# Patient Record
Sex: Female | Born: 1945 | Hispanic: No | Marital: Married | Smoking: Never smoker
Health system: Southern US, Community
[De-identification: ages and names within clinical notes are randomized; demographics above are authoritative.]

## PROBLEM LIST (undated history)

## (undated) DIAGNOSIS — I10 Essential (primary) hypertension: Secondary | ICD-10-CM

## (undated) DIAGNOSIS — Z973 Presence of spectacles and contact lenses: Secondary | ICD-10-CM

## (undated) DIAGNOSIS — Z9889 Other specified postprocedural states: Secondary | ICD-10-CM

## (undated) DIAGNOSIS — M1712 Unilateral primary osteoarthritis, left knee: Secondary | ICD-10-CM

## (undated) DIAGNOSIS — T148XXA Other injury of unspecified body region, initial encounter: Secondary | ICD-10-CM

## (undated) DIAGNOSIS — E1159 Type 2 diabetes mellitus with other circulatory complications: Secondary | ICD-10-CM

## (undated) DIAGNOSIS — Z923 Personal history of irradiation: Secondary | ICD-10-CM

## (undated) DIAGNOSIS — N1831 Chronic kidney disease, stage 3a: Secondary | ICD-10-CM

## (undated) DIAGNOSIS — R112 Nausea with vomiting, unspecified: Secondary | ICD-10-CM

## (undated) DIAGNOSIS — R600 Localized edema: Secondary | ICD-10-CM

## (undated) DIAGNOSIS — F411 Generalized anxiety disorder: Secondary | ICD-10-CM

## (undated) DIAGNOSIS — C50919 Malignant neoplasm of unspecified site of unspecified female breast: Secondary | ICD-10-CM

## (undated) DIAGNOSIS — I2699 Other pulmonary embolism without acute cor pulmonale: Secondary | ICD-10-CM

## (undated) DIAGNOSIS — Z86711 Personal history of pulmonary embolism: Secondary | ICD-10-CM

## (undated) DIAGNOSIS — J189 Pneumonia, unspecified organism: Secondary | ICD-10-CM

## (undated) DIAGNOSIS — E7800 Pure hypercholesterolemia, unspecified: Secondary | ICD-10-CM

## (undated) DIAGNOSIS — G4730 Sleep apnea, unspecified: Secondary | ICD-10-CM

## (undated) DIAGNOSIS — M1711 Unilateral primary osteoarthritis, right knee: Secondary | ICD-10-CM

## (undated) DIAGNOSIS — G510 Bell's palsy: Secondary | ICD-10-CM

## (undated) DIAGNOSIS — E119 Type 2 diabetes mellitus without complications: Secondary | ICD-10-CM

## (undated) DIAGNOSIS — E079 Disorder of thyroid, unspecified: Secondary | ICD-10-CM

## (undated) DIAGNOSIS — Z972 Presence of dental prosthetic device (complete) (partial): Secondary | ICD-10-CM

## (undated) HISTORY — DX: Personal history of irradiation: Z92.3

## (undated) HISTORY — DX: Sleep apnea, unspecified: G47.30

## (undated) HISTORY — DX: Other pulmonary embolism without acute cor pulmonale: I26.99

## (undated) HISTORY — DX: Pure hypercholesterolemia, unspecified: E78.00

## (undated) HISTORY — DX: Disorder of thyroid, unspecified: E07.9

## (undated) HISTORY — DX: Bell's palsy: G51.0

## (undated) HISTORY — DX: Personal history of pulmonary embolism: Z86.711

## (undated) HISTORY — DX: Unilateral primary osteoarthritis, right knee: M17.11

## (undated) HISTORY — DX: Malignant neoplasm of unspecified site of unspecified female breast: C50.919

## (undated) HISTORY — DX: Essential (primary) hypertension: I10

## (undated) HISTORY — DX: Type 2 diabetes mellitus without complications: E11.9

## (undated) HISTORY — DX: Pneumonia, unspecified organism: J18.9

---

## 1979-04-06 HISTORY — PX: ABDOMINAL HYSTERECTOMY: SHX81

## 1999-11-20 ENCOUNTER — Ambulatory Visit (HOSPITAL_COMMUNITY): Admission: RE | Admit: 1999-11-20 | Discharge: 1999-11-20 | Payer: Self-pay | Admitting: Internal Medicine

## 1999-11-20 ENCOUNTER — Encounter: Payer: Self-pay | Admitting: Internal Medicine

## 1999-12-02 ENCOUNTER — Encounter: Payer: Self-pay | Admitting: *Deleted

## 1999-12-02 ENCOUNTER — Inpatient Hospital Stay (HOSPITAL_COMMUNITY): Admission: EM | Admit: 1999-12-02 | Discharge: 1999-12-03 | Payer: Self-pay | Admitting: *Deleted

## 1999-12-03 ENCOUNTER — Encounter: Payer: Self-pay | Admitting: *Deleted

## 2000-11-17 ENCOUNTER — Inpatient Hospital Stay (HOSPITAL_COMMUNITY): Admission: AD | Admit: 2000-11-17 | Discharge: 2000-11-23 | Payer: Self-pay | Admitting: Internal Medicine

## 2000-11-17 ENCOUNTER — Encounter: Payer: Self-pay | Admitting: Internal Medicine

## 2000-11-17 ENCOUNTER — Encounter: Admission: RE | Admit: 2000-11-17 | Discharge: 2000-11-17 | Payer: Self-pay | Admitting: Internal Medicine

## 2003-04-09 ENCOUNTER — Encounter: Admission: RE | Admit: 2003-04-09 | Discharge: 2003-07-08 | Payer: Self-pay | Admitting: Internal Medicine

## 2003-11-29 ENCOUNTER — Ambulatory Visit (HOSPITAL_COMMUNITY): Admission: RE | Admit: 2003-11-29 | Discharge: 2003-11-29 | Payer: Self-pay | Admitting: Internal Medicine

## 2004-06-10 ENCOUNTER — Ambulatory Visit: Payer: Self-pay | Admitting: Physical Medicine & Rehabilitation

## 2004-06-10 ENCOUNTER — Inpatient Hospital Stay (HOSPITAL_COMMUNITY): Admission: RE | Admit: 2004-06-10 | Discharge: 2004-06-16 | Payer: Self-pay | Admitting: Orthopedic Surgery

## 2004-06-16 ENCOUNTER — Inpatient Hospital Stay (HOSPITAL_COMMUNITY)
Admission: RE | Admit: 2004-06-16 | Discharge: 2004-06-26 | Payer: Self-pay | Admitting: Physical Medicine & Rehabilitation

## 2004-07-06 ENCOUNTER — Inpatient Hospital Stay (HOSPITAL_COMMUNITY): Admission: EM | Admit: 2004-07-06 | Discharge: 2004-07-11 | Payer: Self-pay | Admitting: Emergency Medicine

## 2004-07-07 ENCOUNTER — Encounter (INDEPENDENT_AMBULATORY_CARE_PROVIDER_SITE_OTHER): Payer: Self-pay | Admitting: Cardiology

## 2005-01-21 ENCOUNTER — Ambulatory Visit (HOSPITAL_COMMUNITY): Admission: RE | Admit: 2005-01-21 | Discharge: 2005-01-21 | Payer: Self-pay | Admitting: Family Medicine

## 2005-04-05 DIAGNOSIS — Z86711 Personal history of pulmonary embolism: Secondary | ICD-10-CM

## 2005-04-05 HISTORY — DX: Personal history of pulmonary embolism: Z86.711

## 2005-04-05 HISTORY — PX: KNEE ARTHROPLASTY: SHX992

## 2005-04-26 ENCOUNTER — Ambulatory Visit (HOSPITAL_COMMUNITY): Admission: AD | Admit: 2005-04-26 | Discharge: 2005-04-28 | Payer: Self-pay | Admitting: Orthopedic Surgery

## 2005-05-10 ENCOUNTER — Encounter: Admission: RE | Admit: 2005-05-10 | Discharge: 2005-08-08 | Payer: Self-pay | Admitting: Orthopedic Surgery

## 2006-01-28 ENCOUNTER — Ambulatory Visit (HOSPITAL_COMMUNITY): Admission: RE | Admit: 2006-01-28 | Discharge: 2006-01-28 | Payer: Self-pay | Admitting: Family Medicine

## 2007-01-31 ENCOUNTER — Ambulatory Visit (HOSPITAL_COMMUNITY): Admission: RE | Admit: 2007-01-31 | Discharge: 2007-01-31 | Payer: Self-pay | Admitting: Family Medicine

## 2008-04-05 HISTORY — PX: THYROIDECTOMY: SHX17

## 2008-12-24 ENCOUNTER — Encounter (HOSPITAL_COMMUNITY): Admission: RE | Admit: 2008-12-24 | Discharge: 2009-03-07 | Payer: Self-pay | Admitting: Family Medicine

## 2008-12-31 ENCOUNTER — Encounter: Admission: RE | Admit: 2008-12-31 | Discharge: 2008-12-31 | Payer: Self-pay | Admitting: Family Medicine

## 2009-01-17 ENCOUNTER — Encounter: Admission: RE | Admit: 2009-01-17 | Discharge: 2009-01-17 | Payer: Self-pay | Admitting: Family Medicine

## 2009-01-22 ENCOUNTER — Encounter (INDEPENDENT_AMBULATORY_CARE_PROVIDER_SITE_OTHER): Payer: Self-pay | Admitting: Interventional Radiology

## 2009-01-22 ENCOUNTER — Other Ambulatory Visit: Admission: RE | Admit: 2009-01-22 | Discharge: 2009-01-22 | Payer: Self-pay | Admitting: Interventional Radiology

## 2009-01-22 ENCOUNTER — Encounter: Admission: RE | Admit: 2009-01-22 | Discharge: 2009-01-22 | Payer: Self-pay | Admitting: Endocrinology

## 2009-02-19 ENCOUNTER — Ambulatory Visit (HOSPITAL_COMMUNITY): Admission: RE | Admit: 2009-02-19 | Discharge: 2009-02-19 | Payer: Self-pay | Admitting: Family Medicine

## 2009-05-13 ENCOUNTER — Encounter (INDEPENDENT_AMBULATORY_CARE_PROVIDER_SITE_OTHER): Payer: Self-pay | Admitting: Surgery

## 2009-05-13 ENCOUNTER — Ambulatory Visit (HOSPITAL_COMMUNITY): Admission: RE | Admit: 2009-05-13 | Discharge: 2009-05-14 | Payer: Self-pay | Admitting: Surgery

## 2010-01-08 ENCOUNTER — Encounter: Admission: RE | Admit: 2010-01-08 | Discharge: 2010-01-08 | Payer: Self-pay | Admitting: Orthopedic Surgery

## 2010-06-24 LAB — GLUCOSE, CAPILLARY
Glucose-Capillary: 126 mg/dL — ABNORMAL HIGH (ref 70–99)
Glucose-Capillary: 139 mg/dL — ABNORMAL HIGH (ref 70–99)

## 2010-06-24 LAB — DIFFERENTIAL
Eosinophils Absolute: 0.1 10*3/uL (ref 0.0–0.7)
Lymphs Abs: 2 10*3/uL (ref 0.7–4.0)
Monocytes Relative: 9 % (ref 3–12)
Neutro Abs: 3.3 10*3/uL (ref 1.7–7.7)
Neutrophils Relative %: 55 % (ref 43–77)

## 2010-06-24 LAB — URINALYSIS, ROUTINE W REFLEX MICROSCOPIC
Ketones, ur: 15 mg/dL — AB
Nitrite: NEGATIVE
Specific Gravity, Urine: 1.031 — ABNORMAL HIGH (ref 1.005–1.030)
Urobilinogen, UA: 1 mg/dL (ref 0.0–1.0)
pH: 5.5 (ref 5.0–8.0)

## 2010-06-24 LAB — PROTIME-INR
INR: 1.11 (ref 0.00–1.49)
Prothrombin Time: 14.2 seconds (ref 11.6–15.2)

## 2010-06-24 LAB — BASIC METABOLIC PANEL
BUN: 19 mg/dL (ref 6–23)
Calcium: 9.7 mg/dL (ref 8.4–10.5)
Creatinine, Ser: 0.68 mg/dL (ref 0.4–1.2)
GFR calc Af Amer: >60 mL/min (ref >60)
GFR calc non Af Amer: >60 mL/min (ref >60)

## 2010-06-24 LAB — CBC
Platelets: 189 10*3/uL (ref 150–400)
RBC: 4.84 MIL/uL (ref 3.87–5.11)
WBC: 5.9 10*3/uL (ref 4.0–10.5)

## 2010-06-24 LAB — CALCIUM: Calcium: 8.4 mg/dL (ref 8.4–10.5)

## 2010-08-21 NOTE — Consult Note (Signed)
[name], [name]                ACCOUNT NO.:  [id number]   MEDICAL RECORD NO.:  [id number]          PATIENT TYPE:  INP   LOCATION:  5009                         FACILITY:  [facility]   PHYSICIAN:  Sherin Quarry, MD      DATE OF BIRTH:  [birth year]   DATE OF CONSULTATION:  DATE OF DISCHARGE:                                   CONSULTATION   DATE OF CONSULTATION:  June 13, 2004.   HISTORY OF PRESENT ILLNESS:  [name] is a morbidly obese, 65 year old  lady, who underwent left total knee replacement on March 8th by Dr. Thurston Hole.  Postoperatively, she has been maintained on the standard protocol, which  includes Coumadin prophylaxis.  Dr. Thurston Hole had also placed her on Cipro 500  mg b.i.d. for a three day period.  Her postoperative course has been  noteworthy for fever, which began on March 9th and reached 102 degrees.  She  had another spike on March 10th at noon of 102 degrees.  Subsequently, her  temperature has been down and several determinations have been less than  100.  In addition, she appears to be mildly hypoxic with O2 saturations in  the range of 90 to 92%.  Her main complaints are that she feels somewhat  weak.  She feels as though her heart is beating faster than usual.  She  also, of course, is experiencing pain in her left knee.  Concerns are that  we are requested to evaluate her blood sugar and also her hyponatremia.  Her  most recent serum sodium level was 126.   PAST MEDICAL HISTORY:   MEDICATIONS:  According to her husband, her usual medications are:  1.  Actos 30 mg daily.  2.  Zocor 40 mg daily.  3.  Glucophage 500 mg probably once daily.  4.  Avalide 150/12.5 one daily.   ALLERGIES:  There are no known drug allergies.   MEDICAL ILLNESSES:  1.  Hypertension:  The patient apparently has had hypertension over the last      three years.  2.  History of chest pain:  The patient was hospitalized in 2001 with      complaints of chest pain.  A stress test was done  at that time, which      was negative.  3.  Diabetes:  The patient has a one year history of diabetes, which is said      to be well regulated.  4.  Probable sleep apnea:  The patient's husband describes a history of loud      snoring with occasional breath-holding spells.  She has had sleep      studies done in the past, but has never been prescribed any treatment.  5.  History of pneumonia in August of 2002:  The patient was hospitalized      and treated for a right middle lobe pneumonia.   OPERATIONS:  She has had a previous hysterectomy.   FAMILY HISTORY:  Her mother died in a motor vehicle accident at age 88 and  had a history of MI.  Her father  died at age 55 of an MI.   SOCIAL HISTORY:  The patient does not smoke.  She denies the use of alcohol.  She does not use any drugs.  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   REVIEW OF SYSTEMS:  HEAD:  She denies headache or dizziness.  EYES:  She  denies visual blurring or diplopia.  EAR, NOSE, THROAT:  Denies earache,  sinus pain, or sore throat.  CHEST:  She denies coughing or wheezing.  She  is somewhat dyspneic with exertion.  CARDIOVASCULAR:  She denies chest pain.  She notices that her heart seems to be beating somewhat more rapidly than  usual.  There has been no orthopnea or PND.  ABDOMEN:  She denies  indigestion, heartburn, nausea, or vomiting.  GU:  She denies dysuria or  urinary frequency.  NEURO:  There is no history of seizure or stroke.  ENDO:  See above.   PHYSICAL EXAMINATION:  GENERAL:  She is a morbidly obese lady, who is  cooperative, alert, and in no acute distress.  VITAL SIGNS:  Her blood pressure is 119/71, O2 saturation is 92%, heart rate  is 105.  HEENT:  Within normal limits.  CHEST:  Clear.  Breath sounds are somewhat diminished at the bases.  CARDIOVASCULAR:  Normal S1 and S2 without rubs, murmurs, or gallops.  ABDOMEN:  Benign with normal bowel sounds.  There are no masses  or  tenderness.  No guarding or rebound.  NEUROLOGIC:  Testing is within normal limits.  EXTREMITIES:  A hematoma at the operative site.   LABORATORY DATA:  Her hemoglobin is 10.1.  Sodium is 126, potassium 3.5,  chloride 90, CO2 31.   IMPRESSION:  1.  Morbid obesity.  2.  Hypertension.  3.  History of chest pain with negative stress test.  4.  Diabetes.  5.  Status post total knee replacement, June 10, 2004, with postoperative      fever probably secondary to atelectasis.  6.  Mild hypoxia, which could possibly be chronic.  7.  History of sleep apnea.  8.  History of right middle lobe pneumonia.  9.  Status post total abdominal hysterectomy, bilateral salpingo-      oophorectomy.   PLAN:  Probably the patient's fever is secondary to postoperative  atelectasis.  I think that her hyponatremia is almost certainly secondary to  thiazide diuretic.  Will hold her hydrochlorothiazide and obtain appropriate  studies, which will include a portable chest x-ray, a catheterized urine  specimen, and electrocardiogram.  If the patient has  persistent hypoxia, consideration should be given to performing a chest CT  to rule out pulmonary embolus.  Based on her symptoms at this point, I think  this is somewhat unlikely.  She is currently receiving appropriate treatment  for her diabetes and we will continue this treatment.      SY/MEDQ  D:  06/13/2004  T:  06/14/2004  Job:  [id number]   cc:   Pam Drown, M.D.  [address]  [place]  [place] [address]  Fax: [phone]   Elana Alm. Thurston Hole, M.D.  [address]  [place] [address]  Fax: [phone]

## 2010-08-21 NOTE — H&P (Signed)
NAMECARESS, REFFITT                ACCOUNT NO.:  [id number]   MEDICAL RECORD NO.:  [id number]          PATIENT TYPE:  EMS   LOCATION:  ED                           FACILITY:  [facility]   PHYSICIAN:  Sherin Quarry, MD      DATE OF BIRTH:  June 30, 1945   DATE OF ADMISSION:  07/06/2004  DATE OF DISCHARGE:                                HISTORY & PHYSICAL   HISTORY OF PRESENT ILLNESS:  [name] is a morbidly obese 65 year old  lady who underwent total knee replacement on March 8 by Dr. Thurston Hole.  She was  maintained on the usual Coumadin prophylaxis and her postoperative course  was marked by persistent fever, hypoxia, and tachycardia.  Subsequently a CT  scan of the chest was obtained which diagnosed an extensive pulmonary  embolus.  Because of this the patient was placed on heparin and Coumadin  therapy was begun.  The patient did well and was discharged from the  hospital on March 24.  Since that time it has been somewhat difficult to  regulate her prothrombin time.  There has been a tendency for INRs to be  subtherapeutic and significant adjustments in the Coumadin have been  required.  The patient states that she has been making good progress with  physical therapy; however, for the last 24 hours she states that she has  been extremely short of breath.  She says that she is short of breath with  very minimal walking and has been having episodes of shortness of breath  just sitting or lying in bed.  She presented to Dr. Corliss Blacker with these  complaints and was noted to have a blood pressure of 90/60.  She was  therefore advised to come to the [facility] Emergency Room.  Since arriving  in the emergency room she has been sent for a CT scan of the chest to make  sure that she has not had another pulmonary embolus.  This study shows that  the previous pulmonary embolus has cleared substantially and there is no  evidence of a new pulmonary embolus.  In addition to the above complaints  the  patient says she has been having intermittent chills and diaphoresis.  Her temperature has been carefully monitored and has been consistently in  the range of 97-98.  Her knee seems to feel better each day and there has  been no drainage from the knee.  She does not complain of cough or chest  pain.  She says that her appetite is decreased and she has felt nauseated.  Because of concern about persistent dyspnea the patient is admitted to the  hospital at this time.   PAST MEDICAL HISTORY:   MEDICATIONS:  1.  Actos 30 mg daily.  2.  Zocor 40 mg daily.  3.  Glucophage 500 mg a.c. breakfast and supper.  4.  Avalide 150/12.5 one daily.  5.  __________one daily.   ALLERGIES:  She states that she has no known drug allergies.   MEDICAL ILLNESSES:  1.  Hypertension.  This has been probably over the last three years.  2.  History of chest pain.  The patient was hospitalized in 2001 with      complaints of chest pain.  A stress test was done at that time and was      negative.  3.  Diabetes.  The patient has a one-year history of diabetes which is said      to be well-regulated.  4.  Probable sleep apnea.  The patient's husband reports that she has a      history of snoring and breath holding spells.  She has had sleep studies      done in the past but is not sure about the results of these tests.  5.  History of pneumonia.  She has a history of pneumonia in August of 2002.   OPERATIONS:  She had a previous hysterectomy.   FAMILY HISTORY:  Her mother died in a motor vehicle accident and had a  history of an MI.  Her father died at age 6 of an MI.   SOCIAL HISTORY:  She does not smoke.  She denies use of alcohol and she does  not use any drugs.  She works for [employer] [place].   REVIEW OF SYSTEMS:  HEAD:  She denies headache or dizziness.  EYES:  She  denies visual blurring or diplopia.  EARS, NOSE, THROAT:  She denies  earaches, sinus pain, or sore throat.  CHEST:  See above.   CARDIOVASCULAR:  See above.  GASTROINTESTINAL:  She denies abdominal pain.  She has not been  vomiting.  GENITOURINARY:  She denies dysuria or urinary frequency.  NEUROLOGIC:  There is no history of seizure or stroke.  ENDOCRINE:  See  above.   PHYSICAL EXAMINATION:  VITAL SIGNS:  Temperature 96.1, blood pressure  112/57.  This is after having received a total of 3 L of normal saline.  When she first arrived in the emergency room her blood pressure was  apparently 90/60.  HEENT:  Exam is within normal limits.  CHEST:  Clear.  There are no rubs, no rhonchi, no wheezes.  CARDIOVASCULAR:  Reveals normal S1 and S2 without rubs, murmurs, or gallops.  ABDOMEN:  Benign.  There are normal bowel sounds without masses, tenderness,  or organomegaly.  NEUROLOGIC:  Testing is within normal limits.  EXTREMITIES:  To my eye the left knee looks good.  There is some ecchymosis.  There is no drainage.  There is no redness of the incision.  The degree of  swelling appears normal for this postoperative knee.   IMPRESSION:  1.  Acute complaint of dyspnea over the last 24 hours, apparently severe.      This does not seem to be secondary to a recurrent pulmonary embolus.      Need to consider whether this could be a reflection of some other      pulmonary process or a cardiac event.  We should rule out myocardial      infarction, obtain BNP, and also 2-D echo.  2.  Transient hypotension, probably secondary to blood pressure medications.  3.  Recent total knee replacement.  4.  Diabetes.  5.  Massive obesity.  6.  Hypertension.  7.  History of pulmonary embolus, March 6, status post total knee      replacement, Coumadin therapy with suboptimal INR.  8.  Sleep apnea.  9.  History of pneumonia.   PLAN:  The plan will be to admit the patient to observe her status.  Will  place  her on telemetry.  As mentioned above we will obtain serial cardiac enzymes as well as BNP.  A 2-D echo will be obtained.  The  patient will be  placed transiently on Lovenox because her INR is subtherapeutic.  Venous  Doppler studies will be obtained of the lower extremities.  I will ask  physical therapy to reevaluate her.  Dr. Thurston Hole will be informed of the  patient's admission.      SY/MEDQ  D:  07/06/2004  T:  07/06/2004  Job:  [id number]   cc:   Pam Drown, M.D.  [address]  [place]  [place] [address]  Fax: [phone]   Elana Alm. Thurston Hole, M.D.  [address]  [place] [address]  Fax: [phone]

## 2010-08-21 NOTE — Discharge Summary (Signed)
[place]  Patient:    [name], [name] Visit Number: [id number] MRN: [id number]          Service Type: MED Location: [phone] Attending Physician:  Julieanne Manson Adm. Date:  11/17/2000 Disc. Date: 11/23/00                             Discharge Summary  DATE OF BIRTH:  06-08-1945  ADMISSION DIAGNOSES: 1. Right middle lobe and lower lobe pneumonia. 2. Hypertension. 3. Glucose intolerance. 4. Obesity.  DISCHARGE DIAGNOSES: 1. Right middle lobe and lower lobe pneumonia. 2. Hypertension. 3. Glucose intolerance. 4. Obesity.  HISTORY OF PRESENT ILLNESS:  This is a 65 year old female with a history of hypertension, obesity, peripheral ______ who presented to the office two days prior to admission with a one week history of cough productive of thick yellow-green sputum, fevers to 103, chills, and shortness of breath.  Patient was initiated on Ceftin and on followup two days later was minimally improved with saturations of 89% on room air.  White blood count was 9.9 with 78% neutrophils.  Electrolytes were essentially normal save for a glucose of 213. Chest x-ray showed a right middle lobe and right lower lobe infiltrate. Because of her oxygen requirement and poor respiratory status, the patient was admitted.  On examination she was found to have crackles in the right anterior chest as well as right upper lung field posteriorly that was different from her examination two days earlier.  Patient was placed on IV fluids, IV Tequin, albuterol nebulizations p.r.n., and oxygen.  With regards to her pneumonia, she progressed relatively slowly.  Sputum returned positive for Gram positive cocci in pairs, chains, and also Gram positive cocci in clusters.  There was no growth.  The sputum, however, was obtained after being on Ceftin for two days.  After rehydration patient eventually developed wheezing and she was initiated on corticosteroids  with significant improvement in her status. Today at time of discharge she is saturating 97% at rest, 94% when up and moving around.  Her cough is much improved and is only productive now of clear to white sputum.  She feels much less short of breath.  With regards to her hyperglycemia, some of this is felt secondary to stress, though suspect that she does have a propensity to develop diabetes mellitus. She did receive nutritional counseling while in the hospital and does feel she is now more knowledgeable on diet changes and when she is breathing better will get back to exercise.  She did receive insulin Regular sliding scale during the hospitalization.  At discharge she will be placed on Glucotrol XL 5 mg and will follow-up in a couple of days to see how her sugars are doing. Third concern was that of hypertension and that has been stable throughout her hospitalization.  She continues on Dyazide.  Also with regards to cardiovascular status, patient was initiated on ______ for DVT prophylaxis throughout the hospitalization as well.  She has been ambulating.  DISCHARGE MEDICATIONS: 1. Dyazide one p.o. q.d. 2. Tequin 400 mg q.d. for eight more days for a total of 15 days of    antibiotics. 3. Humibid LA one p.o. b.i.d. 4. Glucotrol XL 5 mg q.d. 5. Albuterol MDI two puffs q.4h. p.r.n. wheezing. 6. Prednisone 10 mg tablets.  She is to take two tablets tonight to complete    40 mg today and then decrease by  one-half tablet or 5 mg q.d. until off    which will last approximately eight days.  DIET:  1600 ADA low fat diet.  DISCHARGE INSTRUCTIONS:  Call Dr. Annye Asa office for followup in two days ([phone]).  Let us know if she has any problems before that time.  We did discuss symptoms of significant hyperglycemia.  Patient has been running in the high 100s-300s with CBGs since being initiated on steroids despite her insulin sliding scale. Attending Physician:  Julieanne Manson DD:   11/23/00 TD:  11/23/00 Job: [id number]

## 2010-08-21 NOTE — Discharge Summary (Signed)
[name], [name]                ACCOUNT NO.:  [id number]   MEDICAL RECORD NO.:  [id number]          PATIENT TYPE:  INP   LOCATION:                               FACILITY:  [facility]   PHYSICIAN:  Robert A. Thurston Hole, M.D. DATE OF BIRTH:  June 25, 1945   DATE OF ADMISSION:  06/10/2004  DATE OF DISCHARGE:  06/16/2004                                 DISCHARGE SUMMARY   ADMISSION DIAGNOSES:  1.  End-stage degenerative joint disease, left knee.  2.  Hypertension.  3.  Diabetes.   DISCHARGE DIAGNOSES:  1.  End-stage degenerative joint disease, left knee.  2.  Hypertension.  3.  Diabetes.  4.  Pulmonary embolism.  5.  Hypoxemia.  6.  Morbid obesity.   HISTORY OF PRESENT ILLNESS:  The patient is a 65 year old white female with  a history of end-stage DJD of her left knee. She has tried conservative care  including cortisone injections and Synvisc without success. She understands  the risks, benefits, and possible complications of a left total knee  replacement and is without question.   PROCEDURES IN-HOUSE:  On June 10, 2004, the patient underwent a left total  knee replacement as well as a femoral nerve block. She tolerated the  procedure well. She was admitted for pain control, DVT prophylaxis, and  physical therapy. On postoperative day one the patient was doing okay. Blood  pressure was 129/84 with pulse of 107, hemoglobin 12.9. Urinalysis showed  greater than 100,000 bacteria, a mixture of pathogens. She was started on  Cipro 500 mg p.o. b.i.d. for this. She was given an IV fluid bolus due to  some decreased renal function and a rehab consult was made for SACU  placement versus skilled nursing facility placement. She has a disabled  husband and was unable to go home because nobody was there to take care of  her. On postoperative day two the patient was doing well, although pain  control was an issue. Her PCA was restarted with T-max of 102.7, pulse 129,  blood pressure 103/59 with  hemoglobin of 11.4. Renal function was improved  with a BUN of 16 and a creatinine of 0.9. Recheck on the vitals showed a  blood pressure of 131/76, pulse 119, O2 saturation 94% on two liters,  temperature 99. Left knee wound was well approximated. She had a small  amount of bloody drainage. She was started on OxyContin 20 mg p.o. b.i.d.  Her PCA was once again discontinued and her Foley was discontinued. Her IV  was  hep locked. On postoperative day three, the patient was hyponatremic  with a sodium of 126, and her CBGs were high with a blood glucose of 169.  Surgical wound was well approximated. [facility] hospitalist was consulted for  her low sodium and high CBGs. Her hydrochlorothiazide was held due to her  hyponatremia. Her O2 sats were followed. Repeat chest x-ray was done as well  as an UA and an EKG. Her Coumadin was continued as well as her sliding  scale. Chest x-ray showed substantial atelectasis. EKG showed a normal sinus  rhythm with  O2 saturation of 90% on three liters. Spiral CT was done that  did show pulmonary embolus. She was on heparin. [facility] hospitalist managed  it. PT was held until she was properly anticoagulated. She will continue on  her CPM. On postoperative day five, the patient was better, she had no  shortness of breath. Blood pressure was 152/70, temperature 96, pulse 82,  hemoglobin 9.6, INR 2.6. She was slightly hypokalemic with a potassium of  3.2.  She had a small amount of serous drainage from her left knee. CT was  resumed. CPM was continued.  Doppler was ordered to find out if she had a  DVT, if the source of her pulmonary embolus. Medicine continued to follow  and replenished her potassium. On postoperative day six, her INR was 3.2,  she had a small amount of serous drainage, and she was afebrile. Her  dressing was changed. A bed was available in rehab, approved by [facility]. She was transferred to rehab in stable condition, weight  bearing as  tolerated, on a regular diet, on Coumadin, OxyContin 20 mg  b.i.d., Percocet one to two q.4h. p.r.n. breakthrough pain, Actos 30 mg p.o.  daily, Glucophage 500 mg p.o., Voltaren 75 mg p.o. b.i.d., Avalide 150  mg/12.5 p.o. daily, Zocor 40 mg p.o. daily. We will continue to follow her  on rehab. She is on weight bearing as tolerated, on a regular diabetic diet.      Kirstin Shepperson, P.A.      Robert A. Thurston Hole, M.D.  Electronically Signed    KS/MEDQ  D:  12/03/2004  T:  12/03/2004  Job:  [id number]

## 2010-08-21 NOTE — Discharge Summary (Signed)
[name], [name]                ACCOUNT NO.:  [id number]   MEDICAL RECORD NO.:  [id number]          PATIENT TYPE:  INP   LOCATION:  0366                         FACILITY:  [facility]   PHYSICIAN:  Melissa L. Ladona Ridgel, MD  DATE OF BIRTH:  [birth year]   DATE OF ADMISSION:  07/06/2004  DATE OF DISCHARGE:  07/11/2004                                 DISCHARGE SUMMARY   ADDENDUM:  The patient was given her Toprol XL at approximately noon.  She  tolerated the medication well.  Heart rate came down into the 90s.  Blood  pressure initially was documented, this being in the high 90s over 60s.  Repeat value prior to discharge showed a blood pressure of 143 systolically.  She therefore was cleared for discharge to home.  She has a blood pressure  cuff at home and will monitor her blood pressure.  I have requested that she  hold her Avalide which was a change from the previous discharge summary  until she sees Dr. Corliss Blacker.      MLT/MEDQ  D:  07/11/2004  T:  07/11/2004  Job:  [id number]   cc:   Pam Drown, M.D.  [address]  [place]  [place] [address]  Fax: [phone]

## 2010-08-21 NOTE — H&P (Signed)
[place]. [facility]  Patient:    YANAI, HOBSON                      MRN: [id number] Adm. Date:  12/02/99 Attending:  Meade Maw, M.D. Dictator:   Anselm Lis, N.P. CC:         Hal T. Stoneking, M.D.   History and Physical  DATE OF BIRTH:  05-Feb-1946  HISTORY OF PRESENT ILLNESS:  Ms. Liao is a very pleasant 65 year old obese female with a positive family history for coronary artery disease.  She awoke at 3:30 a.m. this morning with an ache in her left neck, which over the course of an hour traveled to her left shoulder.  She took 400 mg of Advil without improvement in the discomfort, but had subsequent nausea.  She noted that when neck and arm pain were exacerbated, so was the feeling of nausea.  She did vomit.  She took a shower and had an episode of severe nausea and subsequent very severe arm and shoulder pain, as well as feeling diaphoretic.  She took an additional Advil about 7:30 a.m. this morning, with some improvement in the discomfort, with residual shoulder and left arm pain, as well as nausea and diaphoresis.  She went on to work, where she continued with lightheadedness and nausea, and subsequently presented to the primary care Jaquane Boughner who administered an single sublingual nitrate, with marked improvement of all of her symptoms within three minutes.  She was also given four baby aspirin.  An electrocardiogram done with the discomfort was normal.  She was transferred to the [facility] Emergency Room where she arrived pain-free. She may have had some transient left arm achiness, but otherwise was okay. Her first set of enzymes are negative.  Her electrocardiogram remains normal, without ischemic changes.  CARDIAC RISK FACTORS:  Obesity, age, positive family history for coronary artery disease.  PAST MEDICAL HISTORY: 1. Hysterectomy in 1981. 2. Tonsillectomy as a child. 3. Obesity.  ALLERGIES:  No known drug  allergies.  CURRENT MEDICATIONS:  None.  SOCIAL HISTORY/HABITS:  Tobacco:  Remote light use.  ETOH:  Negative significant.  Caffeine:  Negative.  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  She is married for 35 years.  She has four sons who are alive and well.  FAMILY HISTORY:  Mother died in a motor vehicle accident at age 58.  Her mom had a heart attack at age 44.  Father died at age 53 of a myocardial infarction.  He had his first heart attack in the 50s-60s.  REVIEW OF SYSTEMS:  As in the HPI and past medical history, otherwise denies a problem with lightheadedness, dizziness, syncope, or near syncopal episodes. Negative problems with hearing or vision.  Negative dysphagia to food or fluid.  Denies history of GERD.  No problems with constipation or diarrhea. No recent flu-like symptoms.  Denies dysuria or hematuria.  She does have some dependent-type pedal edema.  She is on a diet program involving drinking two quarts of water a day.  She has had no history of anginal complaints.  Denies shortness of breath, DOE, orthopnea, or PND.  PHYSICAL EXAMINATION:  VITAL SIGNS:  Blood pressure 168/86, heart rate 30 and regular, respirations 18.  She is afebrile.  Her room air O2 saturation 99%.  GENERAL:  She is an obese pleasantly-conversant female, in no acute distress. Her family are in attendance.  NECK:  Brisk bilateral carotid upstroke without bruits.  No significant jugular venous distention or thyromegaly.  CHEST:  Lung sounds clear with equal bilateral excursion.  Negative CTA tenderness.  CARDIAC:  A regular rate and rhythm, without appreciable murmur or rub or gallop.  Normal S1, S2.  ABDOMEN:  Soft, obese, normoactive bowel sounds.  Negative abdominal aortic, renal, or femoral bruits appreciated.  No masses or organomegaly appreciated, though a difficult examination secondary to obesity.  EXTREMITIES:  With +2/4 bilateral radial,  dorsalis pedis, and posterior tibial pulses.  I was unable to really readily identify the femoral pulses, but there was no bruit in that area.  She does have some lower extremity swelling, nonpitting.  NEUROLOGIC:  Cranial nerves II-XII grossly intact.  Alert and oriented x 3.  GENITOURINARY/RECTAL:  Examinations deferred.  LABORATORY DATA:  CPK 92, MB fraction 0.8, troponin I less than 0.03.  ______ within normal limits.   Sodium 140, K of 4.2, chloride 103, CO2 of 26, BUN 14, creatinine 0.7, glucose 118.  Hemoglobin 14.3, hematocrit 43.5, WBC 8.3, platelets 239.  Pro time 14.4, INR 1.2, PTT 30.  Electrocardiogram revealed normal sinus rhythm without ischemic changes. Nonspecific T-wave abnormality in V2 and V3.  IMPRESSION: 1. Typical anginal presentation in this obese 65 year old female who does have    a marked positive family history for coronary artery disease.  Of note, all    her symptoms of arm, neck, nausea, and clamminess were markedly improved    with sublingual nitrate.  Her electrocardiogram with and without discomfort    are essentially unchanged, and are without ischemic changes.  Her first set    of cardiac enzymes are negative.  She is currently pain-free. 2. Obesity.  PLAN: 1. Admit to telemetry.  Rule out myocardial infarction protocol with    serial cardiac enzymes and daily electrocardiogram.  Enteric-coated    aspirin once q.d. as well as proton pump inhibitor. 2. Other plans pending Dr. Elyse Hsu review. DD:  12/02/99 TD:  12/02/99 Job: [id number]

## 2010-08-21 NOTE — Op Note (Signed)
[name], [name]                ACCOUNT NO.:  [id number]   MEDICAL RECORD NO.:  [id number]          PATIENT TYPE:  OIB   LOCATION:  5004                         FACILITY:  [facility]   PHYSICIAN:  Robert A. Thurston Hole, M.D. DATE OF BIRTH:  1946-03-27   DATE OF PROCEDURE:  04/26/2005  DATE OF DISCHARGE:                                 OPERATIVE REPORT   PREOPERATIVE DIAGNOSIS:  Left total knee arthrofibrosis.   POSTOPERATIVE DIAGNOSIS:  Left total knee arthrofibrosis.   OPERATION PERFORMED:  1.  Left total knee examination under anesthesia followed by manipulation.  2.  Left total knee arthroscopic lysis of adhesions with arthroscopic      debridement and lateral retinacular release.   SURGEON:  Elana Alm. Thurston Hole, M.D.   ASSISTANT:  Julien Girt, P.A.   ANESTHESIA:  Local and MAC.   OPERATIVE TIME:  40 minutes.   COMPLICATIONS:  None.   INDICATIONS FOR PROCEDURE:  [name] is a 65 year old woman who has had  significant left total knee arthrofibrosis and stiffness.  She is 10 months  status post left total knee replacement.  She has failed conservative care  and is now to undergo manipulation and arthroscopic lysis of adhesions and  debridement.  She developed a post total knee replacement pulmonary embolus  and will be treated closely with Coumadin and Lovenox monitoring and  postoperative monitoring as well.   DESCRIPTION OF PROCEDURE:  [name] was brought to the operating room on  April 26, 2005 after a knee block was placed in the holding room by  anesthesia.  She was placed on the operating table in supine position.  She  received Ancef 2 g IV preoperatively for prophylaxis.  Under MAC anesthesia,  initial range of motion of the knee was tested.  She had range of motion  from -15 to 85 degrees.  A gentle manipulation and flexion was carried out  improving flexion to 105 degrees.  Extension remained unaffected by the  manipulation.  Her left leg was then prepped  using sterile DuraPrep and  draped using a sterile technique.  Originally through an anterolateral  portal, the arthroscope with a pump attached was placed and through an  anteromedial portal, an arthroscopic probe was placed.  On initial  inspection, the total knee components were found to be well seated and in  excellent position.  She was found to have significant adhesions and scar  tissue and synovitis anteriorly, anteromedially and anterolaterally and this  was thoroughly debrided arthroscopically.  A lateral retinacular release was  also carried out with an ArthroCare wand.  No excessive bleeding was  encountered.  After this thorough debridement in the medial lateral gutters  and the anterior compartment and suprapatellar pouch, no other pathology was  noted.  Extension improved to approximately -5 to 7 degrees which was a 50%  improvement.  Flexion had also improved to 105 degrees with normal patellar  tracking.  At this point the arthroscopic instruments were removed.  The  portals were closed with 3-0 nylon suture.  Sterile dressings were applied  and the patient awakened  and taken to recovery room in stable condition.  Needle and sponge counts correct x2 at the end of the case.   FOLLOW-UP CARE:  [name] will be followed for observation for the next 24  to 48 hours for close postoperative monitoring due to her previous pulmonary  embolus.  She will be restarted on Lovenox.  She will be seen back in the  office in a week after discharge with early intensive physical therapy.      Robert A. Thurston Hole, M.D.  Electronically Signed     RAW/MEDQ  D:  04/26/2005  T:  04/27/2005  Job:  [id number]

## 2010-08-21 NOTE — H&P (Signed)
[place]. [facility]  Patient:    [name], [name]                       MRN: [id number] Adm. Date:  [id number] Attending:  Julieanne Manson                         History and Physical  DATE OF BIRTH:  2045-11-04  CHIEF COMPLAINT:  Productive cough, fever, and chills.  HISTORY OF PRESENT ILLNESS:  This is a 65 year old white female with a history of hypertension, obesity, and peripheral edema, who presents with approximately a one-week history of a cough productive of a thick, yellow-green sputum, fevers to 103 degrees, chills, and shortness of breath. The patient was seen for the same in the office on November 15, 2000.  The lungs at that time and she was diagnosed with an acute bronchitis/pneumonia.  The patient was not interested in having a chest x-ray done at that point as she did not want to drive elsewhere to have it done.  She was initiated on Ceftin 250 mg p.o. b.i.d. and Phenergan with codeine.  The patient returned for scheduled follow-up today with little improvement and was noted to have diffuse crackles in the right upper lung field posteriorly and the entire anterior right lung field.  Sputum today showed gram-positive cocci in pairs and chains and also gram-positive cocci in clusters with abundant PMNs.  The chest x-ray showed right middle lobe and right upper lobe infiltrates.  The white count was 9.9 with 78% neutrophils.  The hemoglobin was hemoconcentrated at 16.8 and platelets 240.  The sodium was mildly low at 133, potassium 4.6, glucose 213, BUN 13, and creatinine 1.0.  The patient states that she has been drinking fluids well.  One episode of vomiting yesterday with cough, otherwise no GI symptoms.  CURRENT MEDICATIONS: 1. Dyazide 1 mg p.o. q.d. 2. Ceftin 250 mg p.o. b.i.d. 3. Phenergan with codeine.  The patient did not tolerate the Phenergan.  It    "wired" her.  ALLERGIES:  No known drug allergies.  HABITS:  Tobacco:  The  patient smoked over several years, one to two cigarettes about a week.  She quit five to six years ago.  Alcohol:  Minimal.  PAST MEDICAL HISTORY: 1. Hospitalized for chest pain in August of 2001 that was felt to be    noncardiac.  She did undergo a Cardiolite stress test that was negative.    She was ruled out for an MI. 2. Hypertension. 3. Obesity. 4. Peripheral edema.  PAST SURGICAL HISTORY: 1. T&A as a child. 2. In 1981, TAH and BSO.  FAMILY HISTORY:  Father died at age 21 of renal cell carcinoma.  He also had diabetes mellitus, hypertension, and obesity.  Mother died at age 73 in a moving vehicle accident.  She also had hypertension, MI x 2, and obesity.  A brother, age 22, with hypertension.  A sister, age 54, with MS.  Four children.  A 43 year old son with sarcoidosis.  He is a patient of Pearla Dubonnet, M.D.  The other three children are healthy.  SOCIAL HISTORY:  Married for 35 years.  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  REVIEW OF SYSTEMS:  Noncontributory.  The patient denies any melena or hematochezia.  PHYSICAL EXAMINATION:  The temperature in the office today was 99.8 degrees, however, she had taken Tylenol earlier.  Pulse 108 and blood  pressure 142/90. Her weight was down 7 pounds from two days earlier.  SAO2 was 89% on room air.  The respiratory rate was 25.  HEENT:  Pupils equal, round, and reactive to light.  Extraocular movements intact.  Tympanic membranes are clear.  Throat without injection.  Nontender over her sinuses.  NECK:  Supple without adenopathy.  No thyromegaly.  CHEST:  Crackles in the right upper lung fields postoperatively and right anterior chest diffusely.  The left side was clear.  CARDIOVASCULAR:  Regular rate and rhythm with normal S1 and S2.  No S3, S4, or murmur appreciated.  No carotid bruits.  Carotid, radial, femoral, and DPP pulses normal, dynamic, and equal.  ABDOMEN:  Obese.  Bowel sounds present throughout.   Nontender.  No organomegaly or masses appreciated.  MUSCULOSKELETAL:  The patient was tender in her paraspinous musculature of the lumbosacral spine.  She states that this had become worse with her cough.  EXTREMITIES:  Lower extremities with a scant amount of edema.  ASSESSMENT AND PLAN: 1. Right upper lobe and right middle lobe pneumonia.  Will change the patient    to Tequin IV q.24h.  Oxygen p.r.n.  This has certainly made the patient    feel significantly more comfortable.  Will treat with albuterol nebulizers    p.r.n. if she has wheezing or difficulty producing her sputum.  Tylenol    with codeine p.r.n. for cough and also for pain.  No Phenergan for now.  IV    fluids for rehydration and thinning of mucus.  Will repeat BMET and CBC in    the morning. 2. Elevated glucose.  CBGs q.a.c. and h.s.  I suspect that this is just stress    related. 3. Hypertension.  This is currently stable.  Holding her Dyazide as we    rehydrate her. DD:  11/17/00 TD:  11/17/00 Job: [id number]

## 2010-08-21 NOTE — Discharge Summary (Signed)
[name], [name]                ACCOUNT NO.:  [id number]   MEDICAL RECORD NO.:  [id number]          PATIENT TYPE:  INP   LOCATION:  0366                         FACILITY:  [facility]   PHYSICIAN:  Melissa L. Ladona Ridgel, MD  DATE OF BIRTH:  1945/12/16   DATE OF ADMISSION:  07/06/2004  DATE OF DISCHARGE:  07/10/2004                                 DISCHARGE SUMMARY   DISCHARGING DIAGNOSES:  1.  Acute shortness of breath in the face of recent pulmonary embolus.  The      patient was reevaluated for recurrent pulmonary emboli and showed no      evidence; however, her Coumadin levels were suboptimal.  She was      admitted to the hospital to evaluate her low blood pressure as well as      acute onset of shortness of breath.  Her shortness of breath resolved      with minimal intervention.  Her blood pressure was treated with a fluid      bolus, and we evaluated her for alternative reasons for shortness of      breath, namely a myocardial infarction which all of her cardiac enzymes      were negative.  Her BNP was within normal limits, and her 2 D      echocardiogram showed no functional deficits.  Her ejection fraction was      55-65% with mildly increased left ventricular wall thickness.  She also      had an increased atrial contraction contribution to the left ventricular      filling, and there was some evidence for possible abnormal left      ventricular relaxation consistent with diastolic dysfunction.  2.  Hypotension.  The patient was volume resuscitated.  Her hypertensive      medications were held, and she returned to normotensive state without      further intervention.  Her chest x-ray showed no obvious infiltrate and      no obvious source of her infection that could be causing this, so it was      therefore felt to be secondary to taking her medications and being      dehydrated.  3.  Supraventricular tachycardia.  Please note during the hospital stay that      every period of  supraventricular tachycardia was noted on her EKG      monitor.  She was evaluated by cardiology, who recommended starting      Toprol low dose for a period of several weeks which could then be      reexamined at a later time.  It was felt that perhaps it is possible      that she had an episode of supraventricular tachycardia which caused her      shortness of breath outside the hospital.  4.  Hyperlipidemia.  She will continue with her Zocor.  5.  Diabetes.  She will continue with her oral agents, Actos and Glucophage.  6.  Recent pulmonary embolus.  The patient's INR at discharge is 2.4.  This  is after a series of Coumadin boluses of 7.5, 10, and 7.5.  We will      discharge her to home with overlap therapy for her Lovenox consisting of      3 injections to cover her until she has 2 consecutive days of      appropriate INR.  I will discharge her to home on 5 mg of Coumadin and,      as stated, 3 more injections of her Lovenox.  She should follow up next      week with Dr. Corliss Blacker for her PT/INR as well as blood pressure and pulse      check.  7.  Left knee replacement.  The patient is finding that her knee is stiff,      but she appears to be doing well.  We appreciated Dr. Sherene Sires service      stopping by to see her.  She will restart home therapy with [facility] on      Monday and see Dr. Thurston Hole on Tuesday.  8.  Obesity and possible sleep apnea.  The patient will follow up as an      outpatient with Dr. Corliss Blacker and consider a sleep study as an outpatient.  9.  History of hypertension on Avalide.  As stated above, this was held      because of starting her on Lopressor.  I would like to start with a      lower dose of her Avalide according to our pharmacy that that can be cut      in half.  I will therefore recommend half a tablet until we see what the      combined effects of the Avalide and Lopressor are.   DISCHARGE MEDICATIONS:  1.  Coumadin 5 mg q.h.s.  2.  Metformin 500 mg  b.i.d.  3.  Actos 30 mg daily.  4.  Zocor 40 mg q.h.s.  5.  Lovenox 145 mg q.12h. x 3 more doses.  6.  Miralax 17 g in 8 ounces of water p.o. daily.  7.  Darvocet-N 100/650 mg p.o. q.4-6h. p.r.n. for pain.  8.  Robaxin 500 mg p.r.n. for spasm.  9.  She can resume her Ultram 50 mg 1 tab q.i.d. p.r.n.  10. Protonix 40 mg daily.   HISTORY OF PRESENT ILLNESS:  The patient is a 65 year old white female, who  underwent a total knee replacement on March 8.  Her course was complicated  by persistent fever, hypoxia, and tachycardia.  CT of the chest was  undertaken and showed that she has extensive pulmonary emboli.  The patient  was subsequently fully anticoagulated and discharged from the hospital on  March 24 to rehab.  Since discharge, it has been difficult to regulate her  pro time.  She found that her INRs tended to be subtherapeutic and required  significant adjustments in her Coumadin.  The patient was progressing in her  physical therapy but in 24 hours prior to admission found that she was more  short of breath than she had been.  On the day of admission, she had  increasing shortness of breath with minimal walking and therefore presented  to Dr. Darrell Jewel office for further evaluation.  In the office, she was  found to have a blood pressure of 90/60.  She was therefore advised to come  to the emergency room.  A repeat CT of the chest found no recurrence of the  emboli.  In fact, there was significant  clearing of the previous emboli.  The patient had complained of chills and diaphoresis, however, has never had  a temperature during the hospital course, nor did she register one at home.  The patient was therefore admitted to the telemetry floor to rule out  myocardial infarction and to evaluate other reasons for her shortness of  breath.  Her blood pressure medications were held, and her volume status was resuscitated with IV fluids.  The patient tolerated resuscitative efforts  well,  and her blood pressures came up within normal limits.  She has been  off of her Avalide since admission and at present does have slightly  elevated blood pressure in the 150s.  Of note, she has remained tachycardic  throughout this hospital stay as well as the previous hospital stay and in  reviewing her telemetry monitoring, it looks as if she has had intermittent  runs of SVT.  She was evaluated by cardiology who recommended starting  Toprol daily and reevaluating in several weeks.  They have also recommended  that she have a sleep study because they feel that sleep apnea may be  playing a role as well as deconditioning in some of shortness of breath.  On  the day of discharge, the patient will be given her first dose of Toprol and  monitored for several hours.  As long as she is stable, we will discharge  her to home, as she is able to monitor her own blood pressure and pulse  there and does have home health that can come in as well.   PHYSICAL EXAMINATION:  VITAL SIGNS ON THE DAY OF DISCHARGE:  Temperature is  97.9 with a blood pressure of 151/95.  Heart rate has ranged from 101-106.  Her respiratory rate is 20.  Saturations are 93% on room air.  GENERAL:  Well-developed, well-nourished white female in no acute distress.  HEENT:  Pupils are equal, round, and reactive to light.  Extraocular muscles  are intact.  Mucous membranes are moist.  NECK:  Supple.  There is no JVD, no lymph nodes, and no carotid bruits.  CHEST:  Clear to auscultation with decreased breath sounds at the bases.  CARDIOVASCULAR:  Tachycardic.  Positive S1, S2.  No S3, S4.  No murmurs,  rubs, or gallops are noted.  ABDOMEN:  Obese, nontender, nondistended with positive bowel sounds.  EXTREMITIES:  2+ pulses at her left knee.  Clean, dry, and intact.  There is  no erythema and minimal edema.  NEUROLOGIC:  She is nonfocal.   PERTINENT LABORATORY VALUES DURING COURSE OF HOSPITAL STAY:  Her blood  glucoses have  ranged from 104-120.  Her discharging hemoglobin is 10.1 with  a hematocrit of 30.3.  White count is 4.1 with a platelet count of 210.  Her  discharging BUN is 8 with a creatinine of 0.7.  Potassium is 3.7.  LFTs are  within normal limits.  She has a mildly decreased albumin at 3.2.  Cardiac  enzymes were negative during the course of the hospital stay.  Blood  cultures were negative.  Urine culture had contamination.  Her brain  natriuretic peptide was less than 30.  Her TSH was 2.28.  Her D-dimer was  elevated at 1.84.   At this time, after receiving Toprol, as long as the patient tolerates this  from a blood pressure and heart rate standpoint, she will be discharged to  home to follow up with her home health team, resume her home physical  therapy,  and follow up with her primary care physician early next week to  check her PT/INR and to see her orthopedist regarding recovery of her knee.  Addemdum: Please note the Patient was started on Toprol XL 25 mg which she  tolerated prior to discharged.  We did ask her to hold her Avalide until  following up with Dr. Uvaldo Rising      MLT/MEDQ  D:  07/11/2004  T:  07/11/2004  Job:  [id number]   cc:   Molly Maduro A. Thurston Hole, M.D.  [address]  [place] [address]  Fax: [phone]   Pam Drown, M.D.  [address]  [place]  [place] [address]  Fax: [phone]

## 2010-08-21 NOTE — Discharge Summary (Signed)
[name], [name]                ACCOUNT NO.:  [id number]   MEDICAL RECORD NO.:  [id number]          PATIENT TYPE:  INP   LOCATION:  5004                         FACILITY:  [facility]   PHYSICIAN:  Robert A. Thurston Hole, M.D. DATE OF BIRTH:  29-Aug-1945   DATE OF ADMISSION:  04/26/2005  DATE OF DISCHARGE:  04/28/2005                                 DISCHARGE SUMMARY   ADMISSION DIAGNOSES:  1.  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  2.  Diabetes.  3.  History of a pulmonary embolus.  4.  High cholesterol.  5.  Coronary artery disease.   DISCHARGE DIAGNOSES:  1.  Arthrofibrosis following a left total knee replacement.  2.  Diabetes.  3.  History of a pulmonary embolus.  4.  High cholesterol.  5.  Coronary artery disease.  6.  Sleep apnea.   HISTORY OF PRESENT ILLNESS:  Patient is a 65 year old female who underwent a  left total knee replacement six months ago.  Her postoperative course was  complicated by an acute PE.  Rehab was slowed due to her blood clot,  therefore she was unable to regain full range of motion.  She now has  significant arthrofibrosis and is being admitted for debriding arthroscopy  and manipulation of her total knee replacement.  She is being admitted due  to her complicated medical history that includes heart disease, diabetes and  a history of a PE on postoperative day #1, from her total knee replacement.   PROCEDURES IN-HOUSE:  On April 26, 2005, patient underwent a left knee  arthroscopy and manipulation under anesthesia by Dr. Thurston Hole.  She was  admitted postoperatively for pain control, DVT prophylaxis and physical  therapy as well as medical monitoring.   Postoperative day #1, patient had difficulty with pain control and nausea.  Her hemoglobin was 12.1.  Her INR was 1.1.  Her range of motion was -20 to  90 degrees.  She had a moderate amount of pain.  She was placed in her CPM 0  to 90 degrees eight hours.  Home health was  consulted for PT, nursing and  CPM.   Postoperative day #2, patient improved with pain control.  She was T-max of  100.2.  She was metabolically stable.  Her hemoglobin was 12.1, INR 1.3.  She was discharged to home with Milk of Magnesia and a suppository to use  once she got home.   DISCHARGE MEDICATIONS:  1.  Norco 10 one to two q.4-6h. p.r.n. pain.  2.  Flexeril 10 mg one nightly p.r.n. muscle spasm.  3.  Vytorin 10/40 daily.  4.  Toprol XL 25 mg daily.  5.  Ambien 10 mg p.r.n. sleep.  6.  Actos 30 mg daily.  7.  Metformin 500 mg two times a day.  8.  Coumadin 7 mg daily.  9.  Lovenox 100 mg twice a day until her INR is 2 or greater.   She is instructed to use her CPM, to sleep in her knee immobilizer and to  work on her extension an hour twice a day.  She will follow up with Dr. Thurston Hole on May 03, 2005.      Kirstin Shepperson, P.A.      Robert A. Thurston Hole, M.D.  Electronically Signed    KS/MEDQ  D:  06/12/2005  T:  06/14/2005  Job:  [id number]

## 2010-08-21 NOTE — Discharge Summary (Signed)
NAMESAMMANTHA, MEHLHAFF                ACCOUNT NO.:  [id number]   MEDICAL RECORD NO.:  [id number]          PATIENT TYPE:  IPS   LOCATION:  4030                         FACILITY:  [facility]   PHYSICIAN:  Ranelle Oyster, M.D.DATE OF BIRTH:  01/13/1946   DATE OF ADMISSION:  06/16/2004  DATE OF DISCHARGE:  06/26/2004                                 DISCHARGE SUMMARY   DISCHARGE DIAGNOSES:  1.  Left total knee replacement.  2.  Postoperative pulmonary embolism.  3.  Tachycardia, improved.  4.  Postoperative anemia.  5.  Diabetes mellitus, type 2.  6.  Hypoxia.   HISTORY OF PRESENT ILLNESS:  Ms. Manner is a 65 year old female with a  history of morbid obesity and end-stage DJD of left knee, who elected to  undergo left total knee replacement on June 10, 2004, by Dr. Thurston Hole.  Postoperative is weightbearing as tolerated on Coumadin for DVT prophylaxis.  Post surgery, the patient was noted to have problems with fever and hypoxia.  Urine was also noted to be positive for bacteria and the patient was started  on antibiotics.  As she continued with tachycardia and hypoxia, a spiral CT  of the chest was done on June 13, 2004, showing acute right distal  pulmonary artery to upper lobe pulmonary branches embolism and probable mild  pulmonary edema.  Bilateral lower extremity Dopplers were difficult  secondary to body habitus, which revealed no DVT.  The patient was started  on IV heparin and continued on this until Coumadin therapeutic.  Currently  the patient continues to be hypoxic, requiring four units of O2 for  supplementation.  She is also noted to have some orthostasis and BP  medications were adjusted today.  Therapies were initiated and the patient  was noted to be at moderate assistance for bed mobility, moderate assistance  for transfers, minimal assistance for ambulating 12 feet with a rolling  walker.   PAST MEDICAL HISTORY:  Significant for:  1.  DM, type 2.  2.  Obesity.  3.   Hypertension.  4.  Hysterectomy.  5.  Insomnia.  6.  Stress-induced angina.   ALLERGIES:  No known drug allergies.   FAMILY HISTORY:  Positive for MI, cancer and DM.   SOCIAL HISTORY:  The patient is married.  She was working in a bank prior to  admission.  She was independent with a cane.  She does not use any tobacco  or alcohol.   HOSPITAL COURSE:  Ms. Genessis Flanary was admitted to rehabilitation on  June 16, 2004, for inpatient therapies to consist of PT and OT daily.  Post  admission, she was maintained on Coumadin throughout her stay.  Initially  the patient continued to require O2.  The patient's diabetes was monitored  on an a.c. and h.s. basis.  Blood sugars were initially noted to be  elevated.  The patient's Glucophage was resumed on June 22, 2004, with  blood sugars improving to the 120s-150s range overall.  Blood pressures have  been stable.  The patient's left knee incision has been healing along and is  clean  and dry without any signs or symptoms of infection.  Initially the  patient had problems with pain control issues.  She was started on Ultram  with improvement in pain symptomatology, especially at h.s.  During her stay  in rehabilitation, the patient progressed to being at modified independent  level for transfer, modified independent level for ambulating 100 feet with  a rolling walker and modified independent for ADL's  with use of assistive  equipment for low body care.  She is at modified independent level for  toileting.  Further followup therapies to include home health PT and OT with  [place] with R.N. arranged to follow pro time.   DISPOSITION:  On June 26, 2004, the patient was discharged to home.   DISCHARGE MEDICATIONS:  1.  Coumadin 2 mg p.o. per day at least for six months.  2.  Zocor 40 mg q.h.s.  3.  Avapro 150 mg a day.  4.  Glucophage 500 mg a day.  5.  Protonix 40 mg a day.  6.  MiraLax 17 g a day x fluid.  7.  Robaxin 500 mg q.i.d.  p.r.n.  8.  Actos 30 mg a day.  9.  Darvocet-N 100 one to two q.i.d. p.r.n. pain.  10. Ultram 50 mg p.o. q.i.d. p.r.n.   DIET:  Diabetic diet.   WOUND CARE:  Keep area clean and dry.  May wash with soap and water.   FOLLOWUP:  Next pro time to be checked on June 29, 2004.  The patient is to  follow up with Dr. Thurston Hole for postoperative check.  Follow up with Pam Drown, M.D., for routine check and for monitoring of Coumadin.      PP/MEDQ  D:  07/08/2004  T:  07/08/2004  Job:  [id number]   cc:   Molly Maduro A. Thurston Hole, M.D.  [address]  [place] [address]  Fax: [phone]   Sherin Quarry, MD   Pam Drown, M.D.  [address]  [place]  [place] [address]  Fax: [phone]

## 2010-08-21 NOTE — Op Note (Signed)
[name], [name]                ACCOUNT NO.:  [id number]   MEDICAL RECORD NO.:  [id number]          PATIENT TYPE:  INP   LOCATION:  5009                         FACILITY:  [facility]   PHYSICIAN:  Robert A. Thurston Hole, M.D. DATE OF BIRTH:  [birth year]   DATE OF PROCEDURE:  06/10/2004  DATE OF DISCHARGE:                                 OPERATIVE REPORT   PREOPERATIVE DIAGNOSIS:  Left knee degenerative joint disease.   POSTOPERATIVE DIAGNOSIS:  Left knee degenerative joint disease.   PROCEDURE:  Left total knee replacement using De[employer] Sigma total knee system  with #4 cemented femur, #4 cemented tibia, with 10-mm polyethylene RP tibial  spacer and 35-mm polyethylene cemented resurfacing patella.   SURGEON:  Salvatore Marvel, M.D.   ASSISTANT:  Julien Girt, P.A.   ANESTHESIA:  General.   OPERATIVE TIME:  One hour and 40 minutes.   COMPLICATIONS:  None.   DESCRIPTION OF PROCEDURE:  [name] was brought to operating room on June 10, 2004 after a femoral nerve block had been placed in the holding room by  anesthesia.  She was placed on operative table in supine position.  After  being placed under general anesthesia, her left knee was examined.  Range of  motion from -5 to 120 degrees with mild varus deformity,  knee stable to  ligamentous exam with normal patellar tracking.  She had a Foley catheter  placed under sterile conditions and  received Ancef 1 g IV preoperatively  for prophylaxis.  Her left leg was then prepped using sterile DuraPrep and  draped using sterile technique.  Originally, the leg was exsanguinated and a  thigh tourniquet elevated 375 mm.  Initially, through a 15-cm longitudinal  incision placed over the patella, initial exposure was made.  The underlying  subcutaneous tissues were incised along with the skin incision.  A median  arthrotomy was performed revealing an excessive amount of normal-appearing  joint fluid.  The articular surfaces were inspected.  She  had grade 4  changes medially, grade 3 and 4 changes laterally and grade 4 changes in the  patellofemoral joint.  The medial and lateral meniscal remnants were removed  as well as the anterior cruciate ligament.  Osteophytes were removed off the  femoral condyles and tibial plateau.  An intramedullary drill was then  drilled up the femoral canal for placement of the distal femoral cutting  jig, which was placed in the appropriate amount of rotation and a distal 10-  mm cut was made.  The distal femur was then incised.  A #4 was found to be  the appropriate size, a #4 cutting jig was placed and then these cuts were  made.  After this was done, the proximal tibia was exposed.  The tibial  spines were removed with an oscillating saw.  The intramedullary drill was  drilled down the tibial canal for placement of proximal tibial cutting jig,  which was placed in the appropriate manner rotation, and a proximal 6-mm cut  was made.  After this was done, then the PCL box cutter was placed back on  the distal femur and then these cuts were made.  At this point, the #4  femoral trial was placed.  The #4 tibial baseplate trial was placed and then  the keel cut was made.  After the #4 trial keel cut was made, then the 10-mm  polyethylene RP tibial spacer was placed.  The knee was taken through a  range of motion, 0-120 degrees,  with excellent stability, excellent  correction of her varus deformity.  The patella was then sized.  A 35-mm was  found to be the appropriate size after a 10-mm resurfacing cut was made with  the cutting jig and then 3 locking holes were placed up.  After this was  done, it was felt that all the trial components were of excellent size, fit  and stability.  They were then removed and the knee was then jet-lavage-  irrigated with 3 L of saline solution.  The proximal tibia was then exposed  and the #4 tibial prosthesis with cement backing was hammered into position  with an  excellent fit, with excess cement being removed from around the  edges.  The #4 femoral component with cement backing was hammered into  position, also with an excellent fit, with excess cement being removed from  around the edges.  The 10-mm RP tibial spacer was then placed on the tibial  baseplate and the knee taken through range of motion, 0-120 degrees, with  excellent stability.  The 35-mm polyethylene cement-backed patella was then  placed and locked into position as well, with excess cement being removed  from around the edges.  After the cement hardened, patellofemoral tracking  was evaluated.  There was slight lateral tracking noted and a lateral  release was carried out, improving tracking to normal.  At this point, it  was felt that all th components were of excellent size, fit and stability.  The knee was further irrigated with saline and then the arthrotomy was  closed with #1 Ethilon suture over 2 medium Hemovac drains.  Tourniquet was  released, small venous bleeders were cauterized and o excessive bleeding was  noted.  Subcutaneous tissues were then closed with 0 and 2-0 Vicryl and  subcuticular layer closed with 3-0 Monocryl. Steri-Strips were applied,  sterile dressings and a long leg splint applied, the Hemovac injected with  0.25% Marcaine with epinephrine and clamped, patient then awakened,  extubated and taken to recovery room in stable condition.  Needle and sponge  count was correct x2 at the end of the case.      RAW/MEDQ  D:  06/10/2004  T:  06/11/2004  Job:  [id number]

## 2010-08-21 NOTE — H&P (Signed)
NAMEWILLO, YOON                ACCOUNT NO.:  [id number]   MEDICAL RECORD NO.:  [id number]          PATIENT TYPE:  IPS   LOCATION:  4030                         FACILITY:  [facility]   PHYSICIAN:  Erick Colace, M.D.DATE OF BIRTH:  November 27, 1945   DATE OF ADMISSION:  06/16/2004  DATE OF DISCHARGE:                                HISTORY & PHYSICAL   ATTENDING PHYSICIAN:  Ranelle Oyster, M.D.   CHIEF COMPLAINT:  Decreased ability to walk.   HISTORY:  Ms. Domingos is a 65 year old female, morbidly obese, with a history  of end-stage degenerative joint disease, left knee.  She elected to undergo  left total knee replacement on June 10, 2004.  Postoperatively was  weightbearing as tolerated and was on Coumadin for DVT prophylaxis.  She had  postoperative fever and hypoxia.  Urine demonstrated bacteruria, and she was  started on antibiotics.  She developed some tachycardia, and a spiral CT of  the chest on June 13, 2004, demonstrated acute right distal pulmonary  artery, upper lobar pulmonary bronchus embolism, and probably mild pulmonary  edema.  She had bilateral lower extremity Dopplers read as negative, but  technically difficult secondary to her morbid obesity.  She was started on  IV heparin until Coumadin was therapeutic, and overlap time x 1 day, and her  INR was 3.2 today.  She also had continued hypoxia with activity and  decreased activity tolerance as well as orthostasis.  Her blood pressure  medications were resumed today.   REVIEW OF SYSTEMS:  Positive for shortness of breath.  No current chest  pain.  She has swelling in the legs, knee pain, weakness overall.   PAST MEDICAL HISTORY:  1.  Type 2 diabetes.  2.  Obesity.  3.  Hypertension.  4.  Hysterectomy.  5.  Insomnia.  6.  Stress-induced angina but normal Cardiolite.   FAMILY HISTORY:  Positive MI, positive cancer, positive DM.   SOCIAL HISTORY:  Married, working in a bank prior to admission.  Denies  alcohol  and tobacco.   FUNCTIONAL HISTORY:  Independent with a cane prior to admission.  Current  function status:  Moderate assistance bed mobility and transfers minimum  assistance, ambulation 12 feet with rolling walker.   MEDICATIONS:  1.  Avalide 150/12.5 p.o. daily.  2.  Zocor 40 mg p.o. daily.  3.  Actos 30 mg p.o. daily.  4.  Glucophage 500 mg p.o. daily.   ALLERGIES:  None known.   LABORATORY DATA AND OTHER STUDIES:  Hemoglobin 9.6, platelets 198, white  count 6.1 on March 13.  Sodium 131, potassium 3.2, BUN 21, creatinine 1.2,  and glucose 134 on March 13.   Chest x-ray March 10 showed some worsening of right perihilar and left  atelectasis.   PHYSICAL EXAMINATION:  VITAL SIGNS:  Blood pressure 114/68, pulse 98,  respirations 20, temperature 97.2.  GENERAL:  Morbidly obese female in no acute distress.  She is on 3 liters O2  per nasal cannula.  EYES:  Eyes not injected.  Pupil position and reactivity good.  ENT:  Exam was normal.  NECK:  Supple without adenopathy.  CHEST:  Respiratory effort normal with decreased breath sounds on the left  side.  HEART:  Tachycardic but regular rhythm.  She has good pedal pulses, and she  has mild edema, mainly on the left side.  NEUROLOGIC:  Her judgment, orientation, memory, mood, and affect are all  normal.  Sensation is normal.  Cranial nerves II-XII intact.  Motor exam  shows 5/5 bilateral upper extremity strength, 5/5 right hip flexor, 4- left  hip flexor, 3- left quadriceps, 4+ TA and gastrocnemius, right side 5/5.   IMPRESSION:  1.  Left total knee replacement secondary to end-stage degenerative joint      disease.  Postoperative day #6, weightbearing as tolerated.  2.  Postoperative pulmonary embolism, now on heparin as well as Coumadin.  3.  Hypoxia secondary to above on oxygen, to be adjusted as per O2      saturations.  4.  Diabetes, type 2, on Glucophage b.i.d.  Monitor creatinine as well as      blood sugars.  5.   Constipation, on Senokot.  Will try some Dulcolax tablets.  6.  Hypokalemia.  Add potassium supplement.  This also may help with      constipation.  7.  Postoperative anemia.  Add iron supplement.  8.  Pain.  OxyContin 20 q.12h.  9.  Continuous CPM.   Estimated length of stay is five to seven days.  The patient is a good rehab  candidate.  Prognosis for functional improvement is good, although it is  uncertain at this point whether she will be going home on oxygen or not.  She will follow up with Dr. Thurston Hole from orthopedics following discharge from  rehab.      AEK/MEDQ  D:  06/16/2004  T:  06/16/2004  Job:  [id number]   cc:   Molly Maduro A. Thurston Hole, M.D.  [address]  [place] [address]  Fax: [phone]   Sherin Quarry, MD   Ranelle Oyster, M.D.  [address] [place]  [place] [address]  Fax: [phone]

## 2010-12-24 ENCOUNTER — Other Ambulatory Visit: Payer: Self-pay | Admitting: Gastroenterology

## 2011-06-24 ENCOUNTER — Other Ambulatory Visit (HOSPITAL_COMMUNITY): Payer: Self-pay | Admitting: Family Medicine

## 2011-06-24 DIAGNOSIS — Z1231 Encounter for screening mammogram for malignant neoplasm of breast: Secondary | ICD-10-CM

## 2012-01-18 ENCOUNTER — Other Ambulatory Visit (HOSPITAL_COMMUNITY): Payer: Self-pay | Admitting: Orthopedic Surgery

## 2012-01-18 DIAGNOSIS — Z86711 Personal history of pulmonary embolism: Secondary | ICD-10-CM

## 2012-01-20 ENCOUNTER — Other Ambulatory Visit: Payer: Self-pay

## 2012-01-20 DIAGNOSIS — I2699 Other pulmonary embolism without acute cor pulmonale: Secondary | ICD-10-CM

## 2012-01-24 ENCOUNTER — Other Ambulatory Visit (HOSPITAL_COMMUNITY): Payer: Medicare Other

## 2012-01-24 ENCOUNTER — Encounter (HOSPITAL_COMMUNITY): Payer: Medicare Other

## 2012-01-24 ENCOUNTER — Other Ambulatory Visit (HOSPITAL_COMMUNITY): Payer: Self-pay

## 2012-01-25 ENCOUNTER — Encounter: Payer: Self-pay | Admitting: Critical Care Medicine

## 2012-01-25 ENCOUNTER — Telehealth: Payer: Self-pay | Admitting: *Deleted

## 2012-01-25 NOTE — Telephone Encounter (Signed)
ATC pt's home number - line busy x 5.  Called # listed as cell - received msg that number has been disconnected or is no longer in service.  WCB

## 2012-01-25 NOTE — Telephone Encounter (Signed)
Per pt's chart, she already has a pending appt scheduled with Dr. Delford Field on Jan 26, 2012 -- need to make sure pt is aware of this appt.

## 2012-01-25 NOTE — Telephone Encounter (Signed)
Called pt's home # - [email or web address] 

## 2012-01-25 NOTE — Telephone Encounter (Signed)
Message copied by Valentino Hue on Tue Jan 25, 2012  8:46 AM ------      Message from: Shan Levans E      Created: Fri Jan 21, 2012  1:02 PM       This pt needs a consult OV with me next week            preop pulmonary clearance for knee replacement      Referral is [facility].            pw

## 2012-01-25 NOTE — Telephone Encounter (Signed)
lmomtcb  

## 2012-01-25 NOTE — Telephone Encounter (Signed)
Spoke with pt.  Informed her of pending OV with Dr. Delford Field on tomorrow, Oct 23 at 9:30 am with PW.  She verbalized understanding.

## 2012-01-26 ENCOUNTER — Institutional Professional Consult (permissible substitution): Payer: Medicare Other | Admitting: Critical Care Medicine

## 2012-02-07 ENCOUNTER — Telehealth: Payer: Self-pay | Admitting: Critical Care Medicine

## 2012-02-07 NOTE — Telephone Encounter (Signed)
lmomtcb x1  don't see where anyone tried calling pt. Crystal did you try calling pt.

## 2012-02-07 NOTE — Telephone Encounter (Signed)
I spoke with pt and made her aware of this. She voiced her understanding and needed nothing further

## 2012-02-07 NOTE — Telephone Encounter (Signed)
I haven't tried to call pt since speaking with her in Oct.

## 2012-02-09 ENCOUNTER — Encounter: Payer: Self-pay | Admitting: Vascular Surgery

## 2012-02-10 ENCOUNTER — Other Ambulatory Visit: Payer: Self-pay | Admitting: Vascular Surgery

## 2012-02-10 ENCOUNTER — Encounter: Payer: Self-pay | Admitting: Vascular Surgery

## 2012-02-10 ENCOUNTER — Ambulatory Visit (INDEPENDENT_AMBULATORY_CARE_PROVIDER_SITE_OTHER): Payer: Medicare Other | Admitting: Vascular Surgery

## 2012-02-10 VITALS — BP 147/78 | HR 81 | Ht 67.0 in | Wt 285.0 lb

## 2012-02-10 DIAGNOSIS — I2699 Other pulmonary embolism without acute cor pulmonale: Secondary | ICD-10-CM | POA: Insufficient documentation

## 2012-02-10 NOTE — Progress Notes (Signed)
[facility] [place] HISTORY AND PHYSICAL   History of Present Illness:  Patient is a 66 y.o. year old female who presents for evaluation of venous thromboembolic risk perioperatively for a pending right total knee replacement. The patient had a left total knee replacement by Dr. Wyline Mood several years ago. This was complicated postoperatively by a pulmonary embolus. Subsequent to this she was placed on anticoagulation and developed a hemarthrosis. She eventually recovered from all of these events. She was on Coumadin for 18 months. This has now been discontinued. She denies family history of hypercoagulable state. She denies any prior other episodes of DVT or pulmonary embolus. She denies significant dyspnea at rest or with exertion. She states that she has had pneumonia a few times since her pulmonary embolus.  Other medical problems include degenerative arthritis, diabetes, hypertension, elevated cholesterol. These are currently stable.  Past Medical History  Diagnosis Date  . Right knee DJD   . Diabetes   . History of pulmonary embolism   . Hypertension   . High cholesterol   . Pulmonary embolism     Past Surgical History  Procedure Date  . Knee arthroplasty 2007    left  . Abdominal hysterectomy 1981  . Thyroidectomy 2011     Social History History  Substance Use Topics  . Smoking status: Never Smoker   . Smokeless tobacco: Never Used  . Alcohol Use: No    Family History Family History  Problem Relation Age of Onset  . Hypertension Mother   . Heart attack Mother   . Heart disease Mother     before age 57  . Other Mother     varicose veins  . Cancer Father   . Diabetes Father   . Heart disease Father   . Hyperlipidemia Father   . Hypertension Father   . Diabetes Son   . Hyperlipidemia Son   . Hypertension Son   . Heart attack Son   . Other Son     varicose veins    Allergies  No Known Allergies   Current Outpatient Prescriptions    Medication Sig Dispense Refill  . Celecoxib (CELEBREX PO) Take by mouth daily.      Marland Kitchen levothyroxine (SYNTHROID, LEVOTHROID) 200 MCG tablet Take 200 mcg by mouth daily.      Marland Kitchen lisinopril (PRINIVIL,ZESTRIL) 10 MG tablet Take 10 mg by mouth daily.      . metFORMIN (GLUCOPHAGE) 1000 MG tablet Take 1,000 mg by mouth daily.      . simvastatin (ZOCOR) 40 MG tablet Take 40 mg by mouth daily.      . Zolpidem Tartrate (AMBIEN PO) Take by mouth daily.        ROS:   General:  No weight loss, Fever, chills  HEENT: No recent headaches, no nasal bleeding, no visual changes, no sore throat  Neurologic: No dizziness, blackouts, seizures. No recent symptoms of stroke or mini- stroke. No recent episodes of slurred speech, or temporary blindness.  Cardiac: No recent episodes of chest pain/pressure, no shortness of breath at rest.  No shortness of breath with exertion.  Denies history of atrial fibrillation or irregular heartbeat  Vascular: No history of rest pain in feet.  No history of claudication.  No history of non-healing ulcer, No history of DVT   Pulmonary: No home oxygen, no productive cough, no hemoptysis,  No asthma or wheezing  Musculoskeletal:  [x ] Arthritis, [ ]  Low back pain,  [x ] Joint pain  Hematologic:No history of hypercoagulable state.  No history of easy bleeding.  No history of anemia  Gastrointestinal: No hematochezia or melena,  No gastroesophageal reflux, no trouble swallowing  Urinary: [ ]  chronic Kidney disease, [ ]  on HD - [ ]  MWF or [ ]  TTHS, [ ]  Burning with urination, [ ]  Frequent urination, [ ]  Difficulty urinating;   Skin: No rashes  Psychological: No history of anxiety,  No history of depression   Physical Examination  Filed Vitals:   02/10/12 1046  BP: 147/78  Pulse: 81  Height: 5\' 7"  (1.702 m)  Weight: 285 lb (129.275 kg)  SpO2: 100%    Body mass index is 44.64 kg/(m^2).  General:  Alert and oriented, no acute distress HEENT: Normal Neck: No bruit  or JVD Pulmonary: Clear to auscultation bilaterally Cardiac: Regular Rate and Rhythm without murmur Abdomen: Soft, non-tender, non-distended, no mass, obese Skin: No rash Extremity Pulses:  2+ radial, brachial, femoral, dorsalis pedis, posterior tibial pulses bilaterally Musculoskeletal: No deformity or edema  Neurologic: Upper and lower extremity motor 5/5 and symmetric  DATA: Recent venous duplex [facility] heart vascular was reviewed. This showed no evidence of DVT.   ASSESSMENT: This is a complicated patient in light of the fact that she previously had difficulty with anticoagulation with hemarthrosis as well as pulmonary embolus. She does not have family history of hypercoagulable state. She states that she has not had previous testing for hypercoagulable state. In theory her risk of DVT and pulmonary embolus should be similar to prior to her previous knee replacement. However, she certainly has shown that she is able to develop a pulmonary embolus after previous orthopedic procedures.  I had a lengthy discussion with the patient today about risks and benefits of periprocedural anticoagulation as well as the possibility of placing an inferior vena cava filter. Obviously there is risk and benefit to either of these.   PLAN:  I have sent her today for a hypercoagulable blood testing profile. Additionally she has followup scheduled with pulmonary, Dr. Delford Field on November 20. After reviewing the hypercoagulable profile as well as having a discussion with Dr. Delford Field we will decide whether or not a filter or perioperative anticoagulation would be the best option for her.  Fabienne Bruns, MD [facility] [place] Office: [phone] Pager: [phone]

## 2012-02-14 LAB — HYPERCOAGULABLE PANEL, COMPREHENSIVE RET.
AntiThromb III Func: 124 % (ref 76–126)
Beta-2 Glyco I IgG: 6 G Units (ref <20)
Beta-2-Glycoprotein I IgM: 113 M Units — ABNORMAL HIGH (ref <20)
Homocysteine: 19.4 umol/L — ABNORMAL HIGH (ref 4.0–15.4)
Protein C Activity: 200 % — ABNORMAL HIGH (ref 75–133)
Protein C, Total: 117 % (ref 72–160)
Protein S Activity: 88 % (ref 69–129)

## 2012-02-23 ENCOUNTER — Ambulatory Visit (INDEPENDENT_AMBULATORY_CARE_PROVIDER_SITE_OTHER): Payer: Medicare Other | Admitting: Critical Care Medicine

## 2012-02-23 ENCOUNTER — Encounter: Payer: Self-pay | Admitting: Critical Care Medicine

## 2012-02-23 VITALS — BP 130/82 | HR 85 | Temp 98.4°F | Ht 62.0 in | Wt 289.6 lb

## 2012-02-23 DIAGNOSIS — I8290 Acute embolism and thrombosis of unspecified vein: Secondary | ICD-10-CM

## 2012-02-23 DIAGNOSIS — I749 Embolism and thrombosis of unspecified artery: Secondary | ICD-10-CM

## 2012-02-23 DIAGNOSIS — I2699 Other pulmonary embolism without acute cor pulmonale: Secondary | ICD-10-CM

## 2012-02-23 NOTE — Patient Instructions (Signed)
No change in medications. I will call you after I confer with all of your other physicians

## 2012-02-23 NOTE — Progress Notes (Signed)
Subjective:    Patient ID: [name], female    DOB: 1945-09-21, 66 y.o.   MRN: [id number]  HPI   This is a 66 year old white female who had a previous history of pulmonary embolism and deep venous thrombosis in 2006. This occurred shortly after left total knee replacement surgery. The patient received anticoagulation for 18 months. She had a complicated course with the pulmonary embolism with right mainstem pulmonary artery occlusion. Subsequent CT scans of the chest revealed resolution of clot. She is now scheduled for yet another total knee replacement this time on the right knee. This is scheduled for January 2014. She is not on current anticoagulation. A recent hypercoagulable assay was normal except for mild elevation of homocystine. Deep venous Doppler lower extremities was recently obtained and is negative.  The patient has no respiratory complaints. There is no shortness of breath cough or dyspnea. There is no chest pain. There is no swelling in lower extremities. The patient is here for an opinion as to what should be done from an anticoagulation prospective pre-and postop with the upcoming knee replacement.   Past Medical History  Diagnosis Date  . Right knee DJD   . Diabetes   . History of pulmonary embolism   . Hypertension   . High cholesterol   . Pulmonary embolism   . Pneumonia   . Bell's palsy   . Sleep apnea      Family History  Problem Relation Age of Onset  . Hypertension Mother   . Heart attack Mother   . Heart disease Mother     before age 10  . Other Mother     varicose veins  . Cancer Father     kidney  . Diabetes Father   . Heart disease Father   . Hyperlipidemia Father   . Hypertension Father   . Diabetes Son   . Hyperlipidemia Son   . Hypertension Son   . Other Son     varicose veins     History   Social History  . Marital Status: Married    Spouse Name: N/A    Number of Children: 4  . Years of Education: N/A   Occupational History    . Retired     Psychologist, occupational  . [employer]    Social History Main Topics  . Smoking status: Never Smoker   . Smokeless tobacco: Never Used  . Alcohol Use: No  . Drug Use: No  . Sexually Active: Not on file   Other Topics Concern  . Not on file   Social History Narrative  . No narrative on file     No Known Allergies   Outpatient Prescriptions Prior to Visit  Medication Sig Dispense Refill  . Celecoxib (CELEBREX PO) Take 200 mg by mouth daily as needed.       Marland Kitchen levothyroxine (SYNTHROID, LEVOTHROID) 200 MCG tablet Take 200 mcg by mouth daily.      Marland Kitchen lisinopril (PRINIVIL,ZESTRIL) 10 MG tablet Take 10 mg by mouth daily.      . metFORMIN (GLUCOPHAGE) 1000 MG tablet Take by mouth. 500mg  in the morning and 1000mg  at night      . simvastatin (ZOCOR) 40 MG tablet Take 40 mg by mouth daily.      . Zolpidem Tartrate (AMBIEN PO) Take 10 mg by mouth at bedtime as needed.        Last reviewed on 02/23/2012  3:59 PM by Storm Frisk, MD     Review  of Systems  Constitutional: Positive for fatigue. Negative for fever, chills, diaphoresis, activity change, appetite change and unexpected weight change.  HENT: Positive for neck pain. Negative for hearing loss, ear pain, nosebleeds, congestion, sore throat, facial swelling, rhinorrhea, sneezing, mouth sores, trouble swallowing, neck stiffness, dental problem, voice change, postnasal drip, sinus pressure, tinnitus and ear discharge.   Eyes: Positive for itching. Negative for photophobia, discharge and visual disturbance.  Respiratory: Negative for apnea, cough, choking, chest tightness, shortness of breath, wheezing and stridor.   Cardiovascular: Positive for leg swelling. Negative for chest pain and palpitations.  Gastrointestinal: Positive for constipation. Negative for nausea, vomiting, abdominal pain, blood in stool and abdominal distention.  Genitourinary: Negative for dysuria, urgency, frequency, hematuria, flank pain, decreased urine volume and  difficulty urinating.  Musculoskeletal: Positive for joint swelling. Negative for myalgias, back pain, arthralgias and gait problem.  Skin: Negative for color change, pallor and rash.  Neurological: Positive for light-headedness and headaches. Negative for dizziness, tremors, seizures, syncope, speech difficulty, weakness and numbness.  Hematological: Negative for adenopathy. Bruises/bleeds easily.  Psychiatric/Behavioral: Negative for confusion, sleep disturbance and agitation. The patient is not nervous/anxious.        Objective:   Physical Exam Filed Vitals:   02/23/12 1545  BP: 130/82  Pulse: 85  Temp: 98.4 F (36.9 C)  TempSrc: Oral  Height: 5\' 2"  (1.575 m)  Weight: 289 lb 9.6 oz (131.362 kg)  SpO2: 96%    Gen: Pleasant, obese , in no distress,  normal affect  ENT: No lesions,  mouth clear,  oropharynx clear, no postnasal drip  Neck: No JVD, no TMG, no carotid bruits  Lungs: No use of accessory muscles, no dullness to percussion, clear without rales or rhonchi  Cardiovascular: RRR, heart sounds normal, no murmur or gallops, no peripheral edema  Abdomen: soft and NT, no HSM,  BS normal  Musculoskeletal: No deformities, no cyanosis or clubbing  Neuro: alert, non focal  Skin: Warm, no lesions or rashes  No results found. 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  No evidence now for active DVT or PE and needs another TKR. No evidence of hypercoaguability. I would suggest starting a drug like Xarelto 2 weeks prior to planned surgery at the 20mg  per day prevention dose.  Then stop 2-3 days prior to surgery and resume as soon as possible postop or use lovenox prophylactic dose postop and check venous doppler U/S soon after surgery on the LE  No indication for IVC Filter at this time.  Case discussed with Dr Kreg Shropshire who agrees with this  plan.   Updated Medication List Outpatient Encounter Prescriptions as of 02/23/2012  Medication Sig Dispense Refill  . Celecoxib (CELEBREX PO) Take 200 mg by mouth daily as needed.       Marland Kitchen levothyroxine (SYNTHROID, LEVOTHROID) 200 MCG tablet Take 200 mcg by mouth daily.      Marland Kitchen lisinopril (PRINIVIL,ZESTRIL) 10 MG tablet Take 10 mg by mouth daily.      . metFORMIN (GLUCOPHAGE) 1000 MG tablet Take by mouth. 500mg  in the morning and 1000mg  at night      . Misc. Devices (PRESCRIPTION BOTTLE MAGNIFIER) MISC by Does not apply route. Allergy injection once weekly at [facility] in [place]      . simvastatin (ZOCOR) 40 MG tablet Take 40 mg by mouth daily.      Marland Kitchen  Zolpidem Tartrate (AMBIEN PO) Take 10 mg by mouth at bedtime as needed.

## 2012-02-24 NOTE — Assessment & Plan Note (Signed)
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  No evidence now for active DVT or PE and needs another TKR. No evidence of hypercoaguability. I would suggest starting a drug like Xarelto 2 weeks prior to planned surgery at the 20mg  per day prevention dose.  Then stop 2-3 days prior to surgery and resume as soon as possible postop or use lovenox prophylactic dose postop and check venous doppler U/S soon after surgery on the LE  No indication for IVC Filter at this time.  Case discussed with Dr Kreg Shropshire who agrees with this plan.

## 2012-03-27 ENCOUNTER — Encounter (HOSPITAL_COMMUNITY): Payer: Self-pay | Admitting: Respiratory Therapy

## 2012-03-30 ENCOUNTER — Other Ambulatory Visit: Payer: Self-pay | Admitting: Physician Assistant

## 2012-03-30 DIAGNOSIS — G4730 Sleep apnea, unspecified: Secondary | ICD-10-CM | POA: Insufficient documentation

## 2012-03-30 DIAGNOSIS — I10 Essential (primary) hypertension: Secondary | ICD-10-CM | POA: Insufficient documentation

## 2012-03-30 DIAGNOSIS — J189 Pneumonia, unspecified organism: Secondary | ICD-10-CM

## 2012-03-30 DIAGNOSIS — E119 Type 2 diabetes mellitus without complications: Secondary | ICD-10-CM | POA: Insufficient documentation

## 2012-03-30 DIAGNOSIS — E7800 Pure hypercholesterolemia, unspecified: Secondary | ICD-10-CM | POA: Insufficient documentation

## 2012-03-30 DIAGNOSIS — M1711 Unilateral primary osteoarthritis, right knee: Secondary | ICD-10-CM

## 2012-03-30 DIAGNOSIS — G510 Bell's palsy: Secondary | ICD-10-CM | POA: Insufficient documentation

## 2012-03-30 DIAGNOSIS — I2699 Other pulmonary embolism without acute cor pulmonale: Secondary | ICD-10-CM

## 2012-03-30 DIAGNOSIS — Z86711 Personal history of pulmonary embolism: Secondary | ICD-10-CM

## 2012-03-30 NOTE — H&P (Signed)
TOTAL KNEE ADMISSION H&P  Patient is being admitted for right total knee arthroplasty.  Subjective:  Chief Complaint:right knee pain.  HPI: [name], 66 y.o. female, has a history of pain and functional disability in the right knee due to arthritis and has failed non-surgical conservative treatments for greater than 12 weeks to includeNSAID's and/or analgesics, corticosteriod injections, viscosupplementation injections, flexibility and strengthening excercises, supervised PT with diminished ADL's post treatment, use of assistive devices, weight reduction as appropriate and activity modification.  Onset of symptoms was gradual, starting 8 years ago with gradually worsening course since that time. The patient noted no past surgery on the right knee(s).  Patient currently rates pain in the right knee(s) at 8 out of 10 with activity. Patient has night pain, worsening of pain with activity and weight bearing, pain that interferes with activities of daily living, crepitus and joint swelling.  Patient has evidence of subchondral sclerosis, periarticular osteophytes and joint space narrowing by imaging studies. There is no active infection.  Patient Active Problem List   Diagnosis Date Noted  . Right knee DJD   . Diabetes   . History of pulmonary embolism   . Hypertension   . High cholesterol   . Pulmonary embolism   . Pneumonia   . Bell's palsy   . Sleep apnea   . Pulmonary embolus 02/10/2012   Past Medical History  Diagnosis Date  . Right knee DJD   . Diabetes   . History of pulmonary embolism   . Hypertension   . High cholesterol   . Pulmonary embolism   . Pneumonia   . Bell's palsy   . Sleep apnea     Past Surgical History  Procedure Date  . Knee arthroplasty 2007    left  . Abdominal hysterectomy 1981  . Thyroidectomy 2010     (Not in a hospital admission) No Known Allergies   Current Outpatient Prescriptions on File Prior to Visit  Medication Sig Dispense Refill  .  aspirin EC 81 MG tablet Take 81 mg by mouth daily.      . celecoxib (CELEBREX) 200 MG capsule Take 200 mg by mouth 2 (two) times daily as needed. For pain      . levothyroxine (SYNTHROID, LEVOTHROID) 200 MCG tablet Take 200 mcg by mouth daily.      Marland Kitchen lisinopril (PRINIVIL,ZESTRIL) 10 MG tablet Take 10 mg by mouth daily.      . metFORMIN (GLUCOPHAGE) 1000 MG tablet Take 500-1,000 mg by mouth 2 (two) times daily with a meal. 500mg  in the morning and 1000mg  at night      . simvastatin (ZOCOR) 40 MG tablet Take 40 mg by mouth daily.      Marland Kitchen zolpidem (AMBIEN) 10 MG tablet Take 10 mg by mouth at bedtime as needed. For sleep        History  Substance Use Topics  . Smoking status: Never Smoker   . Smokeless tobacco: Never Used  . Alcohol Use: No    Family History  Problem Relation Age of Onset  . Hypertension Mother   . Heart attack Mother   . Heart disease Mother     before age 65  . Other Mother     varicose veins  . Cancer Father     kidney  . Diabetes Father   . Heart disease Father   . Hyperlipidemia Father   . Hypertension Father   . Diabetes Son   . Hyperlipidemia Son   . Hypertension  Son   . Other Son     varicose veins     Review of Systems  Constitutional: Negative.   HENT: Negative.   Eyes: Negative.   Respiratory: Negative.   Cardiovascular: Negative.   Gastrointestinal: Negative.   Genitourinary: Negative.   Musculoskeletal: Positive for joint pain.       Right knee  Skin: Negative.   Neurological: Negative.   Endo/Heme/Allergies: Negative.   Psychiatric/Behavioral: Negative.     Objective:  Physical Exam  Constitutional: She is oriented to person, place, and time. She appears well-developed and well-nourished.  HENT:  Head: Normocephalic and atraumatic.  Eyes: Conjunctivae normal and EOM are normal. Pupils are equal, round, and reactive to light.  Neck: Normal range of motion. Neck supple.  Cardiovascular: Normal rate and regular rhythm.   Respiratory:  Effort normal.  GI: Soft.  Genitourinary:       Not pertinent to current symptomatology therefore not examined.  Musculoskeletal:       She is independently ambulatory with a moderately antalgic gait.  Right knee has active range of motion 0-90 degrees with valgus deformity.  3+ crepitus.  1+ joint effusion.  2+ synovitis.  2+ dorsalis pedis pulses.  Left knee has well healed total knee incision.  Active range of motion 0-110 degrees.  5/5 strength.  Normal patella tracking.  No deformity.  She has 2+ dorsalis pedis pulses.  Bilateral equal motor and sensory exam.  Negative Homans bilaterally.    Neurological: She is alert and oriented to person, place, and time.  Skin: Skin is dry.  Psychiatric: She has a normal mood and affect. Her behavior is normal. Judgment and thought content normal.    Vital signs in last 24 hours: Last recorded: 12/26 1500   BP: 137/78 Pulse: 83  Temp: 98.7 F (37.1 C)    Height: 5\' 6"  (1.676 m) SpO2: 98  Weight: 128.822 kg (284 lb)     Labs:   Estimated Body mass index is 45.84 kg/(m^2) as calculated from the following:   Height as of this encounter: 5\' 6" (1.676 m).   Weight as of this encounter: 284 lb(128.822 kg).   Imaging Review Plain radiographs demonstrate severe degenerative joint disease of the right knee(s). The overall alignment issignificant valgus. The bone quality appears to be good for age and reported activity level.  Assessment/Plan:  End stage arthritis, right knee   The patient history, physical examination, clinical judgment of the provider and imaging studies are consistent with end stage degenerative joint disease of the right knee(s) and total knee arthroplasty is deemed medically necessary. The treatment options including medical management, injection therapy arthroscopy and arthroplasty were discussed at length. The risks and benefits of total knee arthroplasty were presented and reviewed. The risks due to aseptic loosening,  infection, stiffness, patella tracking problems, thromboembolic complications and other imponderables were discussed. The patient acknowledged the explanation, agreed to proceed with the plan and consent was signed. Patient is being admitted for inpatient treatment for surgery, pain control, PT, OT, prophylactic antibiotics, VTE prophylaxis, progressive ambulation and ADL's and discharge planning. The patient is planning to be discharged to skilled nursing facility

## 2012-03-31 ENCOUNTER — Ambulatory Visit (HOSPITAL_COMMUNITY)
Admission: RE | Admit: 2012-03-31 | Discharge: 2012-03-31 | Disposition: A | Discharge status: Home / Self Care | Payer: Medicare Other | Source: Ambulatory Visit | Attending: Physician Assistant | Admitting: Physician Assistant

## 2012-03-31 ENCOUNTER — Encounter (HOSPITAL_COMMUNITY): Payer: Self-pay

## 2012-03-31 ENCOUNTER — Encounter (HOSPITAL_COMMUNITY)
Admission: RE | Admit: 2012-03-31 | Discharge: 2012-03-31 | Disposition: A | Discharge status: Home / Self Care | Payer: Medicare Other | Source: Ambulatory Visit | Attending: Orthopedic Surgery | Admitting: Orthopedic Surgery

## 2012-03-31 ENCOUNTER — Ambulatory Visit (HOSPITAL_COMMUNITY): Admission: RE | Admit: 2012-03-31 | Payer: Medicare Other | Source: Ambulatory Visit

## 2012-03-31 DIAGNOSIS — Z01811 Encounter for preprocedural respiratory examination: Secondary | ICD-10-CM | POA: Insufficient documentation

## 2012-03-31 DIAGNOSIS — IMO0002 Reserved for concepts with insufficient information to code with codable children: Secondary | ICD-10-CM | POA: Insufficient documentation

## 2012-03-31 DIAGNOSIS — M1710 Unilateral primary osteoarthritis, unspecified knee: Secondary | ICD-10-CM | POA: Insufficient documentation

## 2012-03-31 HISTORY — DX: Other specified postprocedural states: R11.2

## 2012-03-31 HISTORY — DX: Other specified postprocedural states: Z98.890

## 2012-03-31 LAB — COMPREHENSIVE METABOLIC PANEL
AST: 16 U/L (ref 0–37)
CO2: 28 mEq/L (ref 19–32)
Chloride: 103 mEq/L (ref 96–112)
Creatinine, Ser: 0.76 mg/dL (ref 0.50–1.10)
GFR calc non Af Amer: 87 mL/min — ABNORMAL LOW (ref >90)
Total Bilirubin: 0.5 mg/dL (ref 0.3–1.2)

## 2012-03-31 LAB — PROTIME-INR: INR: 1.62 — ABNORMAL HIGH (ref 0.00–1.49)

## 2012-03-31 LAB — URINALYSIS, ROUTINE W REFLEX MICROSCOPIC
Glucose, UA: NEGATIVE mg/dL
Ketones, ur: 15 mg/dL — AB
Leukocytes, UA: NEGATIVE
Specific Gravity, Urine: 1.031 — ABNORMAL HIGH (ref 1.005–1.030)
pH: 5.5 (ref 5.0–8.0)

## 2012-03-31 LAB — CBC WITH DIFFERENTIAL/PLATELET
Basophils Absolute: 0.1 10*3/uL (ref 0.0–0.1)
HCT: 40 % (ref 36.0–46.0)
Hemoglobin: 12.6 g/dL (ref 12.0–15.0)
Lymphocytes Relative: 37 % (ref 12–46)
Monocytes Absolute: 0.4 10*3/uL (ref 0.1–1.0)
Monocytes Relative: 7 % (ref 3–12)
Neutro Abs: 3.2 10*3/uL (ref 1.7–7.7)
RBC: 4.61 MIL/uL (ref 3.87–5.11)
RDW: 13.7 % (ref 11.5–15.5)
WBC: 5.9 10*3/uL (ref 4.0–10.5)

## 2012-03-31 LAB — APTT: aPTT: 43 seconds — ABNORMAL HIGH (ref 24–37)

## 2012-03-31 NOTE — Pre-Procedure Instructions (Signed)
20 [name]  03/31/2012   Your procedure is scheduled on:  Jan. 6,2014  (monday)  Report to [facility] at 8:30 AM.  Call this number if you have problems the morning of surgery: [phone]   Remember:   Do not eat food or liquids After Midnight.    Clear liquids include soda, tea, Scalese coffee, apple or grape juice, broth.  Take these medicines the morning of surgery with A SIP OF WATER: synthroid   Do not wear jewelry, make-up or nail polish.  Do not wear lotions, powders, or perfumes. You may wear deodorant.  Do not shave 48 hours prior to surgery. Men may shave face and neck.  Do not bring valuables to the hospital.  Contacts, dentures or bridgework may not be worn into surgery.  Leave suitcase in the car. After surgery it may be brought to your room.  For patients admitted to the hospital, checkout time is 11:00 AM the day of discharge.   Patients discharged the day of surgery will not be allowed to drive home.  Name and phone number of your driver:   Special Instructions: Shower using CHG 2 nights before surgery and the night before surgery.  If you shower the day of surgery use CHG.  Use special wash - you have one bottle of CHG for all showers.  You should use approximately 1/3 of the bottle for each shower.   Please read over the following fact sheets that you were given: Pain Booklet, Coughing and Deep Breathing, Blood Transfusion Information and MRSA Information

## 2012-03-31 NOTE — Progress Notes (Signed)
Requested ekg from [facility] at [facility] [employer].  Primary MD: Dr. Selena Batten at [place].  Pt. Reports she had several PE after her knee replacement in 2007 and Dr. Thurston Hole put her on xaretto 20 mg daily.Stated she will call him today to find out when she needs to stop the medication prior to surgery.

## 2012-04-01 LAB — URINE CULTURE: Colony Count: 45000

## 2012-04-03 NOTE — Progress Notes (Signed)
Recived EKG from Dr. Mickie Kay office. Placed in chart.

## 2012-04-03 NOTE — Progress Notes (Signed)
Faxed req to Dr. Toniann Fail McNeil's office for EKG.

## 2012-04-05 HISTORY — PX: BREAST LUMPECTOMY: SHX2

## 2012-04-07 NOTE — Consult Note (Signed)
Anesthesia chart review:  Patient is a 67 year old female scheduled for right TKR on 04/10/12 by Dr. Thurston Hole.  History includes non-smoker, morbid obesity, post-operative N/V, HTN, OSA, hypercholesterolemia, DM type 2, thyroidectomy with secondary hypothyroidism, pneumonia, Bell's Palsy, and history of post-operative right PE '06 following right TKR.  She subsequently developed hemarthrosis while on anticoagulation therapy.  Dr. Thurston Hole had patient see by both vascular surgeon Dr. Darrick Penna and pulmonologist Dr. Delford Field pre-operatively to inquire if patient needed a preoperative IVC filter or perioperative anticoagulation.  Hypercoagulable panel was ordered by Dr. Darrick Penna (see results from 02/10/12). Some results are still pending.  Protein C activity was elevated, but total Protein C was WNL.  Homocysteine, anticardiolipin IgM, Beta-2 Glycoprotein I IgA and IgM were also elevated.  Dr. Delford Field ultimately recommended that patient be placed on Xarelto 2 weeks prior to surgery at 20 mg daily then stop it 2-3 days prior to surgery with plans to resume as soon as possible post-operatively or use Lovenox prophylactic dose.  He also recommended checking a post-operative venous doppler ultrasound of the LE.  PCP is Dr. [name].  (PAT RN intended for me to review patient's chart, but was not left for me to review.  I did not review patient's chart until I received a phone call from [employer], PA-C regarding patient's Xarelto.)  EKG on 03/24/12 showed NSR.  Echo on 4/406 showed: - Overall left ventricular systolic function was normal. Left ventricular ejection fraction was estimated , range being 55 % to 65 %.. There were no left ventricular regional wall motion abnormalities. Left ventricular wall thickness was mildly increased. There was an increased relative contribution of atrial contraction to left ventricular filling. Doppler parameters were consistent with abnormal left ventricular relaxation.  She  had a normal stress test in 2001.  CXR on 03/31/12 showed chronic bronchitic changes. No acute abnormalities.  Preoperative labs noted.  PT/INR 18.7/1.62.  PTT 43.  Plan to repeat coags on arrival.  Discussed labs with Julien Girt, PA-C with Dr. Thurston Hole. She spoke with patient, and patient reported that she took her last dose of Xarelto before Monday's surgery today.  Shonna Chock, PA-C 04/07/12 1645

## 2012-04-09 MED ORDER — DEXTROSE 5 % IV SOLN
3.0000 g | INTRAVENOUS | Status: AC
  Administered 2012-04-10: 3 g via INTRAVENOUS
  Filled 2012-04-09: qty 3000

## 2012-04-10 ENCOUNTER — Encounter (HOSPITAL_COMMUNITY): Payer: Self-pay | Admitting: Physician Assistant

## 2012-04-10 ENCOUNTER — Inpatient Hospital Stay (HOSPITAL_COMMUNITY)
Admission: RE | Admit: 2012-04-10 | Discharge: 2012-04-12 | DRG: 470 | Disposition: A | Discharge status: Skilled Nursing Facility | Payer: Medicare Other | Source: Ambulatory Visit | Attending: Orthopedic Surgery | Admitting: Orthopedic Surgery

## 2012-04-10 ENCOUNTER — Inpatient Hospital Stay (HOSPITAL_COMMUNITY): Payer: Medicare Other | Admitting: Vascular Surgery

## 2012-04-10 ENCOUNTER — Encounter (HOSPITAL_COMMUNITY): Payer: Self-pay | Admitting: Vascular Surgery

## 2012-04-10 ENCOUNTER — Encounter (HOSPITAL_COMMUNITY): Payer: Self-pay

## 2012-04-10 ENCOUNTER — Encounter (HOSPITAL_COMMUNITY)
Admission: RE | Disposition: A | Discharge status: Skilled Nursing Facility | Payer: Self-pay | Source: Ambulatory Visit | Attending: Orthopedic Surgery

## 2012-04-10 DIAGNOSIS — M545 Low back pain, unspecified: Secondary | ICD-10-CM | POA: Diagnosis present

## 2012-04-10 DIAGNOSIS — F419 Anxiety disorder, unspecified: Secondary | ICD-10-CM | POA: Diagnosis not present

## 2012-04-10 DIAGNOSIS — Z7982 Long term (current) use of aspirin: Secondary | ICD-10-CM

## 2012-04-10 DIAGNOSIS — M1711 Unilateral primary osteoarthritis, right knee: Secondary | ICD-10-CM | POA: Diagnosis present

## 2012-04-10 DIAGNOSIS — G4730 Sleep apnea, unspecified: Secondary | ICD-10-CM | POA: Diagnosis present

## 2012-04-10 DIAGNOSIS — I10 Essential (primary) hypertension: Secondary | ICD-10-CM | POA: Diagnosis present

## 2012-04-10 DIAGNOSIS — E7800 Pure hypercholesterolemia, unspecified: Secondary | ICD-10-CM | POA: Diagnosis present

## 2012-04-10 DIAGNOSIS — Z8249 Family history of ischemic heart disease and other diseases of the circulatory system: Secondary | ICD-10-CM

## 2012-04-10 DIAGNOSIS — M1712 Unilateral primary osteoarthritis, left knee: Secondary | ICD-10-CM | POA: Diagnosis present

## 2012-04-10 DIAGNOSIS — E66813 Obesity, class 3: Secondary | ICD-10-CM | POA: Diagnosis present

## 2012-04-10 DIAGNOSIS — Z7901 Long term (current) use of anticoagulants: Secondary | ICD-10-CM

## 2012-04-10 DIAGNOSIS — E119 Type 2 diabetes mellitus without complications: Secondary | ICD-10-CM | POA: Diagnosis present

## 2012-04-10 DIAGNOSIS — T148XXA Other injury of unspecified body region, initial encounter: Secondary | ICD-10-CM

## 2012-04-10 DIAGNOSIS — G8929 Other chronic pain: Secondary | ICD-10-CM | POA: Diagnosis present

## 2012-04-10 DIAGNOSIS — Z833 Family history of diabetes mellitus: Secondary | ICD-10-CM

## 2012-04-10 DIAGNOSIS — Z86711 Personal history of pulmonary embolism: Secondary | ICD-10-CM

## 2012-04-10 DIAGNOSIS — Z96659 Presence of unspecified artificial knee joint: Secondary | ICD-10-CM

## 2012-04-10 DIAGNOSIS — Z6841 Body Mass Index (BMI) 40.0 and over, adult: Secondary | ICD-10-CM

## 2012-04-10 DIAGNOSIS — Z79899 Other long term (current) drug therapy: Secondary | ICD-10-CM

## 2012-04-10 DIAGNOSIS — I2699 Other pulmonary embolism without acute cor pulmonale: Secondary | ICD-10-CM | POA: Diagnosis present

## 2012-04-10 DIAGNOSIS — F411 Generalized anxiety disorder: Secondary | ICD-10-CM | POA: Diagnosis present

## 2012-04-10 DIAGNOSIS — M1710 Unilateral primary osteoarthritis, unspecified knee: Principal | ICD-10-CM | POA: Diagnosis present

## 2012-04-10 DIAGNOSIS — D62 Acute posthemorrhagic anemia: Secondary | ICD-10-CM | POA: Diagnosis not present

## 2012-04-10 DIAGNOSIS — M7981 Nontraumatic hematoma of soft tissue: Secondary | ICD-10-CM | POA: Diagnosis present

## 2012-04-10 HISTORY — DX: Unilateral primary osteoarthritis, left knee: M17.12

## 2012-04-10 HISTORY — DX: Other injury of unspecified body region, initial encounter: T14.8XXA

## 2012-04-10 HISTORY — PX: TOTAL KNEE ARTHROPLASTY: SHX125

## 2012-04-10 LAB — URINALYSIS, MICROSCOPIC ONLY
Bilirubin Urine: NEGATIVE
Hgb urine dipstick: NEGATIVE
Ketones, ur: NEGATIVE mg/dL
Specific Gravity, Urine: 1.025 (ref 1.005–1.030)
Urobilinogen, UA: 2 mg/dL — ABNORMAL HIGH (ref 0.0–1.0)

## 2012-04-10 SURGERY — ARTHROPLASTY, KNEE, TOTAL
Anesthesia: General | Site: Knee | Laterality: Right | Wound class: Clean

## 2012-04-10 MED ORDER — FENTANYL CITRATE 0.05 MG/ML IJ SOLN: 50.0000 ug | Freq: Once | INTRAMUSCULAR | Status: DC

## 2012-04-10 MED ORDER — BISACODYL 5 MG PO TBEC: 5.0000 mg | DELAYED_RELEASE_TABLET | Freq: Every day | ORAL | Status: DC | PRN

## 2012-04-10 MED ORDER — HYDROMORPHONE HCL PF 1 MG/ML IJ SOLN
INTRAMUSCULAR | Status: AC
  Filled 2012-04-10: qty 1

## 2012-04-10 MED ORDER — PHENOL 1.4 % MT LIQD: 1.0000 | OROMUCOSAL | Status: DC | PRN

## 2012-04-10 MED ORDER — OXYCODONE HCL 5 MG/5ML PO SOLN: 5.0000 mg | Freq: Once | ORAL | Status: DC | PRN

## 2012-04-10 MED ORDER — DIPHENHYDRAMINE HCL 12.5 MG/5ML PO ELIX: 12.5000 mg | ORAL_SOLUTION | ORAL | Status: DC | PRN

## 2012-04-10 MED ORDER — ZOLPIDEM TARTRATE 5 MG PO TABS: 5.0000 mg | ORAL_TABLET | Freq: Every evening | ORAL | Status: DC | PRN

## 2012-04-10 MED ORDER — ARTIFICIAL TEARS OP OINT
TOPICAL_OINTMENT | OPHTHALMIC | Status: DC | PRN
  Administered 2012-04-10: 1 via OPHTHALMIC

## 2012-04-10 MED ORDER — ROCURONIUM BROMIDE 100 MG/10ML IV SOLN
INTRAVENOUS | Status: DC | PRN
  Administered 2012-04-10: 50 mg via INTRAVENOUS

## 2012-04-10 MED ORDER — HYDROMORPHONE HCL PF 1 MG/ML IJ SOLN
INTRAMUSCULAR | Status: AC
  Administered 2012-04-10: 0.5 mg via INTRAVENOUS
  Filled 2012-04-10: qty 1

## 2012-04-10 MED ORDER — DIAZEPAM 5 MG PO TABS: 5.0000 mg | ORAL_TABLET | Freq: Four times a day (QID) | ORAL | Status: DC | PRN

## 2012-04-10 MED ORDER — MIDAZOLAM HCL 5 MG/5ML IJ SOLN
INTRAMUSCULAR | Status: DC | PRN
  Administered 2012-04-10: 1 mg via INTRAVENOUS

## 2012-04-10 MED ORDER — CEFUROXIME SODIUM 1.5 G IJ SOLR
INTRAMUSCULAR | Status: AC
  Filled 2012-04-10: qty 1.5

## 2012-04-10 MED ORDER — MUPIROCIN 2 % EX OINT
TOPICAL_OINTMENT | CUTANEOUS | Status: AC
  Administered 2012-04-10: 1 via NASAL
  Filled 2012-04-10: qty 22

## 2012-04-10 MED ORDER — DIAZEPAM 5 MG/ML IJ SOLN
INTRAMUSCULAR | Status: AC
  Administered 2012-04-10: 5 mg via INTRAVENOUS
  Filled 2012-04-10: qty 2

## 2012-04-10 MED ORDER — VANCOMYCIN HCL IN DEXTROSE 1-5 GM/200ML-% IV SOLN
1000.0000 mg | Freq: Two times a day (BID) | INTRAVENOUS | Status: AC
  Administered 2012-04-10: 1000 mg via INTRAVENOUS
  Filled 2012-04-10: qty 200

## 2012-04-10 MED ORDER — HYDROMORPHONE HCL PF 1 MG/ML IJ SOLN
0.5000 mg | INTRAMUSCULAR | Status: DC | PRN
  Administered 2012-04-10: 0.5 mg via INTRAVENOUS

## 2012-04-10 MED ORDER — PROPOFOL 10 MG/ML IV BOLUS
INTRAVENOUS | Status: DC | PRN
  Administered 2012-04-10: 200 mg via INTRAVENOUS

## 2012-04-10 MED ORDER — ACETAMINOPHEN 325 MG PO TABS: 650.0000 mg | ORAL_TABLET | Freq: Four times a day (QID) | ORAL | Status: DC | PRN

## 2012-04-10 MED ORDER — MIDAZOLAM HCL 2 MG/2ML IJ SOLN
1.0000 mg | INTRAMUSCULAR | Status: DC | PRN
  Administered 2012-04-10: 2 mg via INTRAVENOUS

## 2012-04-10 MED ORDER — MIDAZOLAM HCL 2 MG/2ML IJ SOLN
INTRAMUSCULAR | Status: AC
  Filled 2012-04-10: qty 2

## 2012-04-10 MED ORDER — FENTANYL CITRATE 0.05 MG/ML IJ SOLN
INTRAMUSCULAR | Status: AC
  Filled 2012-04-10: qty 2

## 2012-04-10 MED ORDER — BUPIVACAINE-EPINEPHRINE PF 0.5-1:200000 % IJ SOLN
INTRAMUSCULAR | Status: DC | PRN
  Administered 2012-04-10: 25 mL

## 2012-04-10 MED ORDER — SIMVASTATIN 40 MG PO TABS
40.0000 mg | ORAL_TABLET | Freq: Every day | ORAL | Status: DC
  Administered 2012-04-10 – 2012-04-11 (×2): 40 mg via ORAL
  Filled 2012-04-10 (×3): qty 1

## 2012-04-10 MED ORDER — MUPIROCIN 2 % EX OINT
TOPICAL_OINTMENT | Freq: Two times a day (BID) | CUTANEOUS | Status: DC
  Administered 2012-04-10: 22:00:00 via NASAL
  Administered 2012-04-10: 1 via NASAL
  Administered 2012-04-11 – 2012-04-12 (×3): via NASAL
  Filled 2012-04-10: qty 22

## 2012-04-10 MED ORDER — METOCLOPRAMIDE HCL 10 MG PO TABS: 5.0000 mg | ORAL_TABLET | Freq: Three times a day (TID) | ORAL | Status: DC | PRN

## 2012-04-10 MED ORDER — FENTANYL CITRATE 0.05 MG/ML IJ SOLN
INTRAMUSCULAR | Status: DC | PRN
  Administered 2012-04-10 (×2): 50 ug via INTRAVENOUS
  Administered 2012-04-10: 25 ug via INTRAVENOUS
  Administered 2012-04-10: 50 ug via INTRAVENOUS
  Administered 2012-04-10: 100 ug via INTRAVENOUS
  Administered 2012-04-10 (×3): 50 ug via INTRAVENOUS
  Administered 2012-04-10: 25 ug via INTRAVENOUS
  Administered 2012-04-10: 50 ug via INTRAVENOUS

## 2012-04-10 MED ORDER — ONDANSETRON HCL 4 MG PO TABS: 4.0000 mg | ORAL_TABLET | Freq: Four times a day (QID) | ORAL | Status: DC | PRN

## 2012-04-10 MED ORDER — MENTHOL 3 MG MT LOZG: 1.0000 | LOZENGE | OROMUCOSAL | Status: DC | PRN

## 2012-04-10 MED ORDER — OXYCODONE HCL 5 MG PO TABS
5.0000 mg | ORAL_TABLET | ORAL | Status: DC | PRN
  Administered 2012-04-11 – 2012-04-12 (×3): 10 mg via ORAL
  Filled 2012-04-10 (×4): qty 2

## 2012-04-10 MED ORDER — CHLORHEXIDINE GLUCONATE 4 % EX LIQD: 60.0000 mL | Freq: Once | CUTANEOUS | Status: DC

## 2012-04-10 MED ORDER — INSULIN ASPART 100 UNIT/ML ~~LOC~~ SOLN
0.0000 [IU] | Freq: Three times a day (TID) | SUBCUTANEOUS | Status: DC
  Administered 2012-04-11: 3 [IU] via SUBCUTANEOUS
  Administered 2012-04-11: 5 [IU] via SUBCUTANEOUS
  Administered 2012-04-12: 3 [IU] via SUBCUTANEOUS

## 2012-04-10 MED ORDER — CELECOXIB 200 MG PO CAPS
200.0000 mg | ORAL_CAPSULE | Freq: Two times a day (BID) | ORAL | Status: DC
  Administered 2012-04-10 – 2012-04-12 (×4): 200 mg via ORAL
  Filled 2012-04-10 (×5): qty 1

## 2012-04-10 MED ORDER — INSULIN ASPART 100 UNIT/ML ~~LOC~~ SOLN
0.0000 [IU] | Freq: Every day | SUBCUTANEOUS | Status: DC
  Administered 2012-04-11: 2 [IU] via SUBCUTANEOUS

## 2012-04-10 MED ORDER — LEVOTHYROXINE SODIUM 200 MCG PO TABS
200.0000 ug | ORAL_TABLET | Freq: Every day | ORAL | Status: DC
  Administered 2012-04-11 – 2012-04-12 (×2): 200 ug via ORAL
  Filled 2012-04-10 (×3): qty 1

## 2012-04-10 MED ORDER — DEXAMETHASONE SODIUM PHOSPHATE 10 MG/ML IJ SOLN
10.0000 mg | Freq: Every day | INTRAMUSCULAR | Status: DC
  Filled 2012-04-10 (×2): qty 1

## 2012-04-10 MED ORDER — ACETAMINOPHEN 10 MG/ML IV SOLN
1000.0000 mg | Freq: Four times a day (QID) | INTRAVENOUS | Status: AC
  Administered 2012-04-10 – 2012-04-11 (×3): 1000 mg via INTRAVENOUS
  Filled 2012-04-10 (×3): qty 100

## 2012-04-10 MED ORDER — CEFUROXIME SODIUM 1.5 G IJ SOLR
INTRAMUSCULAR | Status: DC | PRN
  Administered 2012-04-10: 1.5 g

## 2012-04-10 MED ORDER — ONDANSETRON HCL 4 MG/2ML IJ SOLN
INTRAMUSCULAR | Status: DC | PRN
  Administered 2012-04-10: 4 mg via INTRAVENOUS

## 2012-04-10 MED ORDER — BUPIVACAINE-EPINEPHRINE 0.25% -1:200000 IJ SOLN
INTRAMUSCULAR | Status: DC | PRN
  Administered 2012-04-10: 30 mL

## 2012-04-10 MED ORDER — NEOSTIGMINE METHYLSULFATE 1 MG/ML IJ SOLN
INTRAMUSCULAR | Status: DC | PRN
  Administered 2012-04-10: 3 mg via INTRAVENOUS

## 2012-04-10 MED ORDER — DEXAMETHASONE SODIUM PHOSPHATE 4 MG/ML IJ SOLN
INTRAMUSCULAR | Status: DC | PRN
  Administered 2012-04-10: 4 mg via INTRAVENOUS

## 2012-04-10 MED ORDER — PROMETHAZINE HCL 25 MG/ML IJ SOLN: 6.2500 mg | INTRAMUSCULAR | Status: DC | PRN

## 2012-04-10 MED ORDER — DEXAMETHASONE SODIUM PHOSPHATE 4 MG/ML IJ SOLN
INTRAMUSCULAR | Status: DC | PRN
  Administered 2012-04-10: 4 mg

## 2012-04-10 MED ORDER — OXYCODONE HCL 5 MG PO TABS: 5.0000 mg | ORAL_TABLET | Freq: Once | ORAL | Status: DC | PRN

## 2012-04-10 MED ORDER — LISINOPRIL 10 MG PO TABS
10.0000 mg | ORAL_TABLET | Freq: Every day | ORAL | Status: DC
  Administered 2012-04-10 – 2012-04-12 (×3): 10 mg via ORAL
  Filled 2012-04-10 (×3): qty 1

## 2012-04-10 MED ORDER — POVIDONE-IODINE 7.5 % EX SOLN: Freq: Once | CUTANEOUS | Status: DC

## 2012-04-10 MED ORDER — VANCOMYCIN HCL 10 G IV SOLR
1500.0000 mg | INTRAVENOUS | Status: DC
  Filled 2012-04-10: qty 1500

## 2012-04-10 MED ORDER — POTASSIUM CHLORIDE IN NACL 20-0.9 MEQ/L-% IV SOLN
INTRAVENOUS | Status: DC
  Administered 2012-04-10: 21:00:00 via INTRAVENOUS
  Filled 2012-04-10 (×3): qty 1000

## 2012-04-10 MED ORDER — ACETAMINOPHEN 650 MG RE SUPP: 650.0000 mg | Freq: Four times a day (QID) | RECTAL | Status: DC | PRN

## 2012-04-10 MED ORDER — METOCLOPRAMIDE HCL 5 MG/ML IJ SOLN: 5.0000 mg | Freq: Three times a day (TID) | INTRAMUSCULAR | Status: DC | PRN

## 2012-04-10 MED ORDER — BUPIVACAINE-EPINEPHRINE 0.25% -1:200000 IJ SOLN
INTRAMUSCULAR | Status: AC
  Filled 2012-04-10: qty 1

## 2012-04-10 MED ORDER — LACTATED RINGERS IV SOLN
INTRAVENOUS | Status: DC | PRN
  Administered 2012-04-10 (×2): via INTRAVENOUS

## 2012-04-10 MED ORDER — ONDANSETRON HCL 4 MG/2ML IJ SOLN
4.0000 mg | Freq: Four times a day (QID) | INTRAMUSCULAR | Status: DC | PRN
  Administered 2012-04-11: 4 mg via INTRAVENOUS
  Filled 2012-04-10: qty 2

## 2012-04-10 MED ORDER — LIDOCAINE HCL (CARDIAC) 20 MG/ML IV SOLN
INTRAVENOUS | Status: DC | PRN
  Administered 2012-04-10: 100 mg via INTRAVENOUS

## 2012-04-10 MED ORDER — SODIUM CHLORIDE 0.9 % IR SOLN
Status: DC | PRN
  Administered 2012-04-10: 3000 mL
  Administered 2012-04-10: 1000 mL

## 2012-04-10 MED ORDER — DEXAMETHASONE 6 MG PO TABS
10.0000 mg | ORAL_TABLET | Freq: Every day | ORAL | Status: DC
  Administered 2012-04-11 – 2012-04-12 (×2): 10 mg via ORAL
  Filled 2012-04-10 (×2): qty 1

## 2012-04-10 MED ORDER — LACTATED RINGERS IV SOLN: INTRAVENOUS | Status: DC

## 2012-04-10 MED ORDER — RIVAROXABAN 10 MG PO TABS
10.0000 mg | ORAL_TABLET | Freq: Every day | ORAL | Status: DC
  Administered 2012-04-11 – 2012-04-12 (×2): 10 mg via ORAL
  Filled 2012-04-10 (×4): qty 1

## 2012-04-10 MED ORDER — ACETAMINOPHEN 10 MG/ML IV SOLN
INTRAVENOUS | Status: AC
  Administered 2012-04-10: 1000 mg via INTRAVENOUS
  Filled 2012-04-10: qty 100

## 2012-04-10 MED ORDER — GLYCOPYRROLATE 0.2 MG/ML IJ SOLN
INTRAMUSCULAR | Status: DC | PRN
  Administered 2012-04-10: 0.4 mg via INTRAVENOUS

## 2012-04-10 MED ORDER — HYDROMORPHONE HCL PF 1 MG/ML IJ SOLN
0.5000 mg | INTRAMUSCULAR | Status: DC | PRN
  Administered 2012-04-10 – 2012-04-11 (×6): 1 mg via INTRAVENOUS
  Filled 2012-04-10 (×6): qty 1

## 2012-04-10 MED ORDER — DOCUSATE SODIUM 100 MG PO CAPS
100.0000 mg | ORAL_CAPSULE | Freq: Two times a day (BID) | ORAL | Status: DC
  Administered 2012-04-10 – 2012-04-12 (×4): 100 mg via ORAL
  Filled 2012-04-10 (×5): qty 1

## 2012-04-10 MED ORDER — DIAZEPAM 5 MG/ML IJ SOLN
5.0000 mg | Freq: Once | INTRAMUSCULAR | Status: AC
  Administered 2012-04-10: 5 mg via INTRAVENOUS

## 2012-04-10 MED ORDER — ALUM & MAG HYDROXIDE-SIMETH 200-200-20 MG/5ML PO SUSP
30.0000 mL | ORAL | Status: DC | PRN
  Administered 2012-04-12: 30 mL via ORAL
  Filled 2012-04-10: qty 30

## 2012-04-10 MED ORDER — ZOLPIDEM TARTRATE 5 MG PO TABS: 10.0000 mg | ORAL_TABLET | Freq: Every evening | ORAL | Status: DC | PRN

## 2012-04-10 MED ORDER — HYDROMORPHONE HCL PF 1 MG/ML IJ SOLN
0.2500 mg | INTRAMUSCULAR | Status: DC | PRN
  Administered 2012-04-10 (×4): 0.5 mg via INTRAVENOUS

## 2012-04-10 SURGICAL SUPPLY — 68 items
BANDAGE ESMARK 6X9 LF (GAUZE/BANDAGES/DRESSINGS) ×1 IMPLANT
BLADE SAGITTAL 25.0X1.19X90 (BLADE) ×2 IMPLANT
BLADE SAW SGTL 11.0X1.19X90.0M (BLADE) IMPLANT
BLADE SAW SGTL 13.0X1.19X90.0M (BLADE) ×2 IMPLANT
BLADE SURG 10 STRL SS (BLADE) ×4 IMPLANT
BNDG CMPR 9X6 STRL LF SNTH (GAUZE/BANDAGES/DRESSINGS) ×1
BNDG CMPR MED 15X6 ELC VLCR LF (GAUZE/BANDAGES/DRESSINGS) ×1
BNDG ELASTIC 6X15 VLCR STRL LF (GAUZE/BANDAGES/DRESSINGS) ×2 IMPLANT
BNDG ESMARK 6X9 LF (GAUZE/BANDAGES/DRESSINGS) ×2
BOWL SMART MIX CTS (DISPOSABLE) ×2 IMPLANT
CEMENT HV SMART SET (Cement) ×4 IMPLANT
CLOTH BEACON ORANGE TIMEOUT ST (SAFETY) ×2 IMPLANT
COVER BACK TABLE 24X17X13 BIG (DRAPES) IMPLANT
COVER PROBE W GEL 5X96 (DRAPES) ×2 IMPLANT
COVER SURGICAL LIGHT HANDLE (MISCELLANEOUS) ×2 IMPLANT
CUFF TOURNIQUET SINGLE 34IN LL (TOURNIQUET CUFF) ×1 IMPLANT
CUFF TOURNIQUET SINGLE 44IN (TOURNIQUET CUFF) ×1 IMPLANT
DRAPE EXTREMITY T 121X128X90 (DRAPE) ×2 IMPLANT
DRAPE INCISE IOBAN 66X45 STRL (DRAPES) ×2 IMPLANT
DRAPE PROXIMA HALF (DRAPES) ×2 IMPLANT
DRAPE U-SHAPE 47X51 STRL (DRAPES) ×2 IMPLANT
DRSG ADAPTIC 3X8 NADH LF (GAUZE/BANDAGES/DRESSINGS) ×2 IMPLANT
DRSG PAD ABDOMINAL 8X10 ST (GAUZE/BANDAGES/DRESSINGS) ×4 IMPLANT
DURAPREP 26ML APPLICATOR (WOUND CARE) ×2 IMPLANT
ELECT CAUTERY BLADE 6.4 (BLADE) ×2 IMPLANT
ELECT REM PT RETURN 9FT ADLT (ELECTROSURGICAL) ×2
ELECTRODE REM PT RTRN 9FT ADLT (ELECTROSURGICAL) ×1 IMPLANT
EVACUATOR 1/8 PVC DRAIN (DRAIN) ×1 IMPLANT
FACESHIELD LNG OPTICON STERILE (SAFETY) ×2 IMPLANT
GLOVE BIO SURGEON STRL SZ7 (GLOVE) ×2 IMPLANT
GLOVE BIOGEL PI IND STRL 7.0 (GLOVE) ×1 IMPLANT
GLOVE BIOGEL PI IND STRL 7.5 (GLOVE) ×1 IMPLANT
GLOVE BIOGEL PI INDICATOR 7.0 (GLOVE) ×1
GLOVE BIOGEL PI INDICATOR 7.5 (GLOVE) ×1
GLOVE SS BIOGEL STRL SZ 7.5 (GLOVE) ×1 IMPLANT
GLOVE SUPERSENSE BIOGEL SZ 7.5 (GLOVE) ×1
GOWN PREVENTION PLUS XLARGE (GOWN DISPOSABLE) ×4 IMPLANT
GOWN STRL NON-REIN LRG LVL3 (GOWN DISPOSABLE) ×4 IMPLANT
HANDPIECE INTERPULSE COAX TIP (DISPOSABLE) ×2
HOOD PEEL AWAY FACE SHEILD DIS (HOOD) ×4 IMPLANT
IMMOBILIZER KNEE 22 UNIV (SOFTGOODS) ×1 IMPLANT
INSERT CUSHION PRONEVIEW LG (MISCELLANEOUS) ×2 IMPLANT
KIT BASIN OR (CUSTOM PROCEDURE TRAY) ×2 IMPLANT
KIT ROOM TURNOVER OR (KITS) ×2 IMPLANT
MANIFOLD NEPTUNE II (INSTRUMENTS) ×2 IMPLANT
NS IRRIG 1000ML POUR BTL (IV SOLUTION) ×2 IMPLANT
PACK TOTAL JOINT (CUSTOM PROCEDURE TRAY) ×2 IMPLANT
PAD ARMBOARD 7.5X6 YLW CONV (MISCELLANEOUS) ×4 IMPLANT
PAD CAST 4YDX4 CTTN HI CHSV (CAST SUPPLIES) ×1 IMPLANT
PADDING CAST COTTON 4X4 STRL (CAST SUPPLIES) ×2
PADDING CAST COTTON 6X4 STRL (CAST SUPPLIES) ×2 IMPLANT
POSITIONER HEAD PRONE TRACH (MISCELLANEOUS) ×2 IMPLANT
RUBBERBAND STERILE (MISCELLANEOUS) ×2 IMPLANT
SET HNDPC FAN SPRY TIP SCT (DISPOSABLE) ×1 IMPLANT
SPONGE GAUZE 4X4 12PLY (GAUZE/BANDAGES/DRESSINGS) ×2 IMPLANT
STRIP CLOSURE SKIN 1/2X4 (GAUZE/BANDAGES/DRESSINGS) ×2 IMPLANT
SUCTION FRAZIER TIP 10 FR DISP (SUCTIONS) ×2 IMPLANT
SUT ETHIBOND NAB CT1 #1 30IN (SUTURE) ×4 IMPLANT
SUT MNCRL AB 3-0 PS2 18 (SUTURE) ×2 IMPLANT
SUT VIC AB 0 CT1 27 (SUTURE) ×4
SUT VIC AB 0 CT1 27XBRD ANBCTR (SUTURE) ×2 IMPLANT
SUT VIC AB 2-0 CT1 27 (SUTURE) ×4
SUT VIC AB 2-0 CT1 TAPERPNT 27 (SUTURE) ×2 IMPLANT
SYR 30ML SLIP (SYRINGE) ×2 IMPLANT
TOWEL OR 17X24 6PK STRL BLUE (TOWEL DISPOSABLE) ×2 IMPLANT
TOWEL OR 17X26 10 PK STRL BLUE (TOWEL DISPOSABLE) ×2 IMPLANT
TRAY FOLEY CATH 14FR (SET/KITS/TRAYS/PACK) ×2 IMPLANT
WATER STERILE IRR 1000ML POUR (IV SOLUTION) ×6 IMPLANT

## 2012-04-10 NOTE — Anesthesia Postprocedure Evaluation (Signed)
  Anesthesia Post-op Note  Patient: [name]  Procedure(s) Performed: Procedure(s) (LRB) with comments: TOTAL KNEE ARTHROPLASTY (Right)  Patient Location: PACU  Anesthesia Type:GA combined with regional for post-op pain  Level of Consciousness: awake and alert   Airway and Oxygen Therapy: Patient Spontanous Breathing  Post-op Pain: mild   Post-op Assessment: Post-op Vital signs reviewed, Patient's Cardiovascular Status Stable, Respiratory Function Stable, Patent Airway, No signs of Nausea or vomiting and Pain level controlled  Post-op Vital Signs: stable  Complications: No apparent anesthesia complications

## 2012-04-10 NOTE — H&P (View-Only) (Signed)
TOTAL KNEE ADMISSION H&P  Patient is being admitted for right total knee arthroplasty.  Subjective:  Chief Complaint:right knee pain.  HPI: [name], 67 y.o. female, has a history of pain and functional disability in the right knee due to arthritis and has failed non-surgical conservative treatments for greater than 12 weeks to includeNSAID's and/or analgesics, corticosteriod injections, viscosupplementation injections, flexibility and strengthening excercises, supervised PT with diminished ADL's post treatment, use of assistive devices, weight reduction as appropriate and activity modification.  Onset of symptoms was gradual, starting 8 years ago with gradually worsening course since that time. The patient noted no past surgery on the right knee(s).  Patient currently rates pain in the right knee(s) at 8 out of 10 with activity. Patient has night pain, worsening of pain with activity and weight bearing, pain that interferes with activities of daily living, crepitus and joint swelling.  Patient has evidence of subchondral sclerosis, periarticular osteophytes and joint space narrowing by imaging studies. There is no active infection.  Patient Active Problem List   Diagnosis Date Noted  . Right knee DJD   . Diabetes   . History of pulmonary embolism   . Hypertension   . High cholesterol   . Pulmonary embolism   . Pneumonia   . Bell's palsy   . Sleep apnea   . Pulmonary embolus 02/10/2012   Past Medical History  Diagnosis Date  . Right knee DJD   . Diabetes   . History of pulmonary embolism   . Hypertension   . High cholesterol   . Pulmonary embolism   . Pneumonia   . Bell's palsy   . Sleep apnea     Past Surgical History  Procedure Date  . Knee arthroplasty 2007    left  . Abdominal hysterectomy 1981  . Thyroidectomy 2010     (Not in a hospital admission) No Known Allergies   Current Outpatient Prescriptions on File Prior to Visit  Medication Sig Dispense Refill  .  aspirin EC 81 MG tablet Take 81 mg by mouth daily.      . celecoxib (CELEBREX) 200 MG capsule Take 200 mg by mouth 2 (two) times daily as needed. For pain      . levothyroxine (SYNTHROID, LEVOTHROID) 200 MCG tablet Take 200 mcg by mouth daily.      . lisinopril (PRINIVIL,ZESTRIL) 10 MG tablet Take 10 mg by mouth daily.      . metFORMIN (GLUCOPHAGE) 1000 MG tablet Take 500-1,000 mg by mouth 2 (two) times daily with a meal. 500mg in the morning and 1000mg at night      . simvastatin (ZOCOR) 40 MG tablet Take 40 mg by mouth daily.      . zolpidem (AMBIEN) 10 MG tablet Take 10 mg by mouth at bedtime as needed. For sleep        History  Substance Use Topics  . Smoking status: Never Smoker   . Smokeless tobacco: Never Used  . Alcohol Use: No    Family History  Problem Relation Age of Onset  . Hypertension Mother   . Heart attack Mother   . Heart disease Mother     before age 60  . Other Mother     varicose veins  . Cancer Father     kidney  . Diabetes Father   . Heart disease Father   . Hyperlipidemia Father   . Hypertension Father   . Diabetes Son   . Hyperlipidemia Son   . Hypertension   Son   . Other Son     varicose veins     Review of Systems  Constitutional: Negative.   HENT: Negative.   Eyes: Negative.   Respiratory: Negative.   Cardiovascular: Negative.   Gastrointestinal: Negative.   Genitourinary: Negative.   Musculoskeletal: Positive for joint pain.       Right knee  Skin: Negative.   Neurological: Negative.   Endo/Heme/Allergies: Negative.   Psychiatric/Behavioral: Negative.     Objective:  Physical Exam  Constitutional: She is oriented to person, place, and time. She appears well-developed and well-nourished.  HENT:  Head: Normocephalic and atraumatic.  Eyes: Conjunctivae normal and EOM are normal. Pupils are equal, round, and reactive to light.  Neck: Normal range of motion. Neck supple.  Cardiovascular: Normal rate and regular rhythm.   Respiratory:  Effort normal.  GI: Soft.  Genitourinary:       Not pertinent to current symptomatology therefore not examined.  Musculoskeletal:       She is independently ambulatory with a moderately antalgic gait.  Right knee has active range of motion 0-90 degrees with valgus deformity.  3+ crepitus.  1+ joint effusion.  2+ synovitis.  2+ dorsalis pedis pulses.  Left knee has well healed total knee incision.  Active range of motion 0-110 degrees.  5/5 strength.  Normal patella tracking.  No deformity.  She has 2+ dorsalis pedis pulses.  Bilateral equal motor and sensory exam.  Negative Homans bilaterally.    Neurological: She is alert and oriented to person, place, and time.  Skin: Skin is dry.  Psychiatric: She has a normal mood and affect. Her behavior is normal. Judgment and thought content normal.    Vital signs in last 24 hours: Last recorded: 12/26 1500   BP: 137/78 Pulse: 83  Temp: 98.7 F (37.1 C)    Height: 5' 6" (1.676 m) SpO2: 98  Weight: 128.822 kg (284 lb)     Labs:   Estimated Body mass index is 45.84 kg/(m^2) as calculated from the following:   Height as of this encounter: 5' 6"(1.676 m).   Weight as of this encounter: 284 lb(128.822 kg).   Imaging Review Plain radiographs demonstrate severe degenerative joint disease of the right knee(s). The overall alignment issignificant valgus. The bone quality appears to be good for age and reported activity level.  Assessment/Plan:  End stage arthritis, right knee   The patient history, physical examination, clinical judgment of the provider and imaging studies are consistent with end stage degenerative joint disease of the right knee(s) and total knee arthroplasty is deemed medically necessary. The treatment options including medical management, injection therapy arthroscopy and arthroplasty were discussed at length. The risks and benefits of total knee arthroplasty were presented and reviewed. The risks due to aseptic loosening,  infection, stiffness, patella tracking problems, thromboembolic complications and other imponderables were discussed. The patient acknowledged the explanation, agreed to proceed with the plan and consent was signed. Patient is being admitted for inpatient treatment for surgery, pain control, PT, OT, prophylactic antibiotics, VTE prophylaxis, progressive ambulation and ADL's and discharge planning. The patient is planning to be discharged to skilled nursing facility   

## 2012-04-10 NOTE — Op Note (Signed)
MRN:     [id number] DOB/AGE:    [birth year] / 67 y.o.       OPERATIVE REPORT    DATE OF PROCEDURE:  04/10/2012       PREOPERATIVE DIAGNOSIS:   DJD RT KNEE      Estimated Body mass index is 52.97 kg/(m^2) as calculated from the following:   Height as of 02/23/12: 5\' 2" (1.575 m).   Weight as of 02/23/12: 289 lb 9.6 oz(131.362 kg).                                                        POSTOPERATIVE DIAGNOSIS:   degenerative joint disease right knee                                                                      PROCEDURE:  Procedure(s): TOTAL KNEE ARTHROPLASTY Using Depuy Sigma RP implants #4 Femur, #4Tibia, 10mm sigma RP bearing, 35 Patella     SURGEON: Ociel Retherford A    ASSISTANT:  Kirstin Shepperson PA-C   (Present and scrubbed throughout the case, critical for assistance with exposure, retraction, instrumentation, and closure.)         ANESTHESIA: GET with Femoral Nerve Block  DRAINS: foley, 2 medium hemovac in knee   TOURNIQUET TIME:   COMPLICATIONS:  None     SPECIMENS: None   INDICATIONS FOR PROCEDURE: The patient has  DJD RT KNEE, varus deformities, XR shows bone on bone arthritis. Patient has failed all conservative measures including anti-inflammatory medicines, narcotics, attempts at  exercise and weight loss, cortisone injections and viscosupplementation.  Risks and benefits of surgery have been discussed, questions answered.   DESCRIPTION OF PROCEDURE: The patient identified by armband, received  right femoral nerve block and IV antibiotics, in the holding area at [facility] Patient taken to the operating room, appropriate anesthetic  monitors were attached General endotracheal anesthesia induced with  the patient in supine position, Foley catheter was inserted. Tourniquet  applied high to the operative thigh. Lateral post and foot positioner  applied to the table, the lower extremity was then prepped and draped  in usual sterile fashion from the  ankle to the tourniquet. Time-out procedure was performed. The limb was wrapped with an Esmarch bandage and the tourniquet inflated to 365 mmHg. We began the operation by making the anterior midline incision starting at handbreadth above the patella going over the patella 1 cm medial to and  4 cm distal to the tibial tubercle. Small bleeders in the skin and the  subcutaneous tissue identified and cauterized. Transverse retinaculum was incised and reflected medially and a medial parapatellar arthrotomy was accomplished. the patella was everted and theprepatellar fat pad resected. The superficial medial collateral  ligament was then elevated from anterior to posterior along the proximal  flare of the tibia and anterior half of the menisci resected. The knee was hyperflexed exposing bone on bone arthritis. Peripheral and notch osteophytes as well as the cruciate ligaments were then resected. We continued to  work our way around posteriorly along the proximal tibia, and externally  rotated the tibia subluxing it out from underneath the femur. A McHale  retractor was placed through the notch and a lateral Hohmann retractor  placed, and we then drilled through the proximal tibia in line with the  axis of the tibia followed by an intramedullary guide rod and 2-degree  posterior slope cutting guide. The tibial cutting guide was pinned into place  allowing resection of 4 mm of bone medially and about 6 mm of bone  laterally because of her varus deformity. Satisfied with the tibial resection, we then  entered the distal femur 2 mm anterior to the PCL origin with the  intramedullary guide rod and applied the distal femoral cutting guide  set at 13mm, with 5 degrees of valgus. This was pinned along the  epicondylar axis. At this point, the distal femoral cut was accomplished without difficulty. We then sized for a #4 femoral component and pinned the guide in 3 degrees of external rotation.The chamfer cutting  guide was pinned into place. The anterior, posterior, and chamfer cuts were accomplished without difficulty followed by  the Sigma RP box cutting guide and the box cut. We also removed posterior osteophytes from the posterior femoral condyles. At this  time, the knee was brought into full extension. We checked our  extension and flexion gaps and found them symmetric at 10mm.  The patella thickness measured at 25 mm. We set the cutting guide at 15 and removed the posterior 9.5-10 mm  of the patella sized for 35 button and drilled the lollipop. The knee  was then once again hyperflexed exposing the proximal tibia. We sized for a #4 tibial base plate, applied the smokestack and the conical reamer followed by the the Delta fin keel punch. We then hammered into place the Sigma RP trial femoral component, inserted a 10-mm trial bearing, trial patellar button, and took the knee through range of motion from 0-130 degrees. No thumb pressure was required for patellar  tracking. At this point, all trial components were removed, a double batch of DePuy HV cement with 1500 mg of Zinacef was mixed and applied to all bony metallic mating surfaces except for the posterior condyles of the femur itself. In order, we  hammered into place the tibial tray and removed excess cement, the femoral component and removed excess cement, a 10-mm Sigma RP bearing  was inserted, and the knee brought to full extension with compression.  The patellar button was clamped into place, and excess cement  removed. While the cement cured the wound was irrigated out with normal saline solution pulse lavage, and medium Hemovac drains were placed.. Ligament stability and patellar tracking were checked and found to be excellent. The tourniquet was then released and hemostasis was obtained with cautery. The parapatellar arthrotomy was closed with  #1 ethibond suture. The subcutaneous tissue with 0 and 2-0 undyed  Vicryl suture, and 4-0 Monocryl.. A  dressing of Xeroform,  4 x 4, dressing sponges, Webril, and Ace wrap applied. Needle and sponge count were correct times 2.The patient awakened, extubated, and taken to recovery room without difficulty. Vascular status was normal, pulses 2+ and symmetric.   Christyann Manolis A 04/10/2012, 1:07 PM

## 2012-04-10 NOTE — Preoperative (Signed)
Beta Blockers   Reason not to administer Beta Blockers:Not Applicable 

## 2012-04-10 NOTE — Anesthesia Preprocedure Evaluation (Addendum)
Anesthesia Evaluation  Patient identified by MRN, date of birth, ID band Patient awake    Reviewed: Allergy & Precautions, H&P , NPO status , Patient's Chart, lab work & pertinent test results  History of Anesthesia Complications (+) PONV  Airway Mallampati: II TM Distance: >3 FB Neck ROM: Full    Dental   Pulmonary sleep apnea and Continuous Positive Airway Pressure Ventilation ,  breath sounds clear to auscultation        Cardiovascular hypertension, Pt. on medications Rhythm:Regular Rate:Normal     Neuro/Psych    GI/Hepatic   Endo/Other  diabetes, Type 2, Oral Hypoglycemic AgentsMorbid obesity  Renal/GU      Musculoskeletal   Abdominal (+) + obese,   Peds  Hematology   Anesthesia Other Findings   Reproductive/Obstetrics                          Anesthesia Physical Anesthesia Plan  ASA: III  Anesthesia Plan: General   Post-op Pain Management:    Induction: Intravenous  Airway Management Planned: Oral ETT  Additional Equipment:   Intra-op Plan:   Post-operative Plan: Extubation in OR  Informed Consent: I have reviewed the patients History and Physical, chart, labs and discussed the procedure including the risks, benefits and alternatives for the proposed anesthesia with the patient or authorized representative who has indicated his/her understanding and acceptance.     Plan Discussed with: CRNA and Surgeon  Anesthesia Plan Comments:         Anesthesia Quick Evaluation

## 2012-04-10 NOTE — Evaluation (Signed)
Physical Therapy Evaluation Patient Details Name: [name] MRN: [id number] DOB: 12/01/1945 Today's Date: 04/10/2012 Time: 4098-1191 PT Time Calculation (min): 36 min  PT Assessment / Plan / Recommendation Clinical Impression  Patient is a 67 yo female s/p Rt. TKA.  Patient requiring mod assist for mobility.  Will benefit from acute PT to increase activity tolerance and maximize independence prior to discharge to next level of care.    PT Assessment  Patient needs continued PT services    Follow Up Recommendations  SNF    Does the patient have the potential to tolerate intense rehabilitation      Barriers to Discharge        Equipment Recommendations  Rolling walker with 5" wheels    Recommendations for Other Services     Frequency 7X/week    Precautions / Restrictions Precautions Precautions: Knee Precaution Booklet Issued: Yes (comment) Precaution Comments: Reviewed precautions Required Braces or Orthoses: Knee Immobilizer - Right Knee Immobilizer - Right: On except when in CPM;Discontinue once straight leg raise with < 10 degree lag Restrictions Weight Bearing Restrictions: Yes RLE Weight Bearing: Weight bearing as tolerated   Pertinent Vitals/Pain       Mobility  Bed Mobility Bed Mobility: Supine to Sit;Sitting - Scoot to [place] of Bed;Sit to Supine;Scooting to [facility] Supine to Sit: 3: Mod assist;HOB elevated Sitting - Scoot to Edge of Bed: 4: Min assist Sit to Supine: 3: Mod assist Scooting to [facility]: 3: Mod assist;With trapeze;Other (comment) (Bed trendelenberg) Details for Bed Mobility Assistance: Verbal cues for technique.  Assist to move RLE off of and onto bed.  Patient sat at EOB x 8 minutes with good balance.  Eyes closed due to pain and nausea. Reapplied CPM at end of session. Transfers Transfers: Not assessed       Exercises Total Joint Exercises Ankle Circles/Pumps: AROM;Both;10 reps;Seated   PT Diagnosis: Difficulty walking;Generalized  weakness;Acute pain  PT Problem List: Decreased strength;Decreased range of motion;Decreased activity tolerance;Decreased balance;Decreased mobility;Decreased knowledge of use of DME;Decreased knowledge of precautions;Obesity;Pain PT Treatment Interventions: DME instruction;Gait training;Functional mobility training;Therapeutic exercise;Patient/family education   PT Goals Acute Rehab PT Goals PT Goal Formulation: With patient Time For Goal Achievement: 04/24/12 Potential to Achieve Goals: Good Pt will go Supine/Side to Sit: Independently;with HOB 0 degrees PT Goal: Supine/Side to Sit - Progress: Goal set today Pt will go Sit to Supine/Side: Independently;with HOB 0 degrees PT Goal: Sit to Supine/Side - Progress: Goal set today Pt will go Sit to Stand: with supervision;with upper extremity assist PT Goal: Sit to Stand - Progress: Goal set today Pt will Ambulate: 51 - 150 feet;with supervision;with rolling walker PT Goal: Ambulate - Progress: Goal set today Pt will Perform Home Exercise Program: with supervision, verbal cues required/provided PT Goal: Perform Home Exercise Program - Progress: Goal set today  Visit Information  Last PT Received On: 04/10/12 Assistance Needed: +2    Subjective Data  Subjective: "I'm feeling a little nauseated" Patient Stated Goal: To walk.  To eventually return home.   Prior Functioning  Home Living Lives With: Spouse Available Help at Discharge: Skilled Nursing Facility Home Adaptive Equipment: Straight cane;Shower chair with back Prior Function Level of Independence: Independent Able to Take Stairs?: Yes Driving: Yes Vocation: Full time employment Comments: Retired from Education officer, environmental.  Now full time potter. Communication Communication: No difficulties    Cognition  Overall Cognitive Status: Appears within functional limits for tasks assessed/performed Arousal/Alertness: Lethargic Orientation Level: Appears intact for tasks assessed Behavior  During Session:  Lethargic    Extremity/Trunk Assessment Right Upper Extremity Assessment RUE ROM/Strength/Tone: WFL for tasks assessed RUE Sensation: WFL - Light Touch Left Upper Extremity Assessment LUE ROM/Strength/Tone: WFL for tasks assessed LUE Sensation: WFL - Light Touch Right Lower Extremity Assessment RLE ROM/Strength/Tone: Deficits;Unable to fully assess;Due to pain RLE ROM/Strength/Tone Deficits: Strength 3-/5; Decreased ROM Left Lower Extremity Assessment LLE ROM/Strength/Tone: WFL for tasks assessed LLE Sensation: WFL - Light Touch   Balance    End of Session PT - End of Session Equipment Utilized During Treatment: Right knee immobilizer;Oxygen Activity Tolerance: Patient limited by pain;Patient limited by fatigue Patient left: in bed;in CPM;with call bell/phone within reach Nurse Communication: Mobility status (CPM reapplied) CPM Right Knee CPM Right Knee: On Right Knee Flexion (Degrees): 60  Right Knee Extension (Degrees): 0  Additional Comments: TRAPEZE BAR  GP     Vena Austria 04/10/2012, 7:21 PM Durenda Hurt. Renaldo Fiddler, [facility] Acute Rehab Services Pager [phone]

## 2012-04-10 NOTE — Anesthesia Procedure Notes (Addendum)
Anesthesia Regional Block:  Femoral nerve block  Pre-Anesthetic Checklist: ,, timeout performed, Correct Patient, Correct Site, Correct Laterality, Correct Procedure, Correct Position, site marked, Risks and benefits discussed,  Surgical consent,  Pre-op evaluation,  At surgeon's request and post-op pain management  Laterality: Right  Prep: chloraprep       Needles:  Injection technique: Single-shot  Needle Type: Echogenic Stimulator Needle      Needle Gauge: 22 and 22 G    Additional Needles:  Procedures: nerve stimulator Femoral nerve block  Nerve Stimulator or Paresthesia:  Response: 0.5 mA,   Additional Responses:   Narrative:  Start time: 04/10/2012 10:22 AM End time: 04/10/2012 10:40 AM Injection made incrementally with aspirations every 5 mL. Anesthesiologist: Dr Gypsy Balsam  Additional Notes: 1610-9604 R FNB POP CHG prep, sterile tech #22 stim needle w/stim down to .48ma Multiple neg asp Marc .5% w/epi 1:200000 total 25cc+decadron 4mg  infiltrated No compl Dr Gypsy Balsam  Femoral nerve block Procedure Name: Intubation Date/Time: 04/10/2012 11:18 AM Performed by: Romie Minus K Pre-anesthesia Checklist: Patient identified, Suction available, Emergency Drugs available, Patient being monitored and Timeout performed Patient Re-evaluated:Patient Re-evaluated prior to inductionOxygen Delivery Method: Circle system utilized Preoxygenation: Pre-oxygenation with 100% oxygen Intubation Type: IV induction Ventilation: Mask ventilation without difficulty and Oral airway inserted - appropriate to patient size Laryngoscope Size: Miller and 2 Grade View: Grade II Tube type: Oral Tube size: 7.5 mm Number of attempts: 1 (1) Airway Equipment and Method: Stylet Placement Confirmation: ETT inserted through vocal cords under direct vision,  positive ETCO2,  CO2 detector and breath sounds checked- equal and bilateral Secured at: 21 cm Tube secured with: Tape Dental Injury: Teeth and  Oropharynx as per pre-operative assessment

## 2012-04-10 NOTE — Progress Notes (Signed)
Call to angela at Dr. Sherene Sires office. She will fax EKG to [phone].

## 2012-04-10 NOTE — Plan of Care (Signed)
Problem: Consults Goal: Diagnosis- Total Joint Replacement Primary Total Knee Right     

## 2012-04-10 NOTE — Transfer of Care (Signed)
Immediate Anesthesia Transfer of Care Note  Patient: [name]  Procedure(s) Performed: Procedure(s) (LRB) with comments: TOTAL KNEE ARTHROPLASTY (Right)  Patient Location: PACU  Anesthesia Type:General  Level of Consciousness: awake, alert , oriented and patient cooperative  Airway & Oxygen Therapy: Patient Spontanous Breathing and Patient connected to face mask oxygen  Post-op Assessment: Report given to PACU RN and Post -op Vital signs reviewed and stable  Post vital signs: Reviewed and stable  Complications: No apparent anesthesia complications

## 2012-04-10 NOTE — Interval H&P Note (Signed)
History and Physical Interval Note:  04/10/2012 11:05 AM  [name]  has presented today for surgery, with the diagnosis of DJD RT KNEE  The various methods of treatment have been discussed with the patient and family. After consideration of risks, benefits and other options for treatment, the patient has consented to  Procedure(s) (LRB) with comments: TOTAL KNEE ARTHROPLASTY (Right) as a surgical intervention .  The patient's history has been reviewed, patient examined, no change in status, stable for surgery.  I have reviewed the patient's chart and labs.  Questions were answered to the patient's satisfaction.     Salvatore Marvel A

## 2012-04-11 ENCOUNTER — Encounter (HOSPITAL_COMMUNITY): Payer: Self-pay | Admitting: Orthopedic Surgery

## 2012-04-11 DIAGNOSIS — Z86718 Personal history of other venous thrombosis and embolism: Secondary | ICD-10-CM

## 2012-04-11 DIAGNOSIS — Z6841 Body Mass Index (BMI) 40.0 and over, adult: Secondary | ICD-10-CM

## 2012-04-11 LAB — URINE CULTURE
Colony Count: NO GROWTH
Culture: NO GROWTH

## 2012-04-11 LAB — GLUCOSE, CAPILLARY
Glucose-Capillary: 200 mg/dL — ABNORMAL HIGH (ref 70–99)
Glucose-Capillary: 229 mg/dL — ABNORMAL HIGH (ref 70–99)

## 2012-04-11 LAB — CBC
MCV: 86.1 fL (ref 78.0–100.0)
Platelets: 218 10*3/uL (ref 150–400)
RBC: 4.04 MIL/uL (ref 3.87–5.11)
RDW: 13.6 % (ref 11.5–15.5)
WBC: 12.1 10*3/uL — ABNORMAL HIGH (ref 4.0–10.5)

## 2012-04-11 LAB — BASIC METABOLIC PANEL
Calcium: 8.7 mg/dL (ref 8.4–10.5)
Chloride: 100 mEq/L (ref 96–112)
Creatinine, Ser: 0.75 mg/dL (ref 0.50–1.10)
GFR calc Af Amer: >90 mL/min (ref >90)
Sodium: 136 mEq/L (ref 135–145)

## 2012-04-11 LAB — HEMOGLOBIN A1C
Hgb A1c MFr Bld: 7.1 % — ABNORMAL HIGH (ref <5.7)
Mean Plasma Glucose: 157 mg/dL — ABNORMAL HIGH (ref <117)

## 2012-04-11 MED ORDER — SODIUM CHLORIDE 0.9 % IV BOLUS (SEPSIS)
500.0000 mL | Freq: Once | INTRAVENOUS | Status: AC
  Administered 2012-04-11: 500 mL via INTRAVENOUS

## 2012-04-11 MED ORDER — FLUCONAZOLE 150 MG PO TABS
150.0000 mg | ORAL_TABLET | Freq: Every day | ORAL | Status: DC
  Administered 2012-04-11 – 2012-04-12 (×2): 150 mg via ORAL
  Filled 2012-04-11 (×3): qty 1

## 2012-04-11 NOTE — Clinical Documentation Improvement (Signed)
BMI DOCUMENTATION CLARIFICATION QUERY  THIS DOCUMENT IS NOT A PERMANENT PART OF THE MEDICAL RECORD  TO RESPOND TO THE THIS QUERY, FOLLOW THE INSTRUCTIONS BELOW:  1. If needed, update documentation for the patient's encounter via the notes activity.  2. Access this query again and click edit on the In [employer].  3. After updating, or not, click F2 to complete all highlighted (required) fields concerning your review. Select "additional documentation in the medical record" OR "no additional documentation provided".  4. Click Sign note button.  5. The deficiency will fall out of your In Basket *Please let us know if you are not able to complete this workflow by phone or e-mail (listed below).         04/11/12  Dear. Roseanne Juenger /Associates  In an effort to better capture your patient's severity of illness, reflect appropriate length of stay and utilization of resources, a review of the patient medical record has revealed the following indicators.    Based on your clinical judgment, please clarify and document in a progress note and/or discharge summary the clinical condition associated with the following supporting information:  In responding to this query please exercise your independent judgment.  The fact that a query is asked, does not imply that any particular answer is desired or expected.  Possible Clinical conditions: Obesity W/ BMI= " Estimated Body mass index is 45.84 kg"  Underweight w/BMI=  Other condition  Cannot Clinically determine   Risk Factors: Sign & Symptoms:(As per H&P) "Estimated Body mass index is 45.84 kg "  BMI is document in her H&P.  I have now put it on her problem list and her progress note Reviewed: additional documentation in the medical record  Nechuma Boven A. Gwinda Passe Physician Assistant [facility] [phone]  04/11/2012, 10:02 AM Thank Bonita Quin,  Joanette Gula Delk RN, BSN, CCDS  Clinical Documentation  Specialist: [phone] Pager Health Information Management [place]

## 2012-04-11 NOTE — Progress Notes (Signed)
UR COMPLETED  

## 2012-04-11 NOTE — Progress Notes (Addendum)
Patient ID: [name], female   DOB: Dec 17, 1945, 67 y.o.   MRN: [id number] PATIENT ID: Jacqueline GOGUEN  MRN: [id number]  DOB/AGE:  12/30/1945 / 67 y.o.  1 Day Post-Op Procedure(s) (LRB): TOTAL KNEE ARTHROPLASTY (Right)    PROGRESS NOTE Subjective: Patient is alert, oriented, no Nausea, no Vomiting, yes passing gas, no Bowel Movement. Taking PO well clear liquids. Denies SOB, Chest or Calf Pain. Using Incentive Spirometer, PAS in place. Ambulate bed to chair minimal assist, CPM 0-60 Patient reports pain as 4 on 0-10 scale  .    Objective: Vital signs in last 24 hours: Filed Vitals:   04/11/12 0000 04/11/12 0400 04/11/12 0712 04/11/12 0800  BP:   130/75   Pulse:   95   Temp:   99.2 F (37.3 C)   TempSrc:      Resp: 16 16 18 18   SpO2: 94% 94% 95%       Intake/Output from previous day: I/O last 3 completed shifts: In: 1500 [I.V.:1500] Out: 536 [Urine:485; Stool:1; Blood:50]   Intake/Output this shift: Total I/O In: 560 [P.O.:560] Out: -    LABORATORY DATA:  Basename 04/11/12 1151 04/11/12 0639 04/11/12 0535 04/10/12 2227 04/10/12 0911  WBC -- -- 12.1* -- --  HGB -- -- 11.2* -- --  HCT -- -- 34.8* -- --  PLT -- -- 218 -- --  NA -- -- 136 -- --  K -- -- 5.3* -- --  CL -- -- 100 -- --  CO2 -- -- 28 -- --  BUN -- -- 23 -- --  CREATININE -- -- 0.75 -- --  GLUCOSE -- -- 182* -- --  GLUCAP 184* 151* -- 200* --  INR -- -- -- -- 1.09  CALCIUM -- -- 8.7 -- --    Examination: ABD soft Neurovascular intact Sensation intact distally Intact pulses distally Dorsiflexion/Plantar flexion intact Incision: moderate drainage} Estimated Body mass index is 45.84 kg/(m^2) as calculated from the following:  Height as of this encounter: 5\' 6" (1.676 m).  Weight as of this encounter: 284 lb(128.822 kg).  Assessment:   1 Day Post-Op Procedure(s) (LRB): TOTAL KNEE ARTHROPLASTY (Right) ADDITIONAL DIAGNOSIS:  Acute Blood Loss Anemia Principal Problem:  *Left knee DJD Active  Problems:  Right knee DJD  Diabetes  History of pulmonary embolism  Hypertension  Sleep apnea  Chronic low back pain  BMI 45.0-49.9, adult  Obesity, Class III, BMI 40-49.9 (morbid obesity)  Plan: PT/OT WBAT, CPM 5/hrs day until ROM 0-90 degrees, then D/C CPM DVT Prophylaxis:  SCDx72hrs, ASA 325 mg BID x 2 weeks DISCHARGE PLAN: Skilled Nursing Facility/Rehab DISCHARGE NEEDS: needs FL2   wants to go to [facility] D/c NS with K due to elevated K.  Fluid bolus times 2 then saline lock IV Dressing change done by me this am.  STAT Lower extremity doppler to rule out DVT due to history of DVT and PE post op day 2 after her last total knee.      Solace Manwarren J 04/11/2012, 1:05 PM

## 2012-04-11 NOTE — Progress Notes (Signed)
VASCULAR LAB PRELIMINARY  PRELIMINARY  PRELIMINARY  PRELIMINARY  Bilateral lower extremity venous duplex  completed.    Preliminary report:  Bilateral:  No evidence of DVT, superficial thrombosis, or Baker's Cyst.    Labrisha Wuellner, RVT 04/11/2012, 1:36 PM

## 2012-04-11 NOTE — Progress Notes (Signed)
CARE MANAGEMENT NOTE 04/11/2012  Patient:  [name], [name]   Account Number:  [id number]  Date Initiated:  04/11/2012  Documentation initiated by:  Vance Peper  Subjective/Objective Assessment:   67 yr old female s/p right total knee arthroplasty.     Action/Plan:   Patient is for shortterm rehab at [facility]. Social worker Amy Riley Kill is aware.   Anticipated DC Date:  04/12/2012   Anticipated DC Plan:  SKILLED NURSING FACILITY  In-house referral  Clinical Social Worker      DC Planning Services  CM consult      Choice offered to / List presented to:             Status of service:  Completed, signed off Medicare Important Message given?   (If response is "NO", the following Medicare IM given date fields will be blank) Date Medicare IM given:   Date Additional Medicare IM given:    Discharge Disposition:  SKILLED NURSING FACILITY  Per UR Regulation:    If discussed at Long Length of Stay Meetings, dates discussed:    Comments:

## 2012-04-11 NOTE — Clinical Social Work Psychosocial (Signed)
Clinical Social Work Department  BRIEF PSYCHOSOCIAL ASSESSMENT  Patient: [name]  Account Number: [id number]  Admit date: 04/11/2012 Clinical Social Worker Date/Time: 04/11/2012  Referred by: Physician Date Referred:04/10/2012 Referred for   SNF Placement   Other Referral:  Interview type: Patient  Other interview type: Patient's husband was present PSYCHOSOCIAL DATA  Living Status: FAMILY  Admitted from facility:  Level of care:  Primary support name: Nathanial Rancher Primary support relationship to patient: husband Degree of support available:  Strong and vested  CURRENT CONCERNS  Current Concerns   Post-Acute Placement   Other Concerns:  SOCIAL WORK ASSESSMENT / PLAN  CSW met with pt re: PT recommendation for SNF.   Pt lives with her husband    CSW explained placement process and answered questions.   Pt reports [facility] is her first preference and [facility] is her second choice   CSW completed FL2 and initiated [facility] search.   Weekday CSW to f/u with offers.   Assessment/plan status:  Information/Referral to [facility]: N/A  Other assessment/ plan:  Information/referral to community resources:  SNF   PTAR   PATIENT'S/FAMILY'S RESPONSE TO PLAN OF CARE:  Pt  And pt's husband report they are agreeable to [facility] SNF in order to increase strength and independence with mobility prior to return home. Pt verbalized understanding of placement process and appreciation for CSW assist.   Sabino Niemann, MSW [phone]

## 2012-04-11 NOTE — Progress Notes (Signed)
Physical Therapy Treatment Patient Details Name: [name] MRN: [id number] DOB: [birth year] Today's Date: 04/11/2012 Time: 4098-1191 PT Time Calculation (min): 27 min  PT Assessment / Plan / Recommendation Comments on Treatment Session  pt presents with R TKA.  pt limited today by feeling nauseated.  RN made aware.  pt will need continued therapies.      Follow Up Recommendations  SNF     Does the patient have the potential to tolerate intense rehabilitation     Barriers to Discharge        Equipment Recommendations  Rolling walker with 5" wheels    Recommendations for Other Services    Frequency 7X/week   Plan Discharge plan remains appropriate;Frequency remains appropriate    Precautions / Restrictions Precautions Precautions: Knee Required Braces or Orthoses: Knee Immobilizer - Right Knee Immobilizer - Right: On except when in CPM;Discontinue once straight leg raise with < 10 degree lag Restrictions Weight Bearing Restrictions: Yes RLE Weight Bearing: Weight bearing as tolerated   Pertinent Vitals/Pain Pain 4/10 in R knee.    Mobility  Bed Mobility Bed Mobility: Not assessed Transfers Transfers: Sit to Stand;Stand to Sit Sit to Stand: 4: Min assist;With upper extremity assist;From chair/3-in-1;With armrests Stand to Sit: 4: Min assist;With upper extremity assist;To chair/3-in-1;With armrests Details for Transfer Assistance: cues for UE use, positioning LEs prior to sitting.   Ambulation/Gait Ambulation/Gait Assistance: 4: Min guard Ambulation Distance (Feet): 50 Feet Assistive device: Rolling walker Ambulation/Gait Assistance Details: cues for sequencing, positioning in RW, upright posture.   Gait Pattern: Decreased step length - left;Decreased stance time - right;Trunk flexed Stairs: No Wheelchair Mobility Wheelchair Mobility: No    Exercises Total Joint Exercises Ankle Circles/Pumps: AROM;Both;10 reps Quad Sets: AROM;Both;10 reps   PT Diagnosis:    PT  Problem List:   PT Treatment Interventions:     PT Goals Acute Rehab PT Goals Time For Goal Achievement: 04/24/12 Potential to Achieve Goals: Good PT Goal: Sit to Stand - Progress: Progressing toward goal PT Goal: Ambulate - Progress: Progressing toward goal PT Goal: Perform Home Exercise Program - Progress: Progressing toward goal  Visit Information  Last PT Received On: 04/11/12 Assistance Needed: +2    Subjective Data  Subjective: I just feel nauseated.     Cognition  Overall Cognitive Status: Appears within functional limits for tasks assessed/performed Arousal/Alertness: Awake/alert Orientation Level: Appears intact for tasks assessed Behavior During Session: [facility] for tasks performed    Balance  Balance Balance Assessed: No  End of Session PT - End of Session Equipment Utilized During Treatment: Gait belt;Right knee immobilizer Activity Tolerance:  (Limited by nausea) Patient left: in chair;with call bell/phone within reach Nurse Communication: Mobility status   GP     Sunny Schlein, [place] [phone] 04/11/2012, 9:02 AM

## 2012-04-12 DIAGNOSIS — F419 Anxiety disorder, unspecified: Secondary | ICD-10-CM | POA: Diagnosis not present

## 2012-04-12 LAB — BASIC METABOLIC PANEL
Calcium: 8.7 mg/dL (ref 8.4–10.5)
GFR calc Af Amer: >90 mL/min (ref >90)
GFR calc non Af Amer: 88 mL/min — ABNORMAL LOW (ref >90)
Potassium: 4.5 mEq/L (ref 3.5–5.1)
Sodium: 137 mEq/L (ref 135–145)

## 2012-04-12 LAB — CBC
MCH: 27.7 pg (ref 26.0–34.0)
MCHC: 32.5 g/dL (ref 30.0–36.0)
Platelets: 161 10*3/uL (ref 150–400)
RDW: 13.5 % (ref 11.5–15.5)

## 2012-04-12 LAB — GLUCOSE, CAPILLARY: Glucose-Capillary: 163 mg/dL — ABNORMAL HIGH (ref 70–99)

## 2012-04-12 MED ORDER — MUPIROCIN 2 % EX OINT: TOPICAL_OINTMENT | CUTANEOUS | Status: DC

## 2012-04-12 MED ORDER — RIVAROXABAN 10 MG PO TABS: 10.0000 mg | ORAL_TABLET | Freq: Every day | ORAL | Status: DC

## 2012-04-12 MED ORDER — OXYCODONE HCL 5 MG PO TABS: 5.0000 mg | ORAL_TABLET | ORAL | Status: DC | PRN

## 2012-04-12 MED ORDER — ONDANSETRON HCL 4 MG PO TABS: 4.0000 mg | ORAL_TABLET | Freq: Four times a day (QID) | ORAL | Status: DC | PRN

## 2012-04-12 MED ORDER — ALPRAZOLAM 0.25 MG PO TABS
0.2500 mg | ORAL_TABLET | Freq: Three times a day (TID) | ORAL | Status: DC | PRN
  Administered 2012-04-12 (×2): 0.25 mg via ORAL
  Filled 2012-04-12 (×2): qty 1

## 2012-04-12 MED ORDER — METFORMIN HCL 500 MG PO TABS: 500.0000 mg | ORAL_TABLET | Freq: Two times a day (BID) | ORAL | Status: DC

## 2012-04-12 MED ORDER — BISACODYL 5 MG PO TBEC: DELAYED_RELEASE_TABLET | ORAL | Status: DC

## 2012-04-12 MED ORDER — METFORMIN HCL 500 MG PO TABS
1000.0000 mg | ORAL_TABLET | Freq: Every day | ORAL | Status: DC
  Filled 2012-04-12: qty 2

## 2012-04-12 MED ORDER — METFORMIN HCL 500 MG PO TABS
500.0000 mg | ORAL_TABLET | Freq: Every day | ORAL | Status: DC
  Administered 2012-04-12: 500 mg via ORAL
  Filled 2012-04-12 (×2): qty 1

## 2012-04-12 MED ORDER — DSS 100 MG PO CAPS: ORAL_CAPSULE | ORAL | Status: DC

## 2012-04-12 MED ORDER — ALPRAZOLAM 0.25 MG PO TABS: 0.2500 mg | ORAL_TABLET | Freq: Three times a day (TID) | ORAL | Status: DC | PRN

## 2012-04-12 MED ORDER — ZOLPIDEM TARTRATE 10 MG PO TABS: 10.0000 mg | ORAL_TABLET | Freq: Every evening | ORAL | Status: AC | PRN

## 2012-04-12 MED ORDER — CELECOXIB 200 MG PO CAPS: 200.0000 mg | ORAL_CAPSULE | Freq: Every day | ORAL | Status: DC

## 2012-04-12 NOTE — Evaluation (Signed)
Occupational Therapy Evaluation Patient Details Name: [name] MRN: [id number] DOB: [birth year] Today's Date: 04/12/2012 Time: 3086-5784 OT Time Calculation (min): 20 min  OT Assessment / Plan / Recommendation Clinical Impression  This 67 year old female was admitted for L TKa.  She is appropriate for skilled OT to increase independence with adls.  Goals in acute are min guard to min A.      OT Assessment  Patient needs continued OT Services    Follow Up Recommendations  SNF    Barriers to Discharge      Equipment Recommendations  None recommended by OT    Recommendations for Other Services    Frequency  Min 2X/week    Precautions / Restrictions Precautions Precautions: Knee Required Braces or Orthoses: Knee Immobilizer - Right Knee Immobilizer - Right: On except when in CPM;Discontinue once straight leg raise with < 10 degree lag Restrictions RLE Weight Bearing: Weight bearing as tolerated   Pertinent Vitals/Pain 8/10 L knee.  Repositioned. Pt due for pain meds in 30 minutes    ADL  Lower Body Bathing: Simulated;Moderate assistance Where Assessed - Lower Body Bathing: Supported sit to stand Lower Body Dressing: Simulated;Maximal assistance Where Assessed - Lower Body Dressing: Supported sit to stand Toilet Transfer: Mining engineer Method: Sit to Barista:  (chair) Toileting - Architect and Hygiene: Simulated;Minimal assistance Where Assessed - Engineer, mining and Hygiene: Sit to stand from 3-in-1 or toilet Equipment Used: Rolling walker Transfers/Ambulation Related to ADLs: Pt ambulated to nursing station, at her request.  She states she will walk again with PT ADL Comments: Pt has a reacher at home--explained adl uses    OT Diagnosis: Generalized weakness  OT Problem List: Decreased strength;Decreased activity tolerance;Decreased knowledge of use of DME or AE;Impaired UE functional  use;Pain OT Treatment Interventions: Self-care/ADL training;DME and/or AE instruction;Therapeutic activities;Patient/family education   OT Goals Acute Rehab OT Goals OT Goal Formulation: With patient Time For Goal Achievement: 04/19/12 Potential to Achieve Goals: Good ADL Goals Pt Will Perform Lower Body Bathing: with min assist;Sit to stand from chair;with adaptive equipment ADL Goal: Lower Body Bathing - Progress: Goal set today Pt Will Transfer to Toilet: with min assist;Ambulation;3-in-1 (min guard including sit to stand) ADL Goal: Toilet Transfer - Progress: Goal set today Pt Will Perform Toileting - Clothing Manipulation: with min assist;Sitting on 3-in-1 or toilet;Standing (including sit to stand) ADL Goal: Toileting - Clothing Manipulation - Progress: Goal set today  Visit Information  Last OT Received On: 04/12/12 Assistance Needed: +2    Subjective Data  Subjective: what can I do to be able to lift my leg.  kristin said I was the only one not doing it Patient Stated Goal: rehab at [facility]   Prior Functioning     Home Living Available Help at Discharge: Skilled Nursing Facility Bathroom Shower/Tub: Walk-in shower Bathroom Toilet: Standard Home Adaptive Equipment: Straight cane;Shower chair with back Prior Function Level of Independence: Independent Driving: Yes Vocation: Full time employment Communication Communication: No difficulties         Vision/Perception     Cognition  Overall Cognitive Status: Appears within functional limits for tasks assessed/performed Arousal/Alertness: Awake/alert Orientation Level: Appears intact for tasks assessed Behavior During Session: [facility] for tasks performed    Extremity/Trunk Assessment Right Upper Extremity Assessment RUE ROM/Strength/Tone: [facility] for tasks assessed Left Upper Extremity Assessment LUE ROM/Strength/Tone: WFL for tasks assessed     Mobility Bed Mobility Sit to Supine: 4: Min assist Transfers  Sit to  Stand: 4: Min assist;With upper extremity assist;From chair/3-in-1;With armrests Details for Transfer Assistance: cues for UE placement     Shoulder Instructions     Exercise     Balance     End of Session OT - End of Session Activity Tolerance: Patient tolerated treatment well Patient left: in bed;with call bell/phone within reach  GO     Correne Lalani 04/12/2012, 1:18 PM Marica Otter, OTR/L [phone] 04/12/2012

## 2012-04-12 NOTE — Progress Notes (Signed)
Subjective: 2 Days Post-Op Procedure(s) (LRB): TOTAL KNEE ARTHROPLASTY (Right) Patient reports pain as 8 on 0-10 scale.    Objective: Vital signs in last 24 hours: Temp:  [98.2 F (36.8 C)-99.2 F (37.3 C)] 98.5 F (36.9 C) (01/08 0005) Pulse Rate:  [78-89] 89  (01/07 2045) Resp:  [16-18] 16  (01/07 2045) BP: (134-137)/(52-75) 137/75 mmHg (01/07 2045) SpO2:  [98 %-99 %] 98 % (01/07 2045) FiO2 (%):  [2 %] 2 % (01/07 1400)  Intake/Output from previous day: 01/07 0701 - 01/08 0700 In: 800 [P.O.:800] Out: -  Intake/Output this shift:     Basename 04/12/12 0600 04/11/12 0535  HGB 9.9* 11.2*    Basename 04/12/12 0600 04/11/12 0535  WBC 6.3 12.1*  RBC 3.58* 4.04  HCT 30.5* 34.8*  PLT 161 218    Basename 04/11/12 0535  NA 136  K 5.3*  CL 100  CO2 28  BUN 23  CREATININE 0.75  GLUCOSE 182*  CALCIUM 8.7    Basename 04/10/12 0911  LABPT --  INR 1.09    ABD soft Neurovascular intact Sensation intact distally Intact pulses distally Dorsiflexion/Plantar flexion intact Incision: moderate drainage  Assessment/Plan: 2 Days Post-Op Procedure(s) (LRB): TOTAL KNEE ARTHROPLASTY (Right) Up with therapy Plan for discharge tomorrow Discharge to SNF Patient is anxious  Will order xanax   Pascal Lux 04/12/2012, 7:48 AM

## 2012-04-12 NOTE — Discharge Summary (Signed)
Patient ID: [name] MRN: [id number] DOB/AGE: December 28, 1945 67 y.o.  Admit date: 04/10/2012 Discharge date: 04/12/2012  Admission Diagnoses:  Principal Problem:  *Left knee DJD Active Problems:  Right knee DJD  Diabetes  History of pulmonary embolism  Hypertension  Sleep apnea  Chronic low back pain  BMI 45.0-49.9, adult  Obesity, Class III, BMI 40-49.9 (morbid obesity)  Anxiety   Discharge Diagnoses:  Same  Past Medical History  Diagnosis Date  . Right knee DJD   . Diabetes   . History of pulmonary embolism   . Hypertension   . High cholesterol   . Pulmonary embolism   . Pneumonia   . Bell's palsy   . Sleep apnea   . PONV (postoperative nausea and vomiting)   . 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 Subcutaneous hematoma left leg just outside knee joint 04/10/2012    Culture was done of this fluid collection is pending  Gram stain had white cells but no organism    Surgeries: Procedure(s): TOTAL KNEE ARTHROPLASTY on 04/10/2012   Consultants:    Discharged Condition: Improved  Hospital Course: RENLEY GUTMAN is an 67 y.o. female who was admitted 04/10/2012 for operative treatment ofLeft knee DJD. Patient has severe unremitting pain that affects sleep, daily activities, and work/hobbies. After pre-op clearance the patient was taken to the operating room on 04/10/2012 and underwent  Procedure(s): TOTAL KNEE ARTHROPLASTY.    Patient was given perioperative antibiotics: Anti-infectives     Start     Dose/Rate Route Frequency Ordered Stop   04/11/12 1700   fluconazole (DIFLUCAN) tablet 150 mg        150 mg Oral Daily 04/11/12 1700     04/10/12 2300   vancomycin (VANCOCIN) IVPB 1000 mg/200 mL premix        1,000 mg 200 mL/hr over 60 Minutes Intravenous Every 12 hours 04/10/12 1714 04/10/12 2331   04/10/12 1200   cefUROXime (ZINACEF) injection  Status:  Discontinued          As needed 04/10/12 1200 04/10/12 1356   04/10/12 0905   vancomycin (VANCOCIN) 1,500 mg in sodium chloride  0.9 % 500 mL IVPB     Comments: Pt has history of MRSA and has no known drug allergies.  Please give both Vanc and Ancef preop. Mailey Landstrom A. Gwinda Passe Physician Assistant [facility] [phone]  04/03/2012, 1:55 PM      1,500 mg 250 mL/hr over 120 Minutes Intravenous 60 min pre-op 04/10/12 0905     04/09/12 1353   ceFAZolin (ANCEF) 3 g in dextrose 5 % 50 mL IVPB        3 g 160 mL/hr over 30 Minutes Intravenous 60 min pre-op 04/09/12 1353 04/10/12 1113           Patient was given sequential compression devices, early ambulation, and chemoprophylaxis to prevent DVT.  Patient benefited maximally from hospital stay and there were no complications.    Recent vital signs: Patient Vitals for the past 24 hrs:  BP Temp Temp src Pulse Resp SpO2  04/12/12 0005 - 98.5 F (36.9 C) - - - -  04/11/12 2045 137/75 mmHg 99.2 F (37.3 C) Oral 89  16  98 %  04/11/12 1550 - - - - 18  -  04/11/12 1400 134/52 mmHg 98.2 F (36.8 C) Oral 78  18  99 %  04/11/12 1200 - - - - 18  -     Recent laboratory studies:  Basename 04/12/12 0600  04/11/12 0535 04/10/12 0911  WBC 6.3 12.1* --  HGB 9.9* 11.2* --  HCT 30.5* 34.8* --  PLT 161 218 --  NA 137 136 --  K 4.5 5.3* --  CL 102 100 --  CO2 28 28 --  BUN 20 23 --  CREATININE 0.72 0.75 --  GLUCOSE 192* 182* --  INR -- -- 1.09  CALCIUM 8.7 -- --     Discharge Medications:     Medication List     As of 04/12/2012 11:49 AM    TAKE these medications         ALPRAZolam 0.25 MG tablet   Commonly known as: XANAX   Take 1 tablet (0.25 mg total) by mouth 3 (three) times daily as needed for anxiety.      aspirin EC 81 MG tablet   Take 81 mg by mouth daily.      bisacodyl 5 MG EC tablet   Commonly known as: DULCOLAX   Take 2 tablets every night with dinner until bowel movement.  LAXITIVE.  Restart if two days since last bowel movement      celecoxib 200 MG capsule   Commonly known as: CELEBREX   Take 1 capsule  (200 mg total) by mouth daily. For pain      DSS 100 MG Caps   1 tab 2 times a day while on narcotics.  STOOL SOFTENER      levothyroxine 200 MCG tablet   Commonly known as: SYNTHROID, LEVOTHROID   Take 200 mcg by mouth daily.      lisinopril 10 MG tablet   Commonly known as: PRINIVIL,ZESTRIL   Take 10 mg by mouth daily.      metFORMIN 1000 MG tablet   Commonly known as: GLUCOPHAGE   Take 500-1,000 mg by mouth 2 (two) times daily with a meal. 500mg  in the morning and 1000mg  at night      mupirocin ointment 2 %   Commonly known as: BACTROBAN   Use BID intranasally for 14 days.  Stop date is 04/24/2012      ondansetron 4 MG tablet   Commonly known as: ZOFRAN   Take 1 tablet (4 mg total) by mouth every 6 (six) hours as needed for nausea.      oxyCODONE 5 MG immediate release tablet   Commonly known as: Oxy IR/ROXICODONE   Take 1-2 tablets (5-10 mg total) by mouth every 3 (three) hours as needed for pain.      rivaroxaban 10 MG Tabs tablet   Commonly known as: XARELTO   Take 1 tablet (10 mg total) by mouth daily. Increase to 20mg  daily on 04/24/2012      simvastatin 40 MG tablet   Commonly known as: ZOCOR   Take 40 mg by mouth daily.      zolpidem 10 MG tablet   Commonly known as: AMBIEN   Take 1 tablet (10 mg total) by mouth at bedtime as needed for sleep. For sleep        Diagnostic Studies: Dg Chest 2 View  03/31/2012  *RADIOLOGY REPORT*  Clinical Data: Degenerative arthritis of the right knee. Preoperative respiratory exam.  CHEST - 2 VIEW  Comparison: Chest x-ray dated 05/26/2011  Findings: The patient has chronic peribronchial thickening.  Heart size and vascularity are normal and the lungs are clear of infiltrates.  No effusions.  No acute osseous abnormality.  IMPRESSION: Chronic bronchitic changes.  No acute abnormalities.   Original Report Authenticated By: Francene Boyers, M.D.  Disposition:       Discharge Orders    Future Orders Please Complete By Expires    Diet - low sodium heart healthy      Call MD / Call 911      Comments:   If you experience chest pain or shortness of breath, CALL 911 and be transported to the hospital emergency room.  If you develope a fever above 101 F, pus (white drainage) or increased drainage or redness at the wound, or calf pain, call your surgeon's office.   Discharge instructions      Comments:   Total Knee Replacement Care After Refer to this sheet in the next few weeks. These discharge instructions provide you with general information on caring for yourself after you leave the hospital. Your caregiver may also give you specific instructions. Your treatment has been planned according to the most current medical practices available, but unavoidable complications sometimes occur. If you have any problems or questions after discharge, please call your caregiver. Regaining a near full range of motion of your knee within the first 3 to 6 weeks after surgery is critical. HOME CARE INSTRUCTIONS  You may resume a normal diet and activities as directed.  Perform exercises as directed.  Place yellow foam block, yellow side up under heel at all times except when in CPM or when walking.  DO NOT modify, tear, cut, or change in any way. You will receive physical therapy daily  Take showers instead of baths until informed otherwise.  Change bandages (dressings)daily Do not take over-the-counter or prescription medicines for pain, discomfort, or fever. Eat a well-balanced diet.  Avoid lifting or driving until you are instructed otherwise.  Make an appointment to see your caregiver for stitches (suture) or staple removal as directed.  If you have been sent home with a continuous passive motion machine (CPM machine), 0-90 degrees 6 hrs a day   2 hrs a shift SEEK MEDICAL CARE IF: You have swelling of your calf or leg.  You develop shortness of breath or chest pain.  You have redness, swelling, or increasing pain in the wound.    There is pus or any unusual drainage coming from the surgical site.  You notice a bad smell coming from the surgical site or dressing.  The surgical site breaks open after sutures or staples have been removed.  There is persistent bleeding from the suture or staple line.  You are getting worse or are not improving.  You have any other questions or concerns.  SEEK IMMEDIATE MEDICAL CARE IF:  You have a fever.  You develop a rash.  You have difficulty breathing.  You develop any reaction or side effects to medicines given.  Your knee motion is decreasing rather than improving.  MAKE SURE YOU:  Understand these instructions.  Will watch your condition.  Will get help right away if you are not doing well or get worse.   Constipation Prevention      Comments:   Drink plenty of fluids.  Prune juice may be helpful.  You may use a stool softener, such as Colace (over the counter) 100 mg twice a day.  Use MiraLax (over the counter) for constipation as needed.   Increase activity slowly as tolerated      CPM      Comments:   Continuous passive motion machine (CPM):      Use the CPM from 0 to 90 for 6 hours per day.  You may break it up into 2 or 3 sessions per day.      Use CPM for 2 weeks or until you are told to stop.   TED hose      Comments:   Use stockings (TED hose) for 2 weeks on both leg(s).  You may remove them at night for sleeping.   Change dressing      Comments:   Change the dressing daily with sterile 4 x 4 inch gauze dressing and apply TED hose.  You may clean the incision with alcohol prior to redressing.   Do not put a pillow under the knee. Place it under the heel.      Comments:   Place yellow foam block, yellow side up under heel at all times except when in CPM or when walking.  DO NOT modify, tear, cut, or change in any way the yellow foam block.      Follow-up Information    Follow up with Nilda Simmer, MD. On 04/25/2012. (appt time 10:15)    Contact  information:   [address] [place] [address] [phone]           Signed: Pascal Lux 04/12/2012, 11:49 AM

## 2012-04-12 NOTE — Progress Notes (Signed)
Physical Therapy Treatment Patient Details Name: [name] MRN: [id number] DOB: 11-18-1945 Today's Date: 04/12/2012 Time: 4098-1191 PT Time Calculation (min): 19 min  PT Assessment / Plan / Recommendation Comments on Treatment Session  pt presents with R TKA.  pt upset and frustrated that she can't do a SLR and PA told her she was only one who couldn't do SLR.  Listened to pt's concerns and offered support.  EMS arrived during mobility for pt to transfer to SNF.      Follow Up Recommendations  SNF     Does the patient have the potential to tolerate intense rehabilitation     Barriers to Discharge        Equipment Recommendations  Rolling walker with 5" wheels    Recommendations for Other Services    Frequency 7X/week   Plan Discharge plan remains appropriate;Frequency remains appropriate    Precautions / Restrictions Precautions Precautions: Knee Required Braces or Orthoses: Knee Immobilizer - Right Knee Immobilizer - Right: On except when in CPM;Discontinue once straight leg raise with < 10 degree lag Restrictions Weight Bearing Restrictions: Yes RLE Weight Bearing: Weight bearing as tolerated   Pertinent Vitals/Pain Indicates painful, but premedicated.      Mobility  Bed Mobility Bed Mobility: Supine to Sit;Sitting - Scoot to Edge of Bed;Sit to Supine Supine to Sit: 4: Min assist Sitting - Scoot to [place] of Bed: 4: Min assist Sit to Supine: 4: Min assist Details for Bed Mobility Assistance: A with R LE only Transfers Transfers: Sit to Stand;Stand to Sit Sit to Stand: 4: Min assist;With upper extremity assist;From bed Stand to Sit: 4: Min assist;With upper extremity assist (to stretcher) Details for Transfer Assistance: cues for UE use and positioning of LEs.   Ambulation/Gait Ambulation/Gait Assistance: 4: Min guard Ambulation Distance (Feet): 20 Feet Assistive device: Rolling walker Ambulation/Gait Assistance Details: cues for upright posture and increasing  step length on L LE.   Gait Pattern: Decreased step length - left;Decreased stance time - right;Trunk flexed Stairs: No Wheelchair Mobility Wheelchair Mobility: No    Exercises Total Joint Exercises Straight Leg Raises: AAROM;5 reps;Right   PT Diagnosis:    PT Problem List:   PT Treatment Interventions:     PT Goals Acute Rehab PT Goals Time For Goal Achievement: 04/24/12 Potential to Achieve Goals: Good PT Goal: Supine/Side to Sit - Progress: Progressing toward goal PT Goal: Sit to Supine/Side - Progress: Progressing toward goal PT Goal: Sit to Stand - Progress: Progressing toward goal PT Goal: Ambulate - Progress: Progressing toward goal PT Goal: Perform Home Exercise Program - Progress: Progressing toward goal  Visit Information  Last PT Received On: 04/12/12 Assistance Needed: +1    Subjective Data  Subjective: How can I pick my leg up when it hurts so bad???   Cognition  Overall Cognitive Status: Appears within functional limits for tasks assessed/performed Arousal/Alertness: Awake/alert Orientation Level: Appears intact for tasks assessed Behavior During Session: [facility] for tasks performed    Balance  Balance Balance Assessed: No  End of Session PT - End of Session Equipment Utilized During Treatment: Gait belt;Right knee immobilizer Activity Tolerance: Patient limited by pain Patient left:  (On stretcher with EMS) Nurse Communication: Mobility status   GP     RitenourAlison Murray, [place] [phone] 04/12/2012, 3:01 PM

## 2012-11-28 ENCOUNTER — Other Ambulatory Visit: Payer: Self-pay | Admitting: Orthopedic Surgery

## 2012-11-28 ENCOUNTER — Other Ambulatory Visit (HOSPITAL_COMMUNITY): Payer: Self-pay | Admitting: Orthopedic Surgery

## 2012-11-28 DIAGNOSIS — M79604 Pain in right leg: Secondary | ICD-10-CM

## 2012-11-28 DIAGNOSIS — M79661 Pain in right lower leg: Secondary | ICD-10-CM

## 2012-11-29 ENCOUNTER — Ambulatory Visit (HOSPITAL_COMMUNITY)
Admission: RE | Admit: 2012-11-29 | Discharge: 2012-11-29 | Disposition: A | Discharge status: Home / Self Care | Payer: Medicare Other | Source: Ambulatory Visit | Attending: Orthopedic Surgery | Admitting: Orthopedic Surgery

## 2012-11-29 DIAGNOSIS — Z96659 Presence of unspecified artificial knee joint: Secondary | ICD-10-CM | POA: Insufficient documentation

## 2012-11-29 DIAGNOSIS — R609 Edema, unspecified: Secondary | ICD-10-CM | POA: Insufficient documentation

## 2012-11-29 DIAGNOSIS — M79604 Pain in right leg: Secondary | ICD-10-CM

## 2012-11-29 DIAGNOSIS — M79609 Pain in unspecified limb: Secondary | ICD-10-CM | POA: Insufficient documentation

## 2012-11-29 LAB — CREATININE, SERUM
GFR calc Af Amer: 73 mL/min — ABNORMAL LOW (ref >90)
GFR calc non Af Amer: 63 mL/min — ABNORMAL LOW (ref >90)

## 2012-11-29 MED ORDER — GADOBENATE DIMEGLUMINE 529 MG/ML IV SOLN
20.0000 mL | Freq: Once | INTRAVENOUS | Status: AC | PRN
  Administered 2012-11-29: 20 mL via INTRAVENOUS

## 2012-12-01 ENCOUNTER — Ambulatory Visit (HOSPITAL_COMMUNITY)
Admission: RE | Admit: 2012-12-01 | Discharge: 2012-12-01 | Disposition: A | Discharge status: Home / Self Care | Payer: Medicare Other | Source: Ambulatory Visit | Attending: Physician Assistant | Admitting: Physician Assistant

## 2012-12-01 ENCOUNTER — Other Ambulatory Visit (HOSPITAL_COMMUNITY): Payer: Self-pay | Admitting: Vascular Surgery

## 2012-12-01 ENCOUNTER — Other Ambulatory Visit: Payer: Self-pay | Admitting: Family Medicine

## 2012-12-01 DIAGNOSIS — I2699 Other pulmonary embolism without acute cor pulmonale: Secondary | ICD-10-CM

## 2012-12-01 DIAGNOSIS — M25561 Pain in right knee: Secondary | ICD-10-CM

## 2012-12-01 DIAGNOSIS — M7989 Other specified soft tissue disorders: Secondary | ICD-10-CM

## 2012-12-01 DIAGNOSIS — M79609 Pain in unspecified limb: Secondary | ICD-10-CM

## 2012-12-01 DIAGNOSIS — R921 Mammographic calcification found on diagnostic imaging of breast: Secondary | ICD-10-CM

## 2012-12-01 NOTE — Progress Notes (Signed)
*  PRELIMINARY RESULTS* Vascular Ultrasound Right lower extremity venous duplex has been completed.  Preliminary findings: negative for DVT and baker's cyst.    Farrel Demark, RDMS, RVT  12/01/2012, 5:29 PM

## 2012-12-13 ENCOUNTER — Other Ambulatory Visit: Payer: Self-pay | Admitting: Family Medicine

## 2012-12-13 ENCOUNTER — Ambulatory Visit
Admission: RE | Admit: 2012-12-13 | Discharge: 2012-12-13 | Disposition: A | Discharge status: Home / Self Care | Payer: Medicare Other | Source: Ambulatory Visit | Attending: Family Medicine | Admitting: Family Medicine

## 2012-12-13 DIAGNOSIS — R921 Mammographic calcification found on diagnostic imaging of breast: Secondary | ICD-10-CM

## 2012-12-13 DIAGNOSIS — C50919 Malignant neoplasm of unspecified site of unspecified female breast: Secondary | ICD-10-CM

## 2012-12-13 HISTORY — DX: Malignant neoplasm of unspecified site of unspecified female breast: C50.919

## 2012-12-14 ENCOUNTER — Other Ambulatory Visit: Payer: Self-pay | Admitting: Family Medicine

## 2012-12-14 DIAGNOSIS — R921 Mammographic calcification found on diagnostic imaging of breast: Secondary | ICD-10-CM

## 2012-12-15 ENCOUNTER — Ambulatory Visit
Admission: RE | Admit: 2012-12-15 | Discharge: 2012-12-15 | Disposition: A | Discharge status: Home / Self Care | Payer: Medicare Other | Source: Ambulatory Visit | Attending: Family Medicine | Admitting: Family Medicine

## 2012-12-15 ENCOUNTER — Other Ambulatory Visit: Payer: Self-pay | Admitting: Family Medicine

## 2012-12-15 DIAGNOSIS — R921 Mammographic calcification found on diagnostic imaging of breast: Secondary | ICD-10-CM

## 2012-12-15 DIAGNOSIS — D0512 Intraductal carcinoma in situ of left breast: Secondary | ICD-10-CM

## 2012-12-18 ENCOUNTER — Telehealth: Payer: Self-pay | Admitting: *Deleted

## 2012-12-18 DIAGNOSIS — C50422 Malignant neoplasm of upper-outer quadrant of left male breast: Secondary | ICD-10-CM

## 2012-12-18 DIAGNOSIS — C50429 Malignant neoplasm of upper-outer quadrant of unspecified male breast: Secondary | ICD-10-CM | POA: Insufficient documentation

## 2012-12-18 NOTE — Telephone Encounter (Signed)
Confirmed [facility] for 12/20/12 at 0800 .  Instructions and contact information given. 

## 2012-12-19 ENCOUNTER — Ambulatory Visit
Admission: RE | Admit: 2012-12-19 | Discharge: 2012-12-19 | Disposition: A | Discharge status: Home / Self Care | Payer: Medicare Other | Source: Ambulatory Visit | Attending: Family Medicine | Admitting: Family Medicine

## 2012-12-19 DIAGNOSIS — D0512 Intraductal carcinoma in situ of left breast: Secondary | ICD-10-CM

## 2012-12-19 MED ORDER — GADOBENATE DIMEGLUMINE 529 MG/ML IV SOLN
20.0000 mL | Freq: Once | INTRAVENOUS | Status: AC | PRN
  Administered 2012-12-19: 20 mL via INTRAVENOUS

## 2012-12-20 ENCOUNTER — Encounter: Payer: Self-pay | Admitting: *Deleted

## 2012-12-20 ENCOUNTER — Other Ambulatory Visit (HOSPITAL_BASED_OUTPATIENT_CLINIC_OR_DEPARTMENT_OTHER): Payer: Medicare Other | Admitting: Lab

## 2012-12-20 ENCOUNTER — Ambulatory Visit
Admission: RE | Admit: 2012-12-20 | Discharge: 2012-12-20 | Disposition: A | Discharge status: Home / Self Care | Payer: Medicare Other | Source: Ambulatory Visit | Attending: Radiation Oncology | Admitting: Radiation Oncology

## 2012-12-20 ENCOUNTER — Ambulatory Visit (HOSPITAL_BASED_OUTPATIENT_CLINIC_OR_DEPARTMENT_OTHER): Payer: Medicare Other | Admitting: Surgery

## 2012-12-20 ENCOUNTER — Other Ambulatory Visit (INDEPENDENT_AMBULATORY_CARE_PROVIDER_SITE_OTHER): Payer: Self-pay | Admitting: Surgery

## 2012-12-20 ENCOUNTER — Encounter: Payer: Self-pay | Admitting: Radiation Oncology

## 2012-12-20 ENCOUNTER — Ambulatory Visit (HOSPITAL_BASED_OUTPATIENT_CLINIC_OR_DEPARTMENT_OTHER): Payer: Medicare Other | Admitting: Oncology

## 2012-12-20 ENCOUNTER — Ambulatory Visit (HOSPITAL_BASED_OUTPATIENT_CLINIC_OR_DEPARTMENT_OTHER): Payer: Medicare Other

## 2012-12-20 ENCOUNTER — Telehealth: Payer: Self-pay | Admitting: Oncology

## 2012-12-20 ENCOUNTER — Encounter: Payer: Self-pay | Admitting: Oncology

## 2012-12-20 ENCOUNTER — Ambulatory Visit: Payer: Medicare Other | Admitting: Physical Therapy

## 2012-12-20 ENCOUNTER — Other Ambulatory Visit: Payer: Self-pay | Admitting: Oncology

## 2012-12-20 VITALS — BP 152/97 | HR 93 | Temp 97.9°F | Resp 20 | Ht 66.0 in | Wt 279.2 lb

## 2012-12-20 DIAGNOSIS — C50412 Malignant neoplasm of upper-outer quadrant of left female breast: Secondary | ICD-10-CM

## 2012-12-20 DIAGNOSIS — C50422 Malignant neoplasm of upper-outer quadrant of left male breast: Secondary | ICD-10-CM

## 2012-12-20 DIAGNOSIS — C50419 Malignant neoplasm of upper-outer quadrant of unspecified female breast: Secondary | ICD-10-CM | POA: Insufficient documentation

## 2012-12-20 DIAGNOSIS — Z170 Estrogen receptor positive status [ER+]: Secondary | ICD-10-CM | POA: Insufficient documentation

## 2012-12-20 DIAGNOSIS — C50912 Malignant neoplasm of unspecified site of left female breast: Secondary | ICD-10-CM

## 2012-12-20 DIAGNOSIS — C50919 Malignant neoplasm of unspecified site of unspecified female breast: Secondary | ICD-10-CM

## 2012-12-20 LAB — CBC WITH DIFFERENTIAL/PLATELET
BASO%: 1.1 % (ref 0.0–2.0)
EOS%: 1.7 % (ref 0.0–7.0)
HGB: 12.9 g/dL (ref 11.6–15.9)
MCH: 27.4 pg (ref 25.1–34.0)
MCHC: 33.1 g/dL (ref 31.5–36.0)
RDW: 14.1 % (ref 11.2–14.5)
lymph#: 1.2 10*3/uL (ref 0.9–3.3)

## 2012-12-20 LAB — COMPREHENSIVE METABOLIC PANEL (CC13)
ALT: 8 U/L (ref 0–55)
AST: 13 U/L (ref 5–34)
Albumin: 3.6 g/dL (ref 3.5–5.0)
Calcium: 9.3 mg/dL (ref 8.4–10.4)
Chloride: 104 mEq/L (ref 98–109)
Potassium: 4.2 mEq/L (ref 3.5–5.1)
Sodium: 142 mEq/L (ref 136–145)

## 2012-12-20 NOTE — Progress Notes (Signed)
Radiation Oncology         [phone] ________________________________  Initial Outpatient Consultation  Name: [name] MRN: [id number]  Date: 12/20/2012  DOB: Mar 23, 1946  BJ:YNWGNFA,OZHYQ, MD  Kandis Cocking, MD   REFERRING PHYSICIAN: Kandis Cocking, MD  DIAGNOSIS: DCIS of the L breast, ER/PR +, UOQ  HISTORY OF PRESENT ILLNESS::[name] is a 67 y.o. female who was found to have suspicious calcifications in the L breast UOQ on mammogram.  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  She had an MRI of her breasts that showed biopsy changes, otherwise unremarkable.   She is seeing her orthopedic doctor this PM due to pain/ swelling in lower right leg - doppler was negative per patient, and she had knee surgery months ago.   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    PAST SURGICAL HISTORY: Past Surgical History  Procedure Laterality Date  . Knee arthroplasty  2007    left  . Abdominal hysterectomy  1981  . Thyroidectomy  2010  . Total knee arthroplasty  04/10/2012    Procedure: TOTAL KNEE ARTHROPLASTY;  Surgeon: Nilda Simmer, MD;  Location: [facility] OR;  Service: Orthopedics;  Laterality: Right;    FAMILY HISTORY: family history includes Cancer in her father; Diabetes in her father and son; Heart attack in her mother; Heart disease in her father and mother; Hyperlipidemia in her father and son; Hypertension in her father, mother, and son; Other in her mother and son.  SOCIAL HISTORY:  reports that she has never smoked. She has never used smokeless tobacco. She reports that she does not drink alcohol or use illicit drugs.  ALLERGIES: Review of  patient's allergies indicates no known allergies.  MEDICATIONS:  Current Outpatient Prescriptions  Medication Sig Dispense Refill  . ALPRAZolam (XANAX) 0.25 MG tablet Take 1 tablet (0.25 mg total) by mouth 3 (three) times daily as needed for anxiety.  60 tablet  0  . aspirin EC 81 MG tablet Take 81 mg by mouth daily.      . bisacodyl (DULCOLAX) 5 MG EC tablet Take 2 tablets every night with dinner until bowel movement.  LAXITIVE.  Restart if two days since last bowel movement  30 tablet  0  . celecoxib (CELEBREX) 200 MG capsule Take 1 capsule (200 mg total) by mouth daily. For pain  30 capsule  0  . docusate sodium 100 MG CAPS 1 tab 2 times a day while on narcotics.  STOOL SOFTENER  60 capsule  0  . levothyroxine (SYNTHROID, LEVOTHROID) 200 MCG tablet Take 200 mcg by mouth daily.      Marland Kitchen lisinopril (PRINIVIL,ZESTRIL) 10 MG tablet Take 10 mg by mouth daily.      . metFORMIN (GLUCOPHAGE) 1000 MG tablet Take 500-1,000 mg by mouth 2 (two) times daily with a meal. 500mg  in the morning and 1000mg  at night      . mupirocin ointment (BACTROBAN) 2 % Use BID intranasally for 14 days.  Stop date is 04/24/2012  22 g  0  . ondansetron (ZOFRAN) 4 MG tablet Take 1 tablet (4 mg total) by mouth every 6 (six) hours as needed for nausea.  30 tablet  0  . oxyCODONE (OXY  IR/ROXICODONE) 5 MG immediate release tablet Take 1-2 tablets (5-10 mg total) by mouth every 3 (three) hours as needed for pain.  100 tablet  0  . Rivaroxaban (XARELTO) 10 MG TABS tablet Take 1 tablet (10 mg total) by mouth daily. Increase to 20mg  daily on 04/24/2012  30 tablet  0  . simvastatin (ZOCOR) 40 MG tablet Take 40 mg by mouth daily.      Marland Kitchen zolpidem (AMBIEN) 10 MG tablet Take 1 tablet (10 mg total) by mouth at bedtime as needed for sleep. For sleep  30 tablet  0   No current facility-administered medications for this encounter.    REVIEW OF SYSTEMS:    Pertinent items are noted in HPI.   PHYSICAL EXAM:   Vitals with Age-Percentiles  12/20/2012  Length 167.6 cm  Systolic 152  Diastolic 97  Pulse 93  Respiration 20  Weight 126.644 kg  BMI 45.2  VISIT REPORT    General: Alert and oriented, in no acute distress HEENT: Head is normocephalic. Extraocular movements are intact.  Neck: Neck is supple, no palpable cervical or supraclavicular lymphadenopathy. Lower neck - horizontal scar from prior thyroid surgery. Heart: Regular in rate and rhythm with no murmurs, rubs, or gallops. Chest: Clear to auscultation bilaterally, with no rhonchi, wheezes, or rales. Abdomen: Soft, nontender, nondistended, with no rigidity or guarding. Extremities:bilateral leg edema R>L Lymphatics: No concerning lymphadenopathy. Neurologic: Cranial nerves II through XII are grossly intact. No obvious focalities. Speech is fluent. Coordination is intact. Psychiatric: Judgment and insight are intact. Affect is appropriate. Breasts: Left breast - post biopsy ecchymoses in LOQ of left breast. No palpable lesions in either breast, nor any axillary masses   LABORATORY DATA:  Lab Results  Component Value Date   WBC 4.6 12/20/2012   HGB 12.9 12/20/2012   HCT 39.0 12/20/2012   MCV 83.0 12/20/2012   PLT 191 12/20/2012   CMP     Component Value Date/Time   NA 137 04/12/2012 0600   K 4.5 04/12/2012 0600   CL 102 04/12/2012 0600   CO2 28 04/12/2012 0600   GLUCOSE 192* 04/12/2012 0600   BUN 20 04/12/2012 0600   CREATININE 0.93 11/29/2012 0815   CALCIUM 8.7 04/12/2012 0600   PROT 7.2 03/31/2012 1428   ALBUMIN 3.7 03/31/2012 1428   AST 16 03/31/2012 1428   ALT 14 03/31/2012 1428   ALKPHOS 76 03/31/2012 1428   BILITOT 0.5 03/31/2012 1428   GFRNONAA 63* 11/29/2012 0815   GFRAA 73* 11/29/2012 0815         RADIOGRAPHY: Mr Tibia Fibula Right W Wo Contrast  11/29/2012   *RADIOLOGY REPORT*  Clinical Data:  Recurrent right lower leg pain.  Right knee replacement.  MRI OF THE RIGHT LOWER LEG WITH AND WITHOUT CONTRAST  Technique:  Multiplanar, multisequence MR imaging of  the right lower leg was performed before and after the administration of intravenous contrast.  Contrast: 20mL MULTIHANCE GADOBENATE DIMEGLUMINE 529 MG/ML IV SOLN  Comparison:   None.  Findings: The tibia and fibula appear normal with no evidence of stress fracture or other acute abnormality.  The tibial component of the total knee prosthesis obscures detail of the proximal tibia.  The muscle structures of the right lower leg appear normal.  There is circumferential edema in the subcutaneous soft tissues superficial to the muscle fascia in the right lower leg.  This is bilaterally symmetrical with the left lower leg.  No appreciable ankle or knee joint effusions.  IMPRESSION: No  appreciable significant abnormalities of the right lower leg. Specifically, no evidence of stress fracture.   Original Report Authenticated By: Francene Boyers, M.D.   Mr Breast Bilateral W Wo Contrast  12/19/2012   *RADIOLOGY REPORT*  Clinical Data:Low grade ductal carcinoma in situ diagnosed by stereotactic core needle biopsy of calcifications in the 3 o'clock position of the left breast, approximately 11 cm deep to the nipple.  BILATERAL BREAST MRI WITH AND WITHOUT CONTRAST  Technique: Multiplanar, multisequence MR images of both breasts were obtained prior to and following the intravenous administration of 20ml of Multihance.  THREE-DIMENSIONAL MR IMAGE RENDERING ON INDEPENDENT WORKSTATION: Three-dimensional MR images were rendered by post-processing  of the original MR data on an independent DynaCad workstation. The three-dimensional MR images were interpreted, and findings are reported in the following complete MRI report for this study.  Comparison:  Post biopsy mammogram of the left breast [facility] dated 12/13/2012.  Outside mammograms from [facility] dated 11/28/2012 and 11/27/2012.  FINDINGS:  Breast composition:  b:  Scattered fibroglandular tissue  Background parenchymal  enhancement: Moderate.  Right breast:  No mass or abnormal enhancement.  Left breast:  No mass or abnormal enhancement.  The stereotactic biopsy site containing the biopsy clip is identified in the 3 o'clock position of the left breast, approximately 9 cm deep to the nipple.  Lymph nodes:  No abnormal appearing lymph nodes.  Ancillary findings:  None.  IMPRESSION: Slight biopsy change noted at the stereotactic biopsy site at 3 o'clock 9 cm deep to the left nipple.  No additional findings noted.  RECOMMENDATION: Treatment plan  BI-RADS CATEGORY 6:  Known biopsy-proven malignancy - appropriate action should be taken.   Original Report Authenticated By: Cain Saupe, M.D.   Mm Radiologist Eval And Mgmt  12/15/2012   *RADIOLOGY REPORT*  ESTABLISHED PATIENT OFFICE VISIT - LEVEL II [phone])  Chief Complaint:  Status post left breast stereotactic biopsy.  History:  Patient presents after core biopsy of the left breast for calcifications.  Exam:  Biopsy incision is clean and intact.  Small amount of ecchymosis is identified at the biopsy site.  Pathology: Ductal carcinoma in situ with calcifications.  Assessment and Plan:  The patient is scheduled for multidisciplinary clinic on 12/20/2012.  She had been scheduled for her breast MRI examination on 12/19/2012.   Original Report Authenticated By: Jerene Dilling, M.D.   Mm Lt Breast Bx W Loc Dev 1st Lesion Image Bx Spec Stereo Guide  12/13/2012   *RADIOLOGY REPORT*  Clinical Data:  Left breast calcifications  STEREOTACTIC-GUIDED VACUUM ASSISTED BIOPSY OF THE LEFT BREAST AND SPECIMEN RADIOGRAPH  Comparison: Previous exams.  I met with the patient and we discussed the procedure of stereotactic-guided biopsy, including benefits and alternatives. We discussed the high likelihood of a successful procedure. We discussed the risks of the procedure, including infection, bleeding, tissue injury, clip migration, and inadequate sampling. Informed, written consent was given.  The usual time-out protocol was performed immediately prior to the procedure.  Using sterile technique and 2% Lidocaine as local anesthetic, under stereotactic guidance, a 9 gauge vacuum-assisted device was used to perform core needle biopsy of calcifications in the lateral aspect of the left breast using an inferior approach.  Specimen radiograph was performed, showing calcifications are present the tissue samples.  Specimens with calcifications are identified for pathology.  At the conclusion of the procedure, a tophat shaped tissue marker clip was deployed into the biopsy cavity.  Follow-up 2-view mammogram confirmed  clip place.  IMPRESSION: Stereotactic-guided biopsy of the left breast.  No apparent complications.   Original Report Authenticated By: Baird Lyons, M.D.      IMPRESSION/PLAN: It was a pleasure meeting the patient today. She has been discussed at our multidisciplinary tumor board.  The consensus is that she be a good candidate for breast conservation. She is enthusiastic about breast conservation as opposed to mastectomy.  We discussed the risks, benefits, and side effects of radiotherapy. We discussed that radiation would take approximately 6-7 weeks to complete and that I would give the patient a few weeks to heal following surgery before starting treatment planning. We spoke about acute effects including skin irritation and fatigue as well as much less common late effects including lung and heart irritation. RT would decrease risk of local recurrence by approximately 50%. We spoke about the latest technology that is used to minimize the risk of late effects for breast cancer patients undergoing radiotherapy. No guarantees of treatment were given. The patient is enthusiastic about proceeding with treatment. I look forward to participating in the patient's care.  If I am on maternity leave by the time she is ready for RT planning, one of my partners can see her.  She is comfortable with this.      I spent 20 minutes  face to face with the patient and more than 50% of that time was spent in counseling and/or coordination of care.    __________________________________________   Lonie Peak, MD

## 2012-12-20 NOTE — Telephone Encounter (Signed)
, °

## 2012-12-20 NOTE — Progress Notes (Signed)
Re:   [name] DOB:   06-Jan-1946 MRN:   [id number]  [facility]  ASSESSMENT AND PLAN: 1.  Left breast cancer (Tis, N0)  DCIS,  ER - 100%, PR - 30%  I discussed the options for breast cancer treatment with the patient.  The patient is in the multidisciplinary clinic and understands that  treatment of breast cancer includes medical oncology and radiation oncology.  I discussed the surgical options of lumpectomy vs. mastectomy.  If mastectomy, there is the possibility of reconstruction.   I discussed the options of lymph node biopsy.  The treatment plan depends on the pathologic staging of the tumor and the patient's personal wishes.  The risks of surgery include, but are not limited to, bleeding, infection, the need for further surgery, and nerve injury.  The patient has been given literature on the treatment of breast cancer.  The patient is a good candidate for lumpectomy.  Plan:  1) Lumpectomy alone (does not need lymph node biopsy), 2) radiation therapy  2.  Hypertension 3.  Diabetes Mellitus 4.  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  Morbid obesity 6.  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  Right knee replacement for which she is still having pain and lymphedema.   No chief complaint on file.  REFERRING PHYSICIAN: MCNEILL,WENDY, MD  HISTORY OF PRESENT ILLNESS: [name] is a 67 y.o. (DOB: [birth year])   female whose primary care physician is MCNEILL,WENDY, MD and comes to [facility] for a left breast cancer.  She went for routine mammography in [place].  they saw something abnormal in her left breast and wanted to send her to [facility] or [facility].  But the patient talked to her PCP, Dr. Uvaldo Rising, who encouraged her to have her care in [place].  She had a [facility] on 12[facility]13/2012 which showed low grade DCIS (Accession: [id number]). She had an MRI on 12/19/2012 which showed slight biopsy change in the  left breast at 3 o'clock. She has an aunt on her father's side who had breast cancer. She is not on hormone meds.  She had a hysterectomy by Dr. Perlie Gold in 1981.   Past Medical History  Diagnosis Date  . Right knee DJD   . Diabetes   . History of pulmonary embolism   . Hypertension   . High cholesterol   . Pulmonary embolism   . Pneumonia   . Bell's palsy   . Sleep apnea   . PONV (postoperative nausea and vomiting)   . 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 Subcutaneous hematoma left leg just outside knee joint 04/10/2012    Culture was done of this fluid collection is pending  Gram stain had white cells but no organism      Past Surgical History  Procedure Laterality Date  . Knee arthroplasty  2007    left  . Abdominal hysterectomy  1981  . Thyroidectomy  2010  . Total knee arthroplasty  04/10/2012    Procedure: TOTAL KNEE ARTHROPLASTY;  Surgeon: Nilda Simmer, MD;  Location: [facility] OR;  Service: Orthopedics;  Laterality: Right;      Current Outpatient Prescriptions  Medication Sig Dispense Refill  . ALPRAZolam (XANAX) 0.25 MG tablet Take 1 tablet (0.25 mg total) by mouth 3 (three) times daily as needed for anxiety.  60 tablet  0  . aspirin EC 81 MG tablet Take 81 mg by mouth daily.      Marland Kitchen  bisacodyl (DULCOLAX) 5 MG EC tablet Take 2 tablets every night with dinner until bowel movement.  LAXITIVE.  Restart if two days since last bowel movement  30 tablet  0  . celecoxib (CELEBREX) 200 MG capsule Take 1 capsule (200 mg total) by mouth daily. For pain  30 capsule  0  . docusate sodium 100 MG CAPS 1 tab 2 times a day while on narcotics.  STOOL SOFTENER  60 capsule  0  . levothyroxine (SYNTHROID, LEVOTHROID) 200 MCG tablet Take 200 mcg by mouth daily.      Marland Kitchen lisinopril (PRINIVIL,ZESTRIL) 10 MG tablet Take 10 mg by mouth daily.      . metFORMIN (GLUCOPHAGE) 1000 MG tablet Take 500-1,000 mg by mouth 2 (two) times daily with a meal. 500mg  in the morning and 1000mg  at night      . mupirocin ointment  (BACTROBAN) 2 % Use BID intranasally for 14 days.  Stop date is 04/24/2012  22 g  0  . ondansetron (ZOFRAN) 4 MG tablet Take 1 tablet (4 mg total) by mouth every 6 (six) hours as needed for nausea.  30 tablet  0  . oxyCODONE (OXY IR/ROXICODONE) 5 MG immediate release tablet Take 1-2 tablets (5-10 mg total) by mouth every 3 (three) hours as needed for pain.  100 tablet  0  . Rivaroxaban (XARELTO) 10 MG TABS tablet Take 1 tablet (10 mg total) by mouth daily. Increase to 20mg  daily on 04/24/2012  30 tablet  0  . simvastatin (ZOCOR) 40 MG tablet Take 40 mg by mouth daily.      Marland Kitchen zolpidem (AMBIEN) 10 MG tablet Take 1 tablet (10 mg total) by mouth at bedtime as needed for sleep. For sleep  30 tablet  0   No current facility-administered medications for this visit.     No Known Allergies  REVIEW OF SYSTEMS: Skin:  No history of rash.  No history of abnormal moles. Infection:  No history of hepatitis or HIV.  No history of MRSA. Neurologic:  Bell's palsy at 67 yo.  This has resolved.  She is taking the oxycodone just about daily because of pain in her right leg/knee. Cardiac:  Hypertension x 5 years. No history of heart disease.  No history of prior cardiac catheterization.  Pulmonary:  History of PE x 9 in 2007 in conjunction with her left knee replacement.   No OSA/CPAP.  Endocrine:  Diabetes mellitus x 15 years.  She had a thyroidectomy by Dr. 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 Gastrointestinal:  No history of stomach disease.  No history of liver disease.  No history of gall bladder disease.  No history of pancreas disease.  Colonoscopy by Dr. Dulce Sellar around 2012. Urologic:  No history of kidney stones.  No history of bladder infections. Musculoskeletal:  Left knee replacement 2007 complicated with 9 PE's.  Saw Dr. Delford Field at the time.  Not on anticoagulation now.  Right knee replacement - Jan 2014 - complicated with pain and edema of the right leg. Hematologic:  No  bleeding disorder.  No history of anemia.  Not anticoagulated. Psycho-social:  The patient is oriented.   The patient has no obvious psychologic or social impairment to understanding our conversation and plan.  SOCIAL and FAMILY HISTORY: Married. Husband is with her. She makes and sells pottery.  Though her husband wants to "retire". Has 4 children:  Ages 52, 62, 32, 20.  They travel with her to show her pottery.  PHYSICAL EXAM: There were no vitals taken for this visit.  General: WN obese F who is alert and generally healthy appearing.  HEENT: Normal. Pupils equal. Neck: Supple. No mass.  Thyroid scar well healed. Lymph Nodes:  No supraclavicular, cervical nodes, or axillary node. Lungs: Clear to auscultation and symmetric breath sounds. Heart:  RRR. No murmur or rub. Breasts:  Right:  Normal  Left:  Bruise at 4 o'clock.  No distinct mass.  No nipple discharge.  Abdomen: Soft. No mass. No tenderness. No hernia. Normal bowel sounds.  Pfannenstiel scar. Has diastasis recti. Rectal: Not done. Extremities:  3+ edema in right leg.  She had leg propped up during exam.  She is going to Dr. Jonetta Osgood office today for further tx.  Bilateral knee scars. Neurologic:  Grossly intact to motor and sensory function. Psychiatric: Has normal mood and affect. Behavior is normal.   DATA REVIEWED: Epic notes and path.  Ovidio Kin, MD,  [facility] [address]  [address]   [place], [place]    [address] Phone:  [phone] FAX:  [phone]

## 2012-12-20 NOTE — Progress Notes (Signed)
Checked in new patient with no financial issues. Mail and phone is still good, but gave info for mychart-- email address is correct. She has [facility] form

## 2012-12-20 NOTE — Progress Notes (Signed)
Patient ID: [name], female   DOB: Aug 04, 1945, 67 y.o.   MRN: [id number] ID: [name] OB: 07/22/1945  MR#: [id number]  CSN#:[id number]  PCP: Jacqueline Dimitri, MD GYN:   SU: Ovidio Kin OTHER MD:  CHIEF COMPLAINT: New diagnosis of breast cancer  HISTORY OF PRESENT ILLNESS: [name] had routine screening mammography at the [facility] in [place] 11/27/2012 showing new calcifications in the left breast upper outer quadrant. Additional views 11/28/2012 showed a 1.4 cm cluster of pleomorphic calcifications, for which biopsy was recommended. 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  Bilateral breast MRI 12/19/2012 showed only post biopsy change.  The patient's subsequent history is as detailed below  INTERVAL HISTORY: [name] was seen in the multidisciplinary breast cancer clinic 12/20/2012 accompanied by her husband [name]  REVIEW OF SYSTEMS: There were no particular symptoms leading to this diagnosis, which was routine. She did however recently have surgery to her right knee, and this is painful. She describes the pain as stabbing and intermittent. It is well controlled on her current medications which are not causing her any constipation. She has problems with her dentures. Sometimes her ankles swell. It is difficult for her to walk at present. Generally in addition to any problems she has other joint pains here in there which are not more intense or persistent than prior. Just history of diabetes and thyroid issues. Otherwise a detailed review of systems today was noncontributory, as separately scanned  PAST MEDICAL HISTORY: Past Medical History  Diagnosis Date  . Right knee DJD   . Diabetes   . History of pulmonary embolism   . Hypertension   . High cholesterol   . Pulmonary embolism   . Pneumonia   . Bell's  palsy   . Sleep apnea   . PONV (postoperative nausea and vomiting)   . 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 Subcutaneous hematoma left leg just outside knee joint 04/10/2012    Culture was done of this fluid collection is pending  Gram stain had white cells but no organism  . Breast cancer   . Thyroid disease     PAST SURGICAL HISTORY: Past Surgical History  Procedure Laterality Date  . Knee arthroplasty  2007    left  . Abdominal hysterectomy  1981    with unilateral slapingo-oophorectomy  . Thyroidectomy  2010  . Total knee arthroplasty  04/10/2012    Procedure: TOTAL KNEE ARTHROPLASTY;  Surgeon: Nilda Simmer, MD;  Location: [facility] OR;  Service: Orthopedics;  Laterality: Right;    FAMILY HISTORY Family History  Problem Relation Age of Onset  . Hypertension Mother   . Heart attack Mother   . Heart disease Mother     before age 25  . Other Mother     varicose veins  . Cancer Father     kidney  . Diabetes Father   . Heart disease Father   . Hyperlipidemia Father   . Hypertension Father   . Diabetes Son   . Hyperlipidemia Son   . Hypertension Son   . Other Son     varicose veins   the patient's father died from a heart attack at age 25. The patient's mother died in an automobile accident at age 54. The patient had one brother and one sister. [name] did have renal cell carcinoma diagnosed at age 42, cured with surgery. There is  no history of breast or ovarian cancer in the family to the patient's knowledge.  GYNECOLOGIC HISTORY:  Menarche age 32, first live birth age 58, the patient is GX P4. She underwent hysterectomy with unilateral salpingo-oophorectomy in 1981. She did not take hormone replacement.  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 Her husband [name] (goes by [name]") was a Land. Both [name] and [name] are now [facility] [facility]. They exhibited locally. Their son [name] lives in [place] and works in Production designer, theatre/television/film for the city of  [place]. Son [name] lives in [place] and works for [employer] long care. Son [name] lives in [place] as a Production designer, theatre/television/film for [employer]. Son [name] also lives in [place] or and is a Patent attorney. The patient has 11 grandchildren. She attends a [employer] in [place].    ADVANCED DIRECTIVES: In place   Montgomery MAINTENANCE: History  Substance Use Topics  . Smoking status: Never Smoker   . Smokeless tobacco: Never Used  . Alcohol Use: No     Colonoscopy: 2012/ Outlaw  PAP: 2009  Bone density:  Lipid panel:  No Known Allergies  Current Outpatient Prescriptions  Medication Sig Dispense Refill  . celecoxib (CELEBREX) 200 MG capsule Take 1 capsule (200 mg total) by mouth daily. For pain  30 capsule  0  . Cholecalciferol (VITAMIN D-3) 5000 UNITS TABS Take 5,000 Units by mouth daily.      . diclofenac sodium (VOLTAREN) 1 % GEL Apply topically as needed.      Marland Kitchen levothyroxine (SYNTHROID, LEVOTHROID) 200 MCG tablet Take 200 mcg by mouth daily.      . metFORMIN (GLUCOPHAGE) 1000 MG tablet Take 500-1,000 mg by mouth 2 (two) times daily with a meal. 500mg  in the morning and 1000mg  at night      . oxyCODONE (OXY IR/ROXICODONE) 5 MG immediate release tablet Take 1-2 tablets (5-10 mg total) by mouth every 3 (three) hours as needed for pain.  100 tablet  0  . simvastatin (ZOCOR) 40 MG tablet Take 40 mg by mouth daily.      Marland Kitchen zolpidem (AMBIEN) 10 MG tablet Take 1 tablet (10 mg total) by mouth at bedtime as needed for sleep. For sleep  30 tablet  0  . aspirin EC 81 MG tablet Take 81 mg by mouth daily.      Marland Kitchen lisinopril (PRINIVIL,ZESTRIL) 10 MG tablet Take 10 mg by mouth daily.       No current facility-administered medications for this visit.    OBJECTIVE: Middle-aged white woman sitting with her right leg outstretched Filed Vitals:   12/20/12 0856  BP: 152/97  Pulse: 93  Temp: 97.9 F (36.6 C)  Resp: 20     Body mass index is 45.09 kg/(m^2).    ECOG FS:2 - Symptomatic, <50% confined to  bed  Ocular: Sclerae unicteric, pupils equal, round and reactive to light Ear-nose-throat: Oropharynx clear, dentition fair Lymphatic: No cervical or supraclavicular adenopathy Lungs no rales or rhonchi, good excursion bilaterally Heart regular rate and rhythm, no murmur appreciated Abd soft, nontender, positive bowel sounds MSK no focal spinal tenderness, no joint edema Neuro: non-focal, well-oriented, appropriate affect Breasts: The right breast is unremarkable; the left breast is status post recent biopsy. I do not palpate a mass in the breast or left axilla. There are no skin or nipple changes of concern   LAB RESULTS:  CMP     Component Value Date/Time   NA 142 12/20/2012 0845   NA 137  04/12/2012 0600   K 4.2 12/20/2012 0845   K 4.5 04/12/2012 0600   CL 102 04/12/2012 0600   CO2 31* 12/20/2012 0845   CO2 28 04/12/2012 0600   GLUCOSE 137 12/20/2012 0845   GLUCOSE 192* 04/12/2012 0600   BUN 22.2 12/20/2012 0845   BUN 20 04/12/2012 0600   CREATININE 0.9 12/20/2012 0845   CREATININE 0.93 11/29/2012 0815   CALCIUM 9.3 12/20/2012 0845   CALCIUM 8.7 04/12/2012 0600   PROT 6.9 12/20/2012 0845   PROT 7.2 03/31/2012 1428   ALBUMIN 3.6 12/20/2012 0845   ALBUMIN 3.7 03/31/2012 1428   AST 13 12/20/2012 0845   AST 16 03/31/2012 1428   ALT 8 12/20/2012 0845   ALT 14 03/31/2012 1428   ALKPHOS 69 12/20/2012 0845   ALKPHOS 76 03/31/2012 1428   BILITOT 1.08 12/20/2012 0845   BILITOT 0.5 03/31/2012 1428   GFRNONAA 63* 11/29/2012 0815   GFRAA 73* 11/29/2012 0815    I No results found for this basename: SPEP,  UPEP,   kappa and lambda light chains    Lab Results  Component Value Date   WBC 4.6 12/20/2012   NEUTROABS 2.8 12/20/2012   HGB 12.9 12/20/2012   HCT 39.0 12/20/2012   MCV 83.0 12/20/2012   PLT 191 12/20/2012      Chemistry      Component Value Date/Time   NA 142 12/20/2012 0845   NA 137 04/12/2012 0600   K 4.2 12/20/2012 0845   K 4.5 04/12/2012 0600   CL 102 04/12/2012 0600   CO2 31* 12/20/2012 0845    CO2 28 04/12/2012 0600   BUN 22.2 12/20/2012 0845   BUN 20 04/12/2012 0600   CREATININE 0.9 12/20/2012 0845   CREATININE 0.93 11/29/2012 0815      Component Value Date/Time   CALCIUM 9.3 12/20/2012 0845   CALCIUM 8.7 04/12/2012 0600   ALKPHOS 69 12/20/2012 0845   ALKPHOS 76 03/31/2012 1428   AST 13 12/20/2012 0845   AST 16 03/31/2012 1428   ALT 8 12/20/2012 0845   ALT 14 03/31/2012 1428   BILITOT 1.08 12/20/2012 0845   BILITOT 0.5 03/31/2012 1428       No results found for this basename: LABCA2    No components found with this basename: LABCA125    No results found for this basename: INR,  in the last 168 hours  Urinalysis    Component Value Date/Time   COLORURINE YELLOW 04/10/2012 1136   APPEARANCEUR CLEAR 04/10/2012 1136   LABSPEC 1.025 04/10/2012 1136   PHURINE 6.0 04/10/2012 1136   GLUCOSEU NEGATIVE 04/10/2012 1136   HGBUR NEGATIVE 04/10/2012 1136   BILIRUBINUR NEGATIVE 04/10/2012 1136   KETONESUR NEGATIVE 04/10/2012 1136   PROTEINUR NEGATIVE 04/10/2012 1136   UROBILINOGEN 2.0* 04/10/2012 1136   NITRITE NEGATIVE 04/10/2012 1136   LEUKOCYTESUR NEGATIVE 04/10/2012 1136    STUDIES: Mr Tibia Fibula Right W Wo Contrast  11/29/2012   *RADIOLOGY REPORT*  Clinical Data:  Recurrent right lower leg pain.  Right knee replacement.  MRI OF THE RIGHT LOWER LEG WITH AND WITHOUT CONTRAST  Technique:  Multiplanar, multisequence MR imaging of the right lower leg was performed before and after the administration of intravenous contrast.  Contrast: 20mL MULTIHANCE GADOBENATE DIMEGLUMINE 529 MG/ML IV SOLN  Comparison:   None.  Findings: The tibia and fibula appear normal with no evidence of stress fracture or other acute abnormality.  The tibial component of the total knee prosthesis obscures detail of the  proximal tibia.  The muscle structures of the right lower leg appear normal.  There is circumferential edema in the subcutaneous soft tissues superficial to the muscle fascia in the right lower leg.  This is  bilaterally symmetrical with the left lower leg.  No appreciable ankle or knee joint effusions.  IMPRESSION: No appreciable significant abnormalities of the right lower leg. Specifically, no evidence of stress fracture.   Original Report Authenticated By: Francene Boyers, M.D.   Mr Breast Bilateral W Wo Contrast  12/19/2012   *RADIOLOGY REPORT*  Clinical Data:Low grade ductal carcinoma in situ diagnosed by stereotactic core needle biopsy of calcifications in the 3 o'clock position of the left breast, approximately 11 cm deep to the nipple.  BILATERAL BREAST MRI WITH AND WITHOUT CONTRAST  Technique: Multiplanar, multisequence MR images of both breasts were obtained prior to and following the intravenous administration of 20ml of Multihance.  THREE-DIMENSIONAL MR IMAGE RENDERING ON INDEPENDENT WORKSTATION: Three-dimensional MR images were rendered by post-processing  of the original MR data on an independent DynaCad workstation. The three-dimensional MR images were interpreted, and findings are reported in the following complete MRI report for this study.  Comparison:  Post biopsy mamm[facility] of the left breast [facility] dated 12/13/2012.  Outside mammograms from [facility] dated 11/28/2012 and 11/27/2012.  FINDINGS:  Breast composition:  b:  Scattered fibroglandular tissue  Background parenchymal enhancement: Moderate.  Right breast:  No mass or abnormal enhancement.  Left breast:  No mass or abnormal enhancement.  The stereotactic biopsy site containing the biopsy clip is identified in the 3 o'clock position of the left breast, approximately 9 cm deep to the nipple.  Lymph nodes:  No abnormal appearing lymph nodes.  Ancillary findings:  None.  IMPRESSION: Slight biopsy change noted at the stereotactic biopsy site at 3 o'clock 9 cm deep to the left nipple.  No additional findings noted.  RECOMMENDATION: Treatment plan  BI-RADS CATEGORY 6:  Known biopsy-proven malignancy -  appropriate action should be taken.   Original Report Authenticated By: Cain Saupe, M.D.   Mm Radiologist Eval And Mgmt  12/15/2012   *RADIOLOGY REPORT*  ESTABLISHED PATIENT OFFICE VISIT - LEVEL II [phone])  Chief Complaint:  Status post left breast stereotactic biopsy.  History:  Patient presents after core biopsy of the left breast for calcifications.  Exam:  Biopsy incision is clean and intact.  Small amount of ecchymosis is identified at the biopsy site.  Pathology: Ductal carcinoma in situ with calcifications.  Assessment and Plan:  The patient is scheduled for multidisciplinary clinic on 12/20/2012.  She had been scheduled for her breast MRI examination on 12/19/2012.   Original Report Authenticated By: Jerene Dilling, M.D.   Mm Lt Breast Bx W Loc Dev 1st Lesion Image Bx Spec Stereo Guide  12/13/2012   *RADIOLOGY REPORT*  Clinical Data:  Left breast calcifications  STEREOTACTIC-GUIDED VACUUM ASSISTED BIOPSY OF THE LEFT BREAST AND SPECIMEN RADIOGRAPH  Comparison: Previous exams.  I met with the patient and we discussed the procedure of stereotactic-guided biopsy, including benefits and alternatives. We discussed the high likelihood of a successful procedure. We discussed the risks of the procedure, including infection, bleeding, tissue injury, clip migration, and inadequate sampling. Informed, written consent was given. The usual time-out protocol was performed immediately prior to the procedure.  Using sterile technique and 2% Lidocaine as local anesthetic, under stereotactic guidance, a 9 gauge vacuum-assisted device was used to perform core needle biopsy of calcifications in the  lateral aspect of the left breast using an inferior approach.  Specimen radiograph was performed, showing calcifications are present the tissue samples.  Specimens with calcifications are identified for pathology.  At the conclusion of the procedure, a tophat shaped tissue marker clip was deployed into the biopsy  cavity.  Follow-up 2-view mammogram confirmed clip place.  IMPRESSION: Stereotactic-guided biopsy of the left breast.  No apparent complications.   Original Report Authenticated By: Baird Lyons, M.D.    ASSESSMENT: 67 y.o. [name], [name] woman status post left breast biopsy 12/13/2012 for a ductal carcinoma in situ, grade 1, estrogen receptor 100% positive, progesterone receptor 30% positive  PLAN: We spent the better part of today's hour-long appointment discussing the biology of breast cancer in general, and the specifics of the patient's tumor in particular. Rayanna understands that noninvasive breast cancer is in itself not life threatening, and therefore she is not putting yourself at risk by choosing breast conservation surgery. This does mean there would be some risk of local recurrence, and she can reduce that risk with radiation and with antiestrogen pills.  In general radiation reduces the risk of local recurrence by more than half, an anti-estrogens further reduce the risk by a half. In her case, with a history of pulmonary emboli, tamoxifen would not be an option, and therefore we would be looking at aromatase inhibitors. This means we will want to have a bone density before next visit here, and that is being operationalized.  Tiny has a good understanding of this plan. She is very much in agreement with that. She understands the intent of treatment is cure. She will see me again in 3 months, and at that time we will discuss whether she wants to start an anastrozole or be followed with no systemic treatment.   Lowella Dell, MD   12/20/2012 10:46 AM

## 2012-12-22 ENCOUNTER — Encounter (HOSPITAL_BASED_OUTPATIENT_CLINIC_OR_DEPARTMENT_OTHER): Payer: Self-pay | Admitting: *Deleted

## 2012-12-22 NOTE — Progress Notes (Signed)
Reviewed hx-does not need to come in for new labs-done 12/20/12-

## 2012-12-25 MED ORDER — OXYMETAZOLINE HCL 0.05 % NA SOLN
NASAL | Status: AC
  Filled 2012-12-25: qty 15

## 2012-12-26 ENCOUNTER — Encounter: Payer: Self-pay | Admitting: *Deleted

## 2012-12-26 ENCOUNTER — Encounter (HOSPITAL_BASED_OUTPATIENT_CLINIC_OR_DEPARTMENT_OTHER): Payer: Self-pay | Admitting: *Deleted

## 2012-12-26 NOTE — Progress Notes (Signed)
[facility] Psychosocial Distress Screening Clinical Social Work  Patient completed distress screening protocol, and scored an 8 on the Psychosocial Distress Thermometer which indicates severe distress. Clinical Social Worker met with pt in [facility] to assess for distress and other psychosocial needs.  Pt stated her distress level was much lower after speaking with the physicians and getting more information on her treatment plan.  CSW informed pt of the support team and support services at [facility], and encouraged pt to call with any questions or concerns.    Tamala Julian, MSW, LCSW Clinical Social Worker [facility] [phone]

## 2012-12-26 NOTE — Progress Notes (Signed)
Reviewed preop instructions with pt She has been dx lymphedema legs-wearing wraps-dopplers were negative She has been off her asa for surgery-but will start back post op Would need PAS hose and TEDS Also still got nauseated last surgery-may need scop patch

## 2012-12-28 ENCOUNTER — Ambulatory Visit (HOSPITAL_BASED_OUTPATIENT_CLINIC_OR_DEPARTMENT_OTHER): Payer: Medicare Other | Admitting: Anesthesiology

## 2012-12-28 ENCOUNTER — Encounter (HOSPITAL_BASED_OUTPATIENT_CLINIC_OR_DEPARTMENT_OTHER): Payer: Self-pay | Admitting: Anesthesiology

## 2012-12-28 ENCOUNTER — Ambulatory Visit
Admission: RE | Admit: 2012-12-28 | Discharge: 2012-12-28 | Disposition: A | Discharge status: Home / Self Care | Payer: Medicare Other | Source: Ambulatory Visit | Attending: Surgery | Admitting: Surgery

## 2012-12-28 ENCOUNTER — Ambulatory Visit (HOSPITAL_BASED_OUTPATIENT_CLINIC_OR_DEPARTMENT_OTHER)
Admission: RE | Admit: 2012-12-28 | Discharge: 2012-12-28 | Disposition: A | Discharge status: Home / Self Care | Payer: Medicare Other | Source: Ambulatory Visit | Attending: Surgery | Admitting: Surgery

## 2012-12-28 ENCOUNTER — Encounter (HOSPITAL_BASED_OUTPATIENT_CLINIC_OR_DEPARTMENT_OTHER)
Admission: RE | Disposition: A | Discharge status: Home / Self Care | Payer: Self-pay | Source: Ambulatory Visit | Attending: Surgery

## 2012-12-28 ENCOUNTER — Encounter (HOSPITAL_BASED_OUTPATIENT_CLINIC_OR_DEPARTMENT_OTHER): Payer: Self-pay | Admitting: *Deleted

## 2012-12-28 DIAGNOSIS — D0590 Unspecified type of carcinoma in situ of unspecified breast: Secondary | ICD-10-CM | POA: Insufficient documentation

## 2012-12-28 DIAGNOSIS — C50912 Malignant neoplasm of unspecified site of left female breast: Secondary | ICD-10-CM

## 2012-12-28 DIAGNOSIS — Z96659 Presence of unspecified artificial knee joint: Secondary | ICD-10-CM | POA: Insufficient documentation

## 2012-12-28 DIAGNOSIS — E119 Type 2 diabetes mellitus without complications: Secondary | ICD-10-CM | POA: Insufficient documentation

## 2012-12-28 DIAGNOSIS — C50412 Malignant neoplasm of upper-outer quadrant of left female breast: Secondary | ICD-10-CM

## 2012-12-28 DIAGNOSIS — I10 Essential (primary) hypertension: Secondary | ICD-10-CM | POA: Insufficient documentation

## 2012-12-28 HISTORY — DX: Presence of spectacles and contact lenses: Z97.3

## 2012-12-28 HISTORY — PX: BREAST LUMPECTOMY WITH NEEDLE LOCALIZATION: SHX5759

## 2012-12-28 HISTORY — DX: Presence of dental prosthetic device (complete) (partial): Z97.2

## 2012-12-28 HISTORY — DX: Localized edema: R60.0

## 2012-12-28 LAB — POCT HEMOGLOBIN-HEMACUE: Hemoglobin: 13.4 g/dL (ref 12.0–15.0)

## 2012-12-28 LAB — GLUCOSE, CAPILLARY: Glucose-Capillary: 126 mg/dL — ABNORMAL HIGH (ref 70–99)

## 2012-12-28 SURGERY — BREAST LUMPECTOMY WITH NEEDLE LOCALIZATION
Anesthesia: General | Site: Breast | Laterality: Left | Wound class: Clean

## 2012-12-28 MED ORDER — FENTANYL CITRATE 0.05 MG/ML IJ SOLN
INTRAMUSCULAR | Status: DC | PRN
  Administered 2012-12-28: 100 ug via INTRAVENOUS

## 2012-12-28 MED ORDER — ONDANSETRON HCL 4 MG/2ML IJ SOLN: 4.0000 mg | Freq: Once | INTRAMUSCULAR | Status: DC | PRN

## 2012-12-28 MED ORDER — DEXAMETHASONE SODIUM PHOSPHATE 4 MG/ML IJ SOLN
INTRAMUSCULAR | Status: DC | PRN
  Administered 2012-12-28: 10 mg via INTRAVENOUS

## 2012-12-28 MED ORDER — CHLORHEXIDINE GLUCONATE 4 % EX LIQD: 1.0000 "application " | Freq: Once | CUTANEOUS | Status: DC

## 2012-12-28 MED ORDER — LACTATED RINGERS IV SOLN
INTRAVENOUS | Status: DC
  Administered 2012-12-28 (×2): via INTRAVENOUS

## 2012-12-28 MED ORDER — HYDROCODONE-ACETAMINOPHEN 5-325 MG PO TABS: 1.0000 | ORAL_TABLET | Freq: Four times a day (QID) | ORAL | Status: DC | PRN

## 2012-12-28 MED ORDER — HYDROMORPHONE HCL PF 1 MG/ML IJ SOLN
0.2500 mg | INTRAMUSCULAR | Status: DC | PRN
  Administered 2012-12-28 (×3): 0.5 mg via INTRAVENOUS

## 2012-12-28 MED ORDER — CEFAZOLIN SODIUM-DEXTROSE 2-3 GM-% IV SOLR
2.0000 g | INTRAVENOUS | Status: AC
  Administered 2012-12-28: 2 g via INTRAVENOUS

## 2012-12-28 MED ORDER — ONDANSETRON HCL 4 MG/2ML IJ SOLN
INTRAMUSCULAR | Status: DC | PRN
  Administered 2012-12-28: 4 mg via INTRAVENOUS

## 2012-12-28 MED ORDER — PROPOFOL 10 MG/ML IV BOLUS
INTRAVENOUS | Status: DC | PRN
  Administered 2012-12-28: 200 mg via INTRAVENOUS
  Administered 2012-12-28: 20 mg via INTRAVENOUS

## 2012-12-28 MED ORDER — FENTANYL CITRATE 0.05 MG/ML IJ SOLN: 50.0000 ug | INTRAMUSCULAR | Status: DC | PRN

## 2012-12-28 MED ORDER — OXYCODONE HCL 5 MG/5ML PO SOLN: 5.0000 mg | Freq: Once | ORAL | Status: AC | PRN

## 2012-12-28 MED ORDER — MIDAZOLAM HCL 2 MG/2ML IJ SOLN
1.0000 mg | INTRAMUSCULAR | Status: DC | PRN
Start: 2012-12-28 — End: 2012-12-28

## 2012-12-28 MED ORDER — BUPIVACAINE HCL (PF) 0.25 % IJ SOLN
INTRAMUSCULAR | Status: DC | PRN
  Administered 2012-12-28: 30 mL

## 2012-12-28 MED ORDER — MIDAZOLAM HCL 5 MG/5ML IJ SOLN
INTRAMUSCULAR | Status: DC | PRN
  Administered 2012-12-28: 2 mg via INTRAVENOUS

## 2012-12-28 MED ORDER — LIDOCAINE HCL (CARDIAC) 20 MG/ML IV SOLN
INTRAVENOUS | Status: DC | PRN
  Administered 2012-12-28: 80 mg via INTRAVENOUS

## 2012-12-28 MED ORDER — EPHEDRINE SULFATE 50 MG/ML IJ SOLN
INTRAMUSCULAR | Status: DC | PRN
  Administered 2012-12-28 (×2): 10 mg via INTRAVENOUS

## 2012-12-28 MED ORDER — OXYCODONE HCL 5 MG PO TABS
5.0000 mg | ORAL_TABLET | Freq: Once | ORAL | Status: AC | PRN
  Administered 2012-12-28: 5 mg via ORAL

## 2012-12-28 SURGICAL SUPPLY — 51 items
ADH SKN CLS APL DERMABOND .7 (GAUZE/BANDAGES/DRESSINGS) ×1
APL SKNCLS STERI-STRIP NONHPOA (GAUZE/BANDAGES/DRESSINGS)
BANDAGE ELASTIC 6 VELCRO ST LF (GAUZE/BANDAGES/DRESSINGS) IMPLANT
BENZOIN TINCTURE PRP APPL 2/3 (GAUZE/BANDAGES/DRESSINGS) IMPLANT
BINDER BREAST LRG (GAUZE/BANDAGES/DRESSINGS) IMPLANT
BINDER BREAST MEDIUM (GAUZE/BANDAGES/DRESSINGS) IMPLANT
BINDER BREAST XLRG (GAUZE/BANDAGES/DRESSINGS) IMPLANT
BINDER BREAST XXLRG (GAUZE/BANDAGES/DRESSINGS) ×1 IMPLANT
BLADE SURG 10 STRL SS (BLADE) ×2 IMPLANT
BLADE SURG 15 STRL LF DISP TIS (BLADE) IMPLANT
BLADE SURG 15 STRL SS (BLADE) ×2
CANISTER SUCTION 1200CC (MISCELLANEOUS) ×2 IMPLANT
CHLORAPREP W/TINT 26ML (MISCELLANEOUS) ×2 IMPLANT
CLIP TI WIDE RED SMALL 6 (CLIP) ×2 IMPLANT
CLOTH BEACON ORANGE TIMEOUT ST (SAFETY) ×2 IMPLANT
COVER MAYO STAND STRL (DRAPES) ×2 IMPLANT
COVER TABLE BACK 60X90 (DRAPES) ×2 IMPLANT
DECANTER SPIKE VIAL GLASS SM (MISCELLANEOUS) IMPLANT
DERMABOND ADVANCED (GAUZE/BANDAGES/DRESSINGS) ×1
DERMABOND ADVANCED .7 DNX12 (GAUZE/BANDAGES/DRESSINGS) IMPLANT
DEVICE DUBIN W/COMP PLATE 8390 (MISCELLANEOUS) ×2 IMPLANT
DRAPE PED LAPAROTOMY (DRAPES) ×2 IMPLANT
DRAPE UTILITY XL STRL (DRAPES) ×2 IMPLANT
ELECT COATED BLADE 2.86 ST (ELECTRODE) ×2 IMPLANT
ELECT REM PT RETURN 9FT ADLT (ELECTROSURGICAL) ×2
ELECTRODE REM PT RTRN 9FT ADLT (ELECTROSURGICAL) ×1 IMPLANT
GAUZE SPONGE 4X4 12PLY STRL LF (GAUZE/BANDAGES/DRESSINGS) IMPLANT
GAUZE SPONGE 4X4 16PLY XRAY LF (GAUZE/BANDAGES/DRESSINGS) IMPLANT
GLOVE BIOGEL PI IND STRL 7.0 (GLOVE) IMPLANT
GLOVE BIOGEL PI INDICATOR 7.0 (GLOVE) ×1
GLOVE ECLIPSE 6.5 STRL STRAW (GLOVE) ×1 IMPLANT
GLOVE EXAM NITRILE LRG STRL (GLOVE) ×1 IMPLANT
GLOVE SURG SIGNA 7.5 PF LTX (GLOVE) ×3 IMPLANT
GOWN PREVENTION PLUS XLARGE (GOWN DISPOSABLE) ×2 IMPLANT
KIT MARKER MARGIN INK (KITS) ×1 IMPLANT
NDL HYPO 25X1 1.5 SAFETY (NEEDLE) ×1 IMPLANT
NEEDLE HYPO 25X1 1.5 SAFETY (NEEDLE) ×2 IMPLANT
NS IRRIG 1000ML POUR BTL (IV SOLUTION) ×1 IMPLANT
PACK BASIN DAY SURGERY FS (CUSTOM PROCEDURE TRAY) ×2 IMPLANT
PENCIL BUTTON HOLSTER BLD 10FT (ELECTRODE) ×2 IMPLANT
SLEEVE SCD COMPRESS KNEE MED (MISCELLANEOUS) ×1 IMPLANT
SLEEVE SCD COMPRESS KNEE XLRG (MISCELLANEOUS) ×1 IMPLANT
SPONGE GAUZE 4X4 12PLY (GAUZE/BANDAGES/DRESSINGS) ×1 IMPLANT
SPONGE LAP 18X18 X RAY DECT (DISPOSABLE) ×2 IMPLANT
STRIP CLOSURE SKIN 1/4X4 (GAUZE/BANDAGES/DRESSINGS) IMPLANT
SUT MON AB 5-0 PS2 18 (SUTURE) ×2 IMPLANT
SUT VICRYL 3-0 CR8 SH (SUTURE) ×2 IMPLANT
SYR CONTROL 10ML LL (SYRINGE) ×2 IMPLANT
TOWEL OR NON WOVEN STRL DISP B (DISPOSABLE) ×2 IMPLANT
TUBE CONNECTING 20X1/4 (TUBING) ×2 IMPLANT
YANKAUER SUCT BULB TIP NO VENT (SUCTIONS) ×2 IMPLANT

## 2012-12-28 NOTE — H&P (View-Only) (Signed)
 Re:   [name] DOB:   03/29/1946 MRN:   [id number]  [facility]  ASSESSMENT AND PLAN: 1.  Left breast cancer (Tis, N0)  DCIS,  ER - 100%, PR - 30%  I discussed the options for breast cancer treatment with the patient.  The patient is in the multidisciplinary clinic and understands that  treatment of breast cancer includes medical oncology and radiation oncology.  I discussed the surgical options of lumpectomy vs. mastectomy.  If mastectomy, there is the possibility of reconstruction.   I discussed the options of lymph node biopsy.  The treatment plan depends on the pathologic staging of the tumor and the patient's personal wishes.  The risks of surgery include, but are not limited to, bleeding, infection, the need for further surgery, and nerve injury.  The patient has been given literature on the treatment of breast cancer.  The patient is a good candidate for lumpectomy.  Plan:  1) Lumpectomy alone (does not need lymph node biopsy), 2) radiation therapy  2.  Hypertension 3.  Diabetes Mellitus 4.  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  Morbid obesity 6.  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  Right knee replacement for which she is still having pain and lymphedema.   No chief complaint on file.  REFERRING PHYSICIAN: MCNEILL,WENDY, MD  HISTORY OF PRESENT ILLNESS: [name] is a 67 y.o. (DOB: 09/13/1945)   female whose primary care physician is MCNEILL,WENDY, MD and comes to [facility] for a left breast cancer.  She went for routine mammography in [place].  they saw something abnormal in her left breast and wanted to send her to [facility] or [facility].  But the patient talked to her PCP, Dr. McNeil, who encouraged her to have her care in [place].  She [facility]2012 which showed low grade DCIS (Accession: [id number]). She had an MRI on 12/19/2012 which showed slight biopsy change in the  left breast at 3 o'clock. She has an aunt on her father's side who had breast cancer. She is not on hormone meds.  She had a hysterectomy by Dr. Russell in 1981.   Past Medical History  Diagnosis Date  . Right knee DJD   . Diabetes   . History of pulmonary embolism   . Hypertension   . High cholesterol   . Pulmonary embolism   . Pneumonia   . Bell's palsy   . Sleep apnea   . PONV (postoperative nausea and vomiting)   . 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 Subcutaneous hematoma left leg just outside knee joint 04/10/2012    Culture was done of this fluid collection is pending  Gram stain had white cells but no organism      Past Surgical History  Procedure Laterality Date  . Knee arthroplasty  2007    left  . Abdominal hysterectomy  1981  . Thyroidectomy  2010  . Total knee arthroplasty  04/10/2012    Procedure: TOTAL KNEE ARTHROPLASTY;  Surgeon: Robert A Wainer, MD;  Location: [facility] OR;  Service: Orthopedics;  Laterality: Right;      Current Outpatient Prescriptions  Medication Sig Dispense Refill  . ALPRAZolam (XANAX) 0.25 MG tablet Take 1 tablet (0.25 mg total) by mouth 3 (three) times daily as needed for anxiety.  60 tablet  0  . aspirin EC 81 MG tablet Take 81 mg by mouth daily.      .   bisacodyl (DULCOLAX) 5 MG EC tablet Take 2 tablets every night with dinner until bowel movement.  LAXITIVE.  Restart if two days since last bowel movement  30 tablet  0  . celecoxib (CELEBREX) 200 MG capsule Take 1 capsule (200 mg total) by mouth daily. For pain  30 capsule  0  . docusate sodium 100 MG CAPS 1 tab 2 times a day while on narcotics.  STOOL SOFTENER  60 capsule  0  . levothyroxine (SYNTHROID, LEVOTHROID) 200 MCG tablet Take 200 mcg by mouth daily.      . lisinopril (PRINIVIL,ZESTRIL) 10 MG tablet Take 10 mg by mouth daily.      . metFORMIN (GLUCOPHAGE) 1000 MG tablet Take 500-1,000 mg by mouth 2 (two) times daily with a meal. 500mg in the morning and 1000mg at night      . mupirocin ointment  (BACTROBAN) 2 % Use BID intranasally for 14 days.  Stop date is 04/24/2012  22 g  0  . ondansetron (ZOFRAN) 4 MG tablet Take 1 tablet (4 mg total) by mouth every 6 (six) hours as needed for nausea.  30 tablet  0  . oxyCODONE (OXY IR/ROXICODONE) 5 MG immediate release tablet Take 1-2 tablets (5-10 mg total) by mouth every 3 (three) hours as needed for pain.  100 tablet  0  . Rivaroxaban (XARELTO) 10 MG TABS tablet Take 1 tablet (10 mg total) by mouth daily. Increase to 20mg daily on 04/24/2012  30 tablet  0  . simvastatin (ZOCOR) 40 MG tablet Take 40 mg by mouth daily.      . zolpidem (AMBIEN) 10 MG tablet Take 1 tablet (10 mg total) by mouth at bedtime as needed for sleep. For sleep  30 tablet  0   No current facility-administered medications for this visit.     No Known Allergies  REVIEW OF SYSTEMS: Skin:  No history of rash.  No history of abnormal moles. Infection:  No history of hepatitis or HIV.  No history of MRSA. Neurologic:  Bell's palsy at 67 yo.  This has resolved.  She is taking the oxycodone just about daily because of pain in her right leg/knee. Cardiac:  Hypertension x 5 years. No history of heart disease.  No history of prior cardiac catheterization.  Pulmonary:  History of PE x 9 in 2007 in conjunction with her left knee replacement.   No OSA/CPAP.  Endocrine:  Diabetes mellitus x 15 years.  She had a thyroidectomy by Dr. 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 Gastrointestinal:  No history of stomach disease.  No history of liver disease.  No history of gall bladder disease.  No history of pancreas disease.  Colonoscopy by Dr. Outlaw around 2012. Urologic:  No history of kidney stones.  No history of bladder infections. Musculoskeletal:  Left knee replacement 2007 complicated with 9 PE's.  Saw Dr. Wright at the time.  Not on anticoagulation now.  Right knee replacement - Jan 2014 - complicated with pain and edema of the right leg. Hematologic:  No  bleeding disorder.  No history of anemia.  Not anticoagulated. Psycho-social:  The patient is oriented.   The patient has no obvious psychologic or social impairment to understanding our conversation and plan.  SOCIAL and FAMILY HISTORY: Married. Husband is with her. She makes and sells pottery.  Though her husband wants to "retire". Has 4 children:  Ages 33, 42, 43, 46.  They travel with her to show her pottery.    PHYSICAL EXAM: There were no vitals taken for this visit.  General: WN obese F who is alert and generally healthy appearing.  HEENT: Normal. Pupils equal. Neck: Supple. No mass.  Thyroid scar well healed. Lymph Nodes:  No supraclavicular, cervical nodes, or axillary node. Lungs: Clear to auscultation and symmetric breath sounds. Heart:  RRR. No murmur or rub. Breasts:  Right:  Normal  Left:  Bruise at 4 o'clock.  No distinct mass.  No nipple discharge.  Abdomen: Soft. No mass. No tenderness. No hernia. Normal bowel sounds.  Pfannenstiel scar. Has diastasis recti. Rectal: Not done. Extremities:  3+ edema in right leg.  She had leg propped up during exam.  She is going to Dr. Weiner's office today for further tx.  Bilateral knee scars. Neurologic:  Grossly intact to motor and sensory function. Psychiatric: Has normal mood and affect. Behavior is normal.   DATA REVIEWED: Epic notes and path.  Justeen Hehr, MD,  FACS [facility] [address]  [address]   [place], [place]    [address] Phone:  [phone] FAX:  [phone]  

## 2012-12-28 NOTE — Transfer of Care (Signed)
Immediate Anesthesia Transfer of Care Note  Patient: [name]  Procedure(s) Performed: Procedure(s): BREAST LUMPECTOMY WITH NEEDLE LOCALIZATION (Left)  Patient Location: PACU  Anesthesia Type:General  Level of Consciousness: awake, oriented and patient cooperative  Airway & Oxygen Therapy: Patient Spontanous Breathing and Patient connected to face mask oxygen  Post-op Assessment: Report given to PACU RN and Post -op Vital signs reviewed and stable  Post vital signs: Reviewed and stable  Complications: No apparent anesthesia complications

## 2012-12-28 NOTE — Op Note (Signed)
12/28/2012  10:58 AM  PATIENT:  [name] DOB: 11/02/1945 MRN: [id number]  PREOP DIAGNOSIS:  left breast cancer  POSTOP DIAGNOSIS:   Left breast cancer (DCIS), 4 o'clock position (Tis, N0)  PROCEDURE:   Procedure(s):  Left BREAST LUMPECTOMY WITH NEEDLE LOCALIZATION  SURGEON:   Ovidio Kin, M.D.  ANESTHESIA:   none  Anesthesiologist: Kerby Nora, MD CRNA: Gar Gibbon, CRNA  General  EBL:  MIminal  ml  DRAINS: none   LOCAL MEDICATIONS USED:   30 cc 1/4% marcaine  SPECIMEN:   Breast mass  COUNTS CORRECT:  YES  INDICATIONS FOR PROCEDURE:  YESLIN Montgomery is a 67 y.o. (DOB: 06/04/1945)  female whose primary care physician is MCNEILL,WENDY, MD and comes for left breast lumpectomy.  [facility] with Drs. Magrinat and Basilio Cairo.  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   Options for breast cancer treatment were discussed with the patient. She elected to proceed with lumpectomy.  She does not need a lymph node biopsy.    The indications and potential complications of surgery were explained to the patient. Potential complications include, but are not limited to, bleeding, infection, the need for further surgery, and nerve injury.     The patient had a needle loc wire placed at the 4 o'clock position of the left breast [facility] by Dr. Sharin Mons.  OPERATIVE NOTE:  The patient was taken to room # 6 at [facility] where she underwent a general anesthesia  supervised by Anesthesiologist: Kerby Nora, MD CRNA: Gar Gibbon, CRNA. Her left breast and axilla were prepped with  ChloraPrep and sterilely draped.   A time-out and the surgical check list was reviewed.   I followed the wire which was about at the 4 o'clock position of the left breast. I cut down around the wire and tried to take an ellipse of breast tissue around the tumor by at least 1 cm.   I excised this block of breast tissue approximately 8 cm by 6 cm  in diameter. I did take  the dissection down to the pectoralis major. I painted the lumpectomy specimen with the 6 color paint kit and did a specimen mammogram which confirmed the mass, clip and the wire were all in the right position.   I then irrigated the wound with saline. I infiltrated approximately 30 mL of 1% local between the  Incisions.  I placed 6 clips to mark biopsy cavity, at 12, 3, 6, and 9 o'clock. Two clips were placed on the pectoralis major.   I then closed all the wounds in layers using 3-0 Vicryl sutures for the deep layer. At the skin, I closed the incisions with a 5-0 Monocryl suture. The incisions were then painted with Dermabond.  She had gauze place over the wounds and placed in a breast binder.  The patient tolerated the procedure well, was transported to the recovery room in good condition. Sponge and needle count were correct at the end of the case.   Final pathology is pending.  Ovidio Kin, MD, [facility] Pager: [phone] Office phone:  [phone]

## 2012-12-28 NOTE — Anesthesia Postprocedure Evaluation (Signed)
  Anesthesia Post-op Note  Patient: [name]  Procedure(s) Performed: Procedure(s): BREAST LUMPECTOMY WITH NEEDLE LOCALIZATION (Left)  Patient Location: PACU  Anesthesia Type:General  Level of Consciousness: awake, alert  and oriented  Airway and Oxygen Therapy: Patient Spontanous Breathing  Post-op Pain: mild  Post-op Assessment: Post-op Vital signs reviewed  Post-op Vital Signs: Reviewed  Complications: No apparent anesthesia complications

## 2012-12-28 NOTE — Anesthesia Procedure Notes (Signed)
Procedure Name: LMA Insertion Date/Time: 12/28/2012 9:54 AM Performed by: Gar Gibbon Pre-anesthesia Checklist: Patient identified, Emergency Drugs available, Suction available and Patient being monitored Patient Re-evaluated:Patient Re-evaluated prior to inductionOxygen Delivery Method: Circle System Utilized Preoxygenation: Pre-oxygenation with 100% oxygen Intubation Type: IV induction Ventilation: Mask ventilation without difficulty LMA: LMA with gastric port inserted LMA Size: 4.0 Number of attempts: 1 Airway Equipment and Method: bite block Placement Confirmation: positive ETCO2 Tube secured with: Tape Dental Injury: Teeth and Oropharynx as per pre-operative assessment

## 2012-12-28 NOTE — Anesthesia Preprocedure Evaluation (Signed)
Anesthesia Evaluation  Patient identified by MRN, date of birth, ID band Patient awake    Reviewed: Allergy & Precautions, H&P , NPO status , Patient's Chart, lab work & pertinent test results  History of Anesthesia Complications (+) PONV  Airway Mallampati: II TM Distance: >3 FB Neck ROM: Full    Dental  (+) Teeth Intact and Dental Advisory Given   Pulmonary  breath sounds clear to auscultation        Cardiovascular Rhythm:Regular Rate:Normal     Neuro/Psych    GI/Hepatic   Endo/Other  diabetes, Type 2, Oral Hypoglycemic AgentsMorbid obesity  Renal/GU      Musculoskeletal   Abdominal   Peds  Hematology   Anesthesia Other Findings   Reproductive/Obstetrics                           Anesthesia Physical Anesthesia Plan  ASA: III  Anesthesia Plan: General   Post-op Pain Management:    Induction: Intravenous  Airway Management Planned: LMA  Additional Equipment:   Intra-op Plan:   Post-operative Plan: Extubation in OR  Informed Consent: I have reviewed the patients History and Physical, chart, labs and discussed the procedure including the risks, benefits and alternatives for the proposed anesthesia with the patient or authorized representative who has indicated his/her understanding and acceptance.   Dental advisory given  Plan Discussed with: CRNA, Anesthesiologist and Surgeon  Anesthesia Plan Comments:         Anesthesia Quick Evaluation

## 2012-12-28 NOTE — Interval H&P Note (Signed)
History and Physical Interval Note:  12/28/2012 9:41 AM  [name]  has presented today for surgery, with the diagnosis of left breast cancer  The various methods of treatment have been discussed with the patient and family.  Husband is with the patient.  After consideration of risks, benefits and other options for treatment, the patient has consented to  Procedure(s): BREAST LUMPECTOMY WITH NEEDLE LOCALIZATION (Left) as a surgical intervention .    The patient's history has been reviewed, patient examined, no change in status, stable for surgery.  I have reviewed the patient's chart and labs.  Questions were answered to the patient's satisfaction.     Kritika Stukes H

## 2012-12-29 ENCOUNTER — Encounter (HOSPITAL_BASED_OUTPATIENT_CLINIC_OR_DEPARTMENT_OTHER): Payer: Self-pay | Admitting: Surgery

## 2013-01-02 ENCOUNTER — Telehealth: Payer: Self-pay | Admitting: *Deleted

## 2013-01-02 ENCOUNTER — Encounter (INDEPENDENT_AMBULATORY_CARE_PROVIDER_SITE_OTHER): Payer: Self-pay

## 2013-01-02 NOTE — Telephone Encounter (Signed)
Left vm for pt to return call concerning surgery and f/u appts.

## 2013-01-09 ENCOUNTER — Encounter: Payer: Self-pay | Admitting: Radiation Oncology

## 2013-01-09 DIAGNOSIS — C50919 Malignant neoplasm of unspecified site of unspecified female breast: Secondary | ICD-10-CM | POA: Insufficient documentation

## 2013-01-09 NOTE — Progress Notes (Signed)
Location of Breast Cancer: Left Breast, upper outer  Histology per Pathology Report:  12/28/12  Breast, lumpectomy, Left - LOW GRADE DUCTAL CARCINOMA IN SITU WITH ASSOCIATED MICROCALCIFICATION, SPANNING 0.9 CM IN GREATEST DIMENSION. - DUCTAL CARCINOMA IN SITU IS EXTREMELY CLOSE TO POSTERIOR MARGIN (0.1 CM). - OTHER MARGINS ARE NEGATIVE. - SEE ONCOLOGY TEMPLATE. Microscopic Comment  Receptor Status: ER(100%), PR (44%), Her2-neu (na)  Did patient present with symptoms (if so, please note symptoms) or was this found on screening mammography?:  "Routine screening mammography at the [facility] in [place] 11/27/2012 showing new calcifications in the left breast upper outer quadrant. Additional views 11/28/2012 showed a 1.4 cm cluster of pleomorphic calcifications, for which biopsy was recommended."   Past/Anticipated interventions by surgeon, if any: Left Breast Lumpectomy  Past/Anticipated interventions by medical oncology, if any: Chemotherapy hormone Replacement Therapy following Radiation Therapy  Lymphedema issues, if any:  no  Pain issues, if any: chronic right leg pain due to lymphedema  SAFETY ISSUES:  Prior radiation? no  Pacemaker/ICD  no  Possible current pregnancy?  no  Is the patient on methotrexate? no  Current Complaints / other details:  Married, she is a Veterinary surgeon

## 2013-01-10 ENCOUNTER — Encounter: Payer: Self-pay | Admitting: Radiation Oncology

## 2013-01-10 ENCOUNTER — Ambulatory Visit
Admission: RE | Admit: 2013-01-10 | Discharge: 2013-01-10 | Disposition: A | Discharge status: Home / Self Care | Payer: Medicare Other | Source: Ambulatory Visit | Attending: Radiation Oncology | Admitting: Radiation Oncology

## 2013-01-10 VITALS — BP 104/69 | HR 81 | Temp 97.7°F | Resp 20 | Wt 283.5 lb

## 2013-01-10 DIAGNOSIS — C50412 Malignant neoplasm of upper-outer quadrant of left female breast: Secondary | ICD-10-CM

## 2013-01-10 DIAGNOSIS — D0590 Unspecified type of carcinoma in situ of unspecified breast: Secondary | ICD-10-CM | POA: Insufficient documentation

## 2013-01-10 NOTE — Progress Notes (Signed)
Please see the Nurse Progress Note in the MD Initial Consult Encounter for this patient. 

## 2013-01-10 NOTE — Progress Notes (Signed)
Radiation Oncology         [phone] ________________________________  Name: SHELETHA BOW MRN: [id number]  Date: 01/10/2013  DOB: March 03, 1956  Follow-Up Visit Note  Outpatient  CC: [name], MD  Kandis Cocking, MD  Diagnosis and Prior Radiotherapy:   Low Grade ER PR + DCIS, left breast  Narrative:  The patient returns today for routine follow-up.   She is status post surgery with close posterior margin, but negative margins. I discussed her with radiology and they recommend a post operative mammogram to rule out residual suspicious calcifications. She has post-op soreness.  Otherwise well, continues working with [employer].        Breast, lumpectomy, Left - LOW GRADE DUCTAL CARCINOMA IN SITU WITH ASSOCIATED MICROCALCIFICATION, SPANNING 0.9 CM IN GREATEST DIMENSION. - DUCTAL CARCINOMA IN SITU IS EXTREMELY CLOSE TO POSTERIOR MARGIN (0.1 CM). - OTHER MARGINS ARE NEGATIVE. - SEE ONCOLOGY TEMPLATE. Microscopic Comment BREAST, IN SITU CARCINOMA Specimen, including laterality: Left partial breast. Procedure (include lymph node sampling sentinel-non-sentinel): Left breast lumpectomy. Grade of carcinoma: Low grade. Necrosis: There is a minimal amount of necrotic debris in some of the glands showing low grade carcinoma in situ. Estimated tumor size: (glass slide measurement): 0.9 cm. Treatment effect: Not applicable. Distance to closest margin: 0.1 cm (posterior margin). Breast prognostic profile: Performed on previous case, [phone]. Estrogen receptor: 100%, positive. Progesterone receptor: 44%, positive. Lymph nodes: No lymph nodes received. Additional findings: Biopsy site changes with hemorrhage, fibrosis and fat necrosis. Fibrocystic changes with microcalcification. Benign skin present. TNM: pTis, NX, MX.                 ALLERGIES:  has No Known Allergies.  Meds: Current Outpatient Prescriptions  Medication Sig Dispense Refill  . ACCU-CHEK AVIVA PLUS test  strip       . aspirin EC 81 MG tablet Take 81 mg by mouth daily.      . celecoxib (CELEBREX) 200 MG capsule Take 1 capsule (200 mg total) by mouth daily. For pain  30 capsule  0  . Cholecalciferol (VITAMIN D-3) 5000 UNITS TABS Take 5,000 Units by mouth daily.      . diclofenac sodium (VOLTAREN) 1 % GEL Apply topically as needed.      Marland Kitchen HYDROcodone-acetaminophen (NORCO/VICODIN) 5-325 MG per tablet Take 1-2 tablets by mouth every 6 (six) hours as needed for pain.  30 tablet  1  . levothyroxine (SYNTHROID, LEVOTHROID) 200 MCG tablet Take 200 mcg by mouth daily.      Marland Kitchen lisinopril (PRINIVIL,ZESTRIL) 10 MG tablet Take 10 mg by mouth daily.      . metFORMIN (GLUCOPHAGE) 1000 MG tablet Take 500-1,000 mg by mouth 2 (two) times daily with a meal. 500mg  in the morning and 1000mg  at night      . oxyCODONE (OXY IR/ROXICODONE) 5 MG immediate release tablet Take 1-2 tablets (5-10 mg total) by mouth every 3 (three) hours as needed for pain.  100 tablet  0  . simvastatin (ZOCOR) 40 MG tablet Take 40 mg by mouth daily.      Marland Kitchen zolpidem (AMBIEN) 10 MG tablet Take 1 tablet (10 mg total) by mouth at bedtime as needed for sleep. For sleep  30 tablet  0   No current facility-administered medications for this encounter.    Physical Findings: The patient is in no acute distress. Patient is alert and oriented.  weight is 283 lb 8 oz (128.595 kg). Her oral temperature is 97.7 F (36.5 C). Her blood pressure  is 104/69 and her pulse is 81. Her respiration is 20. Marland Kitchen    Left breast lumpectomy scar healing well.    Lab Findings: Lab Results  Component Value Date   WBC 4.6 12/20/2012   HGB 13.4 12/28/2012   HCT 39.0 12/20/2012   MCV 83.0 12/20/2012   PLT 191 12/20/2012    Radiographic Findings: Mr Breast Bilateral W Wo Contrast  12/19/2012   *RADIOLOGY REPORT*  Clinical Data:Low grade ductal carcinoma in situ diagnosed by stereotactic core needle biopsy of calcifications in the 3 o'clock position of the left breast,  approximately 11 cm deep to the nipple.  BILATERAL BREAST MRI WITH AND WITHOUT CONTRAST  Technique: Multiplanar, multisequence MR images of both breasts were obtained prior to and following the intravenous administration of 20ml of Multihance.  THREE-DIMENSIONAL MR IMAGE RENDERING ON INDEPENDENT WORKSTATION: Three-dimensional MR images were rendered by post-processing  of the original MR data on an independent DynaCad workstation. The three-dimensional MR images were interpreted, and findings are reported in the following complete MRI report for this study.  Comparison:  Post biopsy mamm[facility] the left [facility] dated 12/13/2012.  Outside mammograms from [facility] dated 11/28/2012 and 11/27/2012.  FINDINGS:  Breast composition:  b:  Scattered fibroglandular tissue  Background parenchymal enhancement: Moderate.  Right breast:  No mass or abnormal enhancement.  Left breast:  No mass or abnormal enhancement.  The stereotactic biopsy site containing the biopsy clip is identified in the 3 o'clock position of the left breast, approximately 9 cm deep to the nipple.  Lymph nodes:  No abnormal appearing lymph nodes.  Ancillary findings:  None.  IMPRESSION: Slight biopsy change noted at the stereotactic biopsy site at 3 o'clock 9 cm deep to the left nipple.  No additional findings noted.  RECOMMENDATION: Treatment plan  BI-RADS CATEGORY 6:  Known biopsy-proven malignancy - appropriate action should be taken.   Original Report Authenticated By: Cain Saupe, M.D.   Mm Radiologist Eval And Mgmt  12/15/2012   *RADIOLOGY REPORT*  ESTABLISHED PATIENT OFFICE VISIT - LEVEL II [id number])  Chief Complaint:  Status post left breast stereotactic biopsy.  History:  Patient presents after core biopsy of the left breast for calcifications.  Exam:  Biopsy incision is clean and intact.  Small amount of ecchymosis is identified at the biopsy site.  Pathology: Ductal carcinoma in situ  with calcifications.  Assessment and Plan:  The patient is scheduled for multidisciplinary clinic on 12/20/2012.  She had been scheduled for her breast MRI examination on 12/19/2012.   Original Report Authenticated By: Jerene Dilling, M.D.   Mm Lt Breast Bx W Loc Dev 1st Lesion Image Bx Spec Stereo Guide  12/13/2012   *RADIOLOGY REPORT*  Clinical Data:  Left breast calcifications  STEREOTACTIC-GUIDED VACUUM ASSISTED BIOPSY OF THE LEFT BREAST AND SPECIMEN RADIOGRAPH  Comparison: Previous exams.  I met with the patient and we discussed the procedure of stereotactic-guided biopsy, including benefits and alternatives. We discussed the high likelihood of a successful procedure. We discussed the risks of the procedure, including infection, bleeding, tissue injury, clip migration, and inadequate sampling. Informed, written consent was given. The usual time-out protocol was performed immediately prior to the procedure.  Using sterile technique and 2% Lidocaine as local anesthetic, under stereotactic guidance, a 9 gauge vacuum-assisted device was used to perform core needle biopsy of calcifications in the lateral aspect of the left breast using an inferior approach.  Specimen radiograph was performed, showing calcifications  are present the tissue samples.  Specimens with calcifications are identified for pathology.  At the conclusion of the procedure, a tophat shaped tissue marker clip was deployed into the biopsy cavity.  Follow-up 2-view mammogram confirmed clip place.  IMPRESSION: Stereotactic-guided biopsy of the left breast.  No apparent complications.   Original Report Authenticated By: Baird Lyons, M.D.   Mm Lt Plc Breast Loc Dev   1st Lesion  Inc Mammo Guide  12/28/2012   *RADIOLOGY REPORT*  Clinical Data: Recent diagnosis of low grade DCIS in the outer left breast.  NEEDLE LOCALIZATION WITH MAMMOGRAPHIC GUIDANCE AND SPECIMEN RADIOGRAPH  Comparison:  Previous exam(s).  Patient presents for needle  localization prior to left breast lumpectomy. I met with the patient and we discussed the procedure of needle localization including benefits and alternatives. We discussed the high likelihood of a successful procedure. We discussed the risks of the procedure, including infection, bleeding, tissue injury, and further surgery. Informed, written consent was given. The usual time-out protocol was performed immediately prior to the procedure.  Using mammographic guidance, sterile technique, 2% lidocaine and a 9 cm modified Kopans needle the hourglass shaped tissue marker clip was localized using a lateral approach.  The films are marked for Dr. Ezzard Standing. Specimen radiograph was performed at Day Surgery and confirms that the clip and adjacent calcifications are present in the tissue sample.  The specimen is marked for pathology by the OR staff at [facility] and [facility].  IMPRESSION: Needle localization left breast.  No apparent complications.   Original Report Authenticated By: Hulan Saas, M.D.    Impression/Plan:  Lovely 67 yo with DCIS, L breast.    We once again discussed the risks, benefits, and side effects of radiotherapy. We discussed that radiation would take approximately 6-7 weeks to complete. We spoke about acute effects including skin irritation and fatigue as well as much less common late effects including lung and heart irritation. We spoke about the latest technology that is used to minimize the risk of late effects for breast cancer patients undergoing radiotherapy. No guarantees of treatment were given. The patient is enthusiastic about proceeding with treatment. I look forward to participating in the patient's care. Consent signed today.  I will order a post-op mammogram to take place in about 2 weeks (she is still quite sore), with follow-up and simulation a day or two after.  At that time we can review the results and make sure it's appropriate to continue with treatment planning.  I am scheduled to  start maternity leave in early November, sooner if necessary.  I let the patient know that once I am on leave, one of my partners will continue their care. They are comfortable with this plan.  I spent 30 minutes face to face with the patient and more than 50% of that time was spent in counseling and/or coordination of care. _____________________________________   Lonie Peak, MD

## 2013-01-11 ENCOUNTER — Ambulatory Visit (INDEPENDENT_AMBULATORY_CARE_PROVIDER_SITE_OTHER): Payer: Medicare Other | Admitting: Surgery

## 2013-01-11 ENCOUNTER — Telehealth: Payer: Self-pay | Admitting: *Deleted

## 2013-01-11 ENCOUNTER — Encounter (INDEPENDENT_AMBULATORY_CARE_PROVIDER_SITE_OTHER): Payer: Self-pay | Admitting: Surgery

## 2013-01-11 VITALS — BP 116/76 | HR 72 | Temp 97.9°F | Resp 16 | Ht 66.0 in | Wt 254.6 lb

## 2013-01-11 DIAGNOSIS — C50422 Malignant neoplasm of upper-outer quadrant of left male breast: Secondary | ICD-10-CM

## 2013-01-11 DIAGNOSIS — C50929 Malignant neoplasm of unspecified site of unspecified male breast: Secondary | ICD-10-CM

## 2013-01-11 NOTE — Progress Notes (Signed)
Re:   [name] DOB:   05-17-1945 MRN:   [id number]  [facility]  ASSESSMENT AND PLAN: 1.  Left breast cancer (Tis, N0)  DCIS, 0.9 cm, ER - 100%, PR - 30%  Lumpectomy - 12/28/2012 - D. Makynlie Rossini  Oncology - [facility]  Plan to start rad tx in a couple of weeks  Return to see me in 6 months.  2.  Hypertension 3.  Diabetes Mellitus 4.  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  Morbid obesity 6.  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 Gerrit Friends 7.  Right knee replacement for which she is still having pain and lymphedema.  Chief Complaint  Patient presents with  . Routine Post Op    po lumpectomy   REFERRING PHYSICIAN: MCNEILL,WENDY, MD  HISTORY OF PRESENT ILLNESS: [name] is a 67 y.o. (DOB: Feb 01, 1946)   female whose primary care physician is MCNEILL,WENDY, MD and comes for follow up of left breast lumpectomy.  Doing well.  Wound looks good.  She is to start rad tx in a couple of week.s  History of breast Cancer: She went for routine mammography in [place].  they saw something abnormal in her left breast and wanted to send her to [facility].  But the patient talked to her PCP, Dr. Uvaldo Rising, who encouraged her to have her care in [place].  [facility] which showed low grade DCIS (Accession: [id number]). She had an MRI on 12/19/2012 which showed slight biopsy change in the left breast at 3 o'clock. She has an aunt on her father's side who had breast cancer. She is not on hormone meds.  She had a hysterectomy by Dr. Perlie Gold in 1981.   Past Medical History  Diagnosis Date  . Right knee DJD   . Diabetes   . History of pulmonary embolism   . Hypertension   . High cholesterol   . Pulmonary embolism   . Pneumonia   . Bell's palsy   . 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 Subcutaneous hematoma left leg just outside knee joint 04/10/2012    Culture was done of this fluid collection  is pending  Gram stain had white cells but no organism  . Breast cancer 12/13/2012    left uoq  . Thyroid disease   . Wears glasses   . Wears partial dentures     upper partial  . Sleep apnea     uses a cpap  . PONV (postoperative nausea and vomiting)     still got sick 1/14-may need scop patch  . Edema leg      Current Outpatient Prescriptions  Medication Sig Dispense Refill  . ACCU-CHEK AVIVA PLUS test strip       . aspirin EC 81 MG tablet Take 81 mg by mouth daily.      . celecoxib (CELEBREX) 200 MG capsule Take 1 capsule (200 mg total) by mouth daily. For pain  30 capsule  0  . Cholecalciferol (VITAMIN D-3) 5000 UNITS TABS Take 5,000 Units by mouth daily.      . diclofenac sodium (VOLTAREN) 1 % GEL Apply topically as needed.      Marland Kitchen HYDROcodone-acetaminophen (NORCO/VICODIN) 5-325 MG per tablet Take 1-2 tablets by mouth every 6 (six) hours as needed for pain.  30 tablet  1  . levothyroxine (SYNTHROID, LEVOTHROID) 200 MCG tablet Take 200 mcg by mouth  daily.      . lisinopril (PRINIVIL,ZESTRIL) 10 MG tablet Take 10 mg by mouth daily.      . metFORMIN (GLUCOPHAGE) 1000 MG tablet Take 500-1,000 mg by mouth 2 (two) times daily with a meal. 500mg  in the morning and 1000mg  at night      . oxyCODONE (OXY IR/ROXICODONE) 5 MG immediate release tablet Take 1-2 tablets (5-10 mg total) by mouth every 3 (three) hours as needed for pain.  100 tablet  0  . simvastatin (ZOCOR) 40 MG tablet Take 40 mg by mouth daily.      Marland Kitchen zolpidem (AMBIEN) 10 MG tablet Take 1 tablet (10 mg total) by mouth at bedtime as needed for sleep. For sleep  30 tablet  0   No current facility-administered medications for this visit.     No Known Allergies  REVIEW OF SYSTEMS: Neurologic:  Bell's palsy at 67 yo.  This has resolved.  She is taking the oxycodone just about daily because of pain in her right leg/knee. Cardiac:  Hypertension x 5 years. No history of heart disease.  No history of prior cardiac catheterization.   Pulmonary:  History of PE x 9 in 2007 in conjunction with her left knee replacement.   No OSA/CPAP.  Endocrine:  Diabetes mellitus x 15 years.  She had a thyroidectomy by Dr. 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 Gastrointestinal:  No history of stomach disease.  No history of liver disease.  No history of gall bladder disease.  No history of pancreas disease.  Colonoscopy by Dr. Dulce Sellar around 2012. Urologic:  No history of kidney stones.  No history of bladder infections. Musculoskeletal:  Left knee replacement 2007 complicated with 9 PE's.  Saw Dr. Delford Field at the time.  Not on anticoagulation now.  Right knee replacement - Jan 2014 - complicated with pain and edema of the right leg.   SOCIAL and FAMILY HISTORY: Married. Husband is with her. She makes and sells pottery.  Though her husband wants to "retire". Has 4 children:  Ages 50, 21, 80, 44.  They travel with her to show her pottery.  PHYSICAL EXAM: BP 116/76  Pulse 72  Temp(Src) 97.9 F (36.6 C) (Temporal)  Resp 16  Ht 5\' 6"  (1.676 m)  Wt 254 lb 9.6 oz (115.486 kg)  BMI 41.11 kg/m2  General: WN obese F who is alert and generally healthy appearing.  Breasts:  Right:  Normal  Left:  Incision at 4 o'clock looks good. Extremities:  3+ edema in right leg.  She had leg propped up during exam.    DATA REVIEWED: Path to patient.  Ovidio Kin, MD,  [facility] [address] [place]  [address]   [place], [place]    [address] Phone:  [phone] FAX:  [phone]

## 2013-01-11 NOTE — Telephone Encounter (Signed)
CALLED PATIENT TO INFORM OF MAMMO, FNC VISIT AND SIM, LVM FOR A RETURN CALL

## 2013-01-12 NOTE — Addendum Note (Signed)
Encounter addended by: Darrian Goodwill Mintz Camerin Jimenez, RN on: 01/12/2013  6:25 PM<BR>     Documentation filed: Charges VN

## 2013-01-12 NOTE — Addendum Note (Signed)
Encounter addended by: Constance Hackenberg Mintz Yashua Bracco, RN on: 01/12/2013  6:34 PM<BR>     Documentation filed: Charges VN

## 2013-01-17 ENCOUNTER — Ambulatory Visit
Admission: RE | Admit: 2013-01-17 | Discharge: 2013-01-17 | Disposition: A | Discharge status: Home / Self Care | Payer: Medicare Other | Source: Ambulatory Visit | Attending: Radiation Oncology | Admitting: Radiation Oncology

## 2013-01-17 DIAGNOSIS — C50412 Malignant neoplasm of upper-outer quadrant of left female breast: Secondary | ICD-10-CM

## 2013-01-24 ENCOUNTER — Ambulatory Visit
Admission: RE | Admit: 2013-01-24 | Discharge: 2013-01-24 | Disposition: A | Discharge status: Home / Self Care | Payer: Medicare Other | Source: Ambulatory Visit | Attending: Radiation Oncology | Admitting: Radiation Oncology

## 2013-01-24 ENCOUNTER — Encounter: Payer: Self-pay | Admitting: Radiation Oncology

## 2013-01-24 VITALS — BP 145/93 | HR 88 | Temp 98.2°F | Ht 66.0 in | Wt 283.0 lb

## 2013-01-24 DIAGNOSIS — R5381 Other malaise: Secondary | ICD-10-CM | POA: Insufficient documentation

## 2013-01-24 DIAGNOSIS — D0590 Unspecified type of carcinoma in situ of unspecified breast: Secondary | ICD-10-CM | POA: Insufficient documentation

## 2013-01-24 DIAGNOSIS — Z79899 Other long term (current) drug therapy: Secondary | ICD-10-CM | POA: Insufficient documentation

## 2013-01-24 DIAGNOSIS — Z510 Encounter for antineoplastic radiation therapy: Secondary | ICD-10-CM | POA: Insufficient documentation

## 2013-01-24 DIAGNOSIS — C50912 Malignant neoplasm of unspecified site of left female breast: Secondary | ICD-10-CM

## 2013-01-24 DIAGNOSIS — N644 Mastodynia: Secondary | ICD-10-CM | POA: Insufficient documentation

## 2013-01-24 DIAGNOSIS — C50412 Malignant neoplasm of upper-outer quadrant of left female breast: Secondary | ICD-10-CM

## 2013-01-24 NOTE — Progress Notes (Signed)
  Radiation Oncology         [phone] ________________________________  Name: [name] MRN: [id number]  Date: 01/24/2013  DOB: 1946/03/05  SIMULATION AND TREATMENT PLANNING NOTE  outpatient  DIAGNOSIS:  Left breast DCIS  NARRATIVE:  The patient was brought to the CT Simulation planning suite.  Identity was confirmed.  All relevant records and images related to the planned course of therapy were reviewed.  The patient freely provided informed written consent to proceed with treatment after reviewing the details related to the planned course of therapy. The consent form was witnessed and verified by the simulation staff.    Then, the patient was set-up in a stable reproducible  supine position for radiation therapy.  CT images were obtained.  Surface markings were placed.  The CT images were loaded into the planning software.     RESPIRATORY MOTION MANAGEMENT SIMULATION  NARRATIVE:  In order to account for effect of respiratory motion on target structures and other organs in the planning and delivery of radiotherapy, this patient underwent respiratory motion management simulation.  To accomplish this, when the patient was brought to the CT simulation planning suite, 4D respiratory motion management CT images were obtained.  The CT images were loaded into the planning software.  Then, using a variety of tools including Cine, MIP, and standard views, the target volume was delineated.  Avoidance structures were contoured.  Treatment planning then occurred.    TREATMENT PLANNING NOTE: Treatment planning then occurred.  The radiation prescription was entered and confirmed.    A total of 3 medically necessary complex treatment devices were fabricated and supervised by me - accuform and 2 tangential fields. I have requested : 3D Simulation  I have requested a DVH of the following structures: heart, lungs and lumpectomy cavity.   The patient will receive 50.4 Gy in 28 fractions with opposed  tangents to the left breast. This will eventually be followed by a 2-3 field photon boost to additional 10 Gy in 5 fractions to cover the lumpectomy cavity.   -----------------------------------  Lonie Peak, MD

## 2013-01-24 NOTE — Progress Notes (Addendum)
Radiation Oncology         [phone] ________________________________  Name: [name] MRN: [id number]  Date: 01/24/2013  DOB: [birth year]  Follow-Up Visit Note  outpatient  CC: [name], MD  Kandis Cocking, MD  Diagnosis:   Low Grade ER PR + DCIS, left breast  Narrative:  The patient returns today for routine follow-up. Doing well.  We discussed her at tumor board this AM. Post op mammography showed no residual suspicious calcifications. She is ready for RT planning.              She had some questions about the nature of DCIS in general as well as scheduling for RT that we discussed. We discussed margin status again, and the purpose of RT. All questions answered.  ALLERGIES:  has No Known Allergies.  Meds: Current Outpatient Prescriptions  Medication Sig Dispense Refill  . ACCU-CHEK AVIVA PLUS test strip       . aspirin EC 81 MG tablet Take 81 mg by mouth daily.      . celecoxib (CELEBREX) 200 MG capsule Take 1 capsule (200 mg total) by mouth daily. For pain  30 capsule  0  . Cholecalciferol (VITAMIN D-3) 5000 UNITS TABS Take 5,000 Units by mouth daily.      . diclofenac sodium (VOLTAREN) 1 % GEL Apply topically as needed.      Marland Kitchen HYDROcodone-acetaminophen (NORCO/VICODIN) 5-325 MG per tablet Take 1-2 tablets by mouth every 6 (six) hours as needed for pain.  30 tablet  1  . levothyroxine (SYNTHROID, LEVOTHROID) 200 MCG tablet Take 200 mcg by mouth daily.      Marland Kitchen lisinopril (PRINIVIL,ZESTRIL) 10 MG tablet Take 10 mg by mouth daily.      . meloxicam (MOBIC) 15 MG tablet Take 15 mg by mouth as needed.      . metFORMIN (GLUCOPHAGE) 1000 MG tablet Take 500-1,000 mg by mouth 2 (two) times daily with a meal. 500mg  in the morning and 1000mg  at night      . oxyCODONE (OXY IR/ROXICODONE) 5 MG immediate release tablet Take 1-2 tablets (5-10 mg total) by mouth every 3 (three) hours as needed for pain.  100 tablet  0  . simvastatin (ZOCOR) 40 MG tablet Take 40 mg by mouth daily.       Marland Kitchen zolpidem (AMBIEN) 10 MG tablet Take 1 tablet (10 mg total) by mouth at bedtime as needed for sleep. For sleep  30 tablet  0   No current facility-administered medications for this encounter.    Physical Findings: The patient is in no acute distress. Patient is alert and oriented. Left breast is well healed from surgery.   Lab Findings: Lab Results  Component Value Date   WBC 4.6 12/20/2012   HGB 13.4 12/28/2012   HCT 39.0 12/20/2012   MCV 83.0 12/20/2012   PLT 191 12/20/2012    Radiographic Findings: Mm Digital Diagnostic Unilat L  01/17/2013   CLINICAL DATA:  Status post left lumpectomy with PATHOLOGY demonstrating DCIS with close margins.  EXAM: DIGITAL DIAGNOSTIC  LEFT MAMMOGRAM with CAD  COMPARISON:  None.  ACR Breast Density Category b: There are scattered areas of fibroglandular density.  FINDINGS: There are expected post lumpectomy changes. Postoperative hematoma/ seroma is identified in the lumpectomy bed, measuring at least 7 x 6.7 x 5.6 cm. Compression on the spot magnification view is limited due to patient's pain following surgery. Within these limitations, no residual calcifications are identified.  Mammographic images were processed with  CAD.  IMPRESSION: No residual calcifications identified in the left breast, with limitations as above.  RECOMMENDATION: Treatment planning.  I have discussed the findings and recommendations with the patient. Results were also provided in writing at the conclusion of the visit. If applicable, a reminder letter will be sent to the patient regarding the next appointment.  BI-RADS CATEGORY  2: Benign Finding(s)   Electronically Signed   By: Jerene Dilling M.D.   On: 01/17/2013 08:22    Impression/Plan:  Proceed with simulation today. Anticipate starting whole breast radiotherapy next week. She is pleased with this plan.   I spent 5 minutes face to face with the patient and more than 50% of that time was spent in counseling and/or  coordination of care. _____________________________________   Lonie Peak, MD

## 2013-01-24 NOTE — Progress Notes (Signed)
Please see the Nurse Progress Note in the MD Initial Consult Encounter for this patient. 

## 2013-01-24 NOTE — Progress Notes (Addendum)
Location of Breast Cancer: Left Breast, upper outer   Histology per Pathology Report:  12/28/12  Breast, lumpectomy, Left  - LOW GRADE DUCTAL CARCINOMA IN SITU WITH ASSOCIATED MICROCALCIFICATION, SPANNING  0.9 CM IN GREATEST DIMENSION.  - DUCTAL CARCINOMA IN SITU IS EXTREMELY CLOSE TO POSTERIOR MARGIN (0.1 CM).  - OTHER MARGINS ARE NEGATIVE.  - SEE ONCOLOGY TEMPLATE.  Microscopic Comment   Receptor Status: ER(100%), PR (44%), Her2-neu (na)   Did patient present with symptoms (if so, please note symptoms) or was this found on screening mammography?:  "Routine screening mammography at the [place] [facility] in [place] 11/27/2012 showing new calcifications in the left breast upper outer quadrant. Additional views 11/28/2012 showed a 1.4 cm cluster of pleomorphic calcifications, for which biopsy was recommended."   Past/Anticipated interventions by surgeon, if any: Left Breast Lumpectomy   Saw Dr. Thalia Bloodgood 10 days ago and he said she was doing great.  Past/Anticipated interventions by medical oncology, if any: Seen by Dr. Darnelle Catalan on 12/20/12.   Hormone Replacement Therapy following Radiation Therapy   Lymphedema issues, if any: no   Pain issues, if any: chronic right leg pain due to lymphedema   SAFETY ISSUES:  Prior radiation? no  Pacemaker/ICD no  Possible current pregnancy? no  Is the patient on methotrexate? No  Current Complaints / other details: Married, she makes and sells pottery   She is having occasional sharp pains in her left breast.  She says they have been happening daily.  She is also worried that there is tape on her left breast left over from surgery that has not come off yet.

## 2013-01-25 NOTE — Addendum Note (Signed)
Encounter addended by: Delynn Flavin, RN on: 01/25/2013  6:41 PM<BR>     Documentation filed: Charges VN

## 2013-01-26 ENCOUNTER — Ambulatory Visit
Admission: RE | Admit: 2013-01-26 | Discharge: 2013-01-26 | Disposition: A | Discharge status: Home / Self Care | Payer: Medicare Other | Source: Ambulatory Visit | Attending: Oncology | Admitting: Oncology

## 2013-01-26 DIAGNOSIS — C50919 Malignant neoplasm of unspecified site of unspecified female breast: Secondary | ICD-10-CM

## 2013-01-26 NOTE — Addendum Note (Signed)
Encounter addended by: Delynn Flavin, RN on: 01/26/2013 10:42 AM<BR>     Documentation filed: Charges VN

## 2013-01-31 ENCOUNTER — Ambulatory Visit
Admission: RE | Admit: 2013-01-31 | Discharge: 2013-01-31 | Disposition: A | Discharge status: Home / Self Care | Payer: Medicare Other | Source: Ambulatory Visit | Attending: Radiation Oncology | Admitting: Radiation Oncology

## 2013-02-01 ENCOUNTER — Ambulatory Visit
Admission: RE | Admit: 2013-02-01 | Discharge: 2013-02-01 | Disposition: A | Discharge status: Home / Self Care | Payer: Medicare Other | Source: Ambulatory Visit | Attending: Radiation Oncology | Admitting: Radiation Oncology

## 2013-02-02 ENCOUNTER — Ambulatory Visit
Admission: RE | Admit: 2013-02-02 | Discharge: 2013-02-02 | Disposition: A | Discharge status: Home / Self Care | Payer: Medicare Other | Source: Ambulatory Visit | Attending: Radiation Oncology | Admitting: Radiation Oncology

## 2013-02-05 ENCOUNTER — Ambulatory Visit
Admission: RE | Admit: 2013-02-05 | Discharge: 2013-02-05 | Disposition: A | Discharge status: Home / Self Care | Payer: Medicare Other | Source: Ambulatory Visit | Attending: Radiation Oncology | Admitting: Radiation Oncology

## 2013-02-05 ENCOUNTER — Encounter: Payer: Self-pay | Admitting: Radiation Oncology

## 2013-02-05 VITALS — BP 121/84 | HR 72 | Temp 97.5°F | Ht 66.0 in | Wt 281.1 lb

## 2013-02-05 DIAGNOSIS — C50412 Malignant neoplasm of upper-outer quadrant of left female breast: Secondary | ICD-10-CM

## 2013-02-05 NOTE — Progress Notes (Signed)
   Weekly Management Note:  outpatient Current Dose:  5.4 Gy  Projected Dose: 60.4 Gy   Narrative:  The patient presents for routine under treatment assessment.  CBCT/MVCT images/Port film x-rays were reviewed.  The chart was checked. Doing well, no complaints  Physical Findings:  height is 5\' 6"  (1.676 m) and weight is 281 lb 1.6 oz (127.506 kg). Her temperature is 97.5 F (36.4 C). Her blood pressure is 121/84 and her pulse is 72.  NAD, skin unchanged over left breast  Impression:  The patient is tolerating radiotherapy.  Plan:  Continue radiotherapy as planned.    ________________________________   Lonie Peak, M.D.

## 2013-02-06 ENCOUNTER — Ambulatory Visit
Admission: RE | Admit: 2013-02-06 | Discharge: 2013-02-06 | Disposition: A | Discharge status: Home / Self Care | Payer: Medicare Other | Source: Ambulatory Visit | Attending: Radiation Oncology | Admitting: Radiation Oncology

## 2013-02-07 ENCOUNTER — Ambulatory Visit
Admission: RE | Admit: 2013-02-07 | Discharge: 2013-02-07 | Disposition: A | Discharge status: Home / Self Care | Payer: Medicare Other | Source: Ambulatory Visit | Attending: Radiation Oncology | Admitting: Radiation Oncology

## 2013-02-08 ENCOUNTER — Ambulatory Visit: Payer: Medicare Other

## 2013-02-08 ENCOUNTER — Ambulatory Visit
Admission: RE | Admit: 2013-02-08 | Discharge: 2013-02-08 | Disposition: A | Discharge status: Home / Self Care | Payer: Medicare Other | Source: Ambulatory Visit | Attending: Radiation Oncology | Admitting: Radiation Oncology

## 2013-02-09 ENCOUNTER — Ambulatory Visit
Admission: RE | Admit: 2013-02-09 | Discharge: 2013-02-09 | Disposition: A | Discharge status: Home / Self Care | Payer: Medicare Other | Source: Ambulatory Visit | Attending: Radiation Oncology | Admitting: Radiation Oncology

## 2013-02-12 ENCOUNTER — Ambulatory Visit
Admission: RE | Admit: 2013-02-12 | Discharge: 2013-02-12 | Disposition: A | Discharge status: Home / Self Care | Payer: Medicare Other | Source: Ambulatory Visit | Attending: Radiation Oncology | Admitting: Radiation Oncology

## 2013-02-13 ENCOUNTER — Ambulatory Visit
Admission: RE | Admit: 2013-02-13 | Discharge: 2013-02-13 | Disposition: A | Discharge status: Home / Self Care | Payer: Medicare Other | Source: Ambulatory Visit | Attending: Radiation Oncology | Admitting: Radiation Oncology

## 2013-02-13 VITALS — BP 126/71 | HR 75 | Temp 98.3°F | Ht 66.0 in | Wt 281.4 lb

## 2013-02-13 DIAGNOSIS — C50412 Malignant neoplasm of upper-outer quadrant of left female breast: Secondary | ICD-10-CM

## 2013-02-13 NOTE — Progress Notes (Addendum)
[name] here for weekly under treat visit.  She has had 9 fraction to her left breast.  She denies pain except for occasional twinges of pain in her right breast.  She does have fatigue.  The skin on her left breast is intact with hyperpigmentation underneath her breast.  She is using radiaplex gel once per day.  She is wondering about the results from her bone density test from 01/26/13.

## 2013-02-13 NOTE — Progress Notes (Signed)
[facility]    Radiation Oncology [address] [place]     Maryln Gottron, M.D. [place], [place] [address]               Billie Lade, M.D., Ph.D. Phone: [phone]      Molli Hazard A. Kathrynn Running, M.D. Fax: [phone]      Radene Gunning, M.D., Ph.D.         Lurline Hare, M.D.         [place] Jack, M.D Weekly Treatment Management Note  Name: [name]     MRN: [id number]        CSN: [id number] Date: 02/13/2013      DOB: [birth year]  CC: Jacqueline Dimitri, MD         Corliss Blacker    Status: Outpatient  Diagnosis: The encounter diagnosis was Breast cancer of upper-outer quadrant of left female breast.  Current Dose: 16.2 Gy  Current Fraction: 9  Planned Dose: 60.4 Gy  Narrative: [name] was seen today for weekly treatment management. The chart was checked and port films  were reviewed. She has noticed some mild fatigue but attributes this to her busy lifestyle.  She occasionally will have sharp shooting pains within the left breast.  She denies any itching within the breast area.  Review of patient's allergies indicates no known allergies.  Current Outpatient Prescriptions  Medication Sig Dispense Refill  . ACCU-CHEK AVIVA PLUS test strip       . aspirin EC 81 MG tablet Take 81 mg by mouth daily.      . celecoxib (CELEBREX) 200 MG capsule Take 1 capsule (200 mg total) by mouth daily. For pain  30 capsule  0  . Cholecalciferol (VITAMIN D-3) 5000 UNITS TABS Take 5,000 Units by mouth daily.      . diclofenac sodium (VOLTAREN) 1 % GEL Apply topically as needed.      . hyaluronate sodium (RADIAPLEXRX) GEL Apply 1 application topically 2 (two) times daily.      Marland Kitchen HYDROcodone-acetaminophen (NORCO/VICODIN) 5-325 MG per tablet Take 1-2 tablets by mouth every 6 (six) hours as needed for pain.  30 tablet  1  . levothyroxine (SYNTHROID, LEVOTHROID) 200 MCG tablet Take 200 mcg by mouth daily.      Marland Kitchen lisinopril (PRINIVIL,ZESTRIL) 10 MG tablet Take 10 mg by mouth daily.       . meloxicam (MOBIC) 15 MG tablet Take 15 mg by mouth as needed.      . metFORMIN (GLUCOPHAGE) 1000 MG tablet Take 500-1,000 mg by mouth 2 (two) times daily with a meal. 500mg  in the morning and 1000mg  at night      . non-metallic deodorant (ALRA) MISC Apply 1 application topically daily as needed.      Marland Kitchen oxyCODONE (OXY IR/ROXICODONE) 5 MG immediate release tablet Take 1-2 tablets (5-10 mg total) by mouth every 3 (three) hours as needed for pain.  100 tablet  0  . simvastatin (ZOCOR) 40 MG tablet Take 40 mg by mouth daily.      Marland Kitchen zolpidem (AMBIEN) 10 MG tablet Take 1 tablet (10 mg total) by mouth at bedtime as needed for sleep. For sleep  30 tablet  0   No current facility-administered medications for this encounter.   Labs:  Lab Results  Component Value Date   WBC 4.6 12/20/2012   HGB 13.4 12/28/2012   HCT 39.0 12/20/2012   MCV 83.0 12/20/2012   PLT 191 12/20/2012   Lab Results  Component Value Date   CREATININE 0.9 12/20/2012   BUN 22.2 12/20/2012   NA 142 12/20/2012   K 4.2 12/20/2012   CL 102 04/12/2012   CO2 31* 12/20/2012   Lab Results  Component Value Date   ALT 8 12/20/2012   AST 13 12/20/2012   BILITOT 1.08 12/20/2012    Physical Examination:  height is 5\' 6"  (1.676 m) and weight is 281 lb 6.4 oz (127.642 kg). Her temperature is 98.3 F (36.8 C). Her blood pressure is 126/71 and her pulse is 75.    Wt Readings from Last 3 Encounters:  02/13/13 281 lb 6.4 oz (127.642 kg)  02/05/13 281 lb 1.6 oz (127.506 kg)  01/24/13 283 lb (128.368 kg)    The left breast area shows some mild hyperpigmentation changes. Lungs - Normal respiratory effort, chest expands symmetrically. Lungs are clear to auscultation, no crackles or wheezes.  Heart has regular rhythm and rate  Abdomen is soft and non tender with normal bowel sounds  Assessment:  Patient tolerating treatments well  Plan: Continue treatment per original radiation prescription

## 2013-02-14 ENCOUNTER — Ambulatory Visit
Admission: RE | Admit: 2013-02-14 | Discharge: 2013-02-14 | Disposition: A | Discharge status: Home / Self Care | Payer: Medicare Other | Source: Ambulatory Visit | Attending: Radiation Oncology | Admitting: Radiation Oncology

## 2013-02-15 ENCOUNTER — Ambulatory Visit: Payer: Medicare Other

## 2013-02-16 ENCOUNTER — Ambulatory Visit: Payer: Medicare Other

## 2013-02-19 ENCOUNTER — Ambulatory Visit
Admission: RE | Admit: 2013-02-19 | Discharge: 2013-02-19 | Disposition: A | Discharge status: Home / Self Care | Payer: Medicare Other | Source: Ambulatory Visit | Attending: Radiation Oncology | Admitting: Radiation Oncology

## 2013-02-19 ENCOUNTER — Encounter: Payer: Self-pay | Admitting: *Deleted

## 2013-02-19 NOTE — Progress Notes (Signed)
Mailed after appt letter to pt. 

## 2013-02-20 ENCOUNTER — Ambulatory Visit
Admission: RE | Admit: 2013-02-20 | Discharge: 2013-02-20 | Disposition: A | Discharge status: Home / Self Care | Payer: Medicare Other | Source: Ambulatory Visit | Attending: Radiation Oncology | Admitting: Radiation Oncology

## 2013-02-20 VITALS — BP 142/67 | HR 75 | Temp 97.9°F | Ht 66.0 in | Wt 282.7 lb

## 2013-02-20 DIAGNOSIS — C50412 Malignant neoplasm of upper-outer quadrant of left female breast: Secondary | ICD-10-CM

## 2013-02-20 NOTE — Progress Notes (Signed)
[facility]    Radiation Oncology [address] [place]     Maryln Gottron, M.D. [place], [place] [address]               Billie Lade, M.D., Ph.D. Phone: [phone]      Molli Hazard A. Kathrynn Running, M.D. Fax: [phone]      Radene Gunning, M.D., Ph.D.         Lurline Hare, M.D.         [place] Jack, M.D Weekly Treatment Management Note  Name: [name]     MRN: [id number]        CSN: [id number] Date: 02/20/2013      DOB: 07-23-1945  CC: Jacqueline Dimitri, MD         Corliss Blacker    Status: Outpatient  Diagnosis: The encounter diagnosis was Breast cancer of upper-outer quadrant of left female breast.  Current Dose: 21.6 Gy  Current Fraction: 12  Planned Dose: 60.4 Gy  Narrative: [name] was seen today for weekly treatment management. The chart was checked and port films  were reviewed. She is tolerating the treatments well at this time. 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  She denies any itching or discomfort within the breast area.  Review of patient's allergies indicates no known allergies.  Physical Examination:  Filed Vitals:   02/20/13 1115  BP: 142/67  Pulse: 75  Temp: 97.9 F (36.6 C)    Wt Readings from Last 3 Encounters:  02/20/13 282 lb 11.2 oz (128.232 kg)  02/13/13 281 lb 6.4 oz (127.642 kg)  02/05/13 281 lb 1.6 oz (127.506 kg)    The left breast area shows some hyperpigmentation changes but no desquamation. Lungs - Normal respiratory effort, chest expands symmetrically. Lungs are clear to auscultation, no crackles or wheezes.  Heart has regular rhythm and rate  Abdomen is soft and non tender with normal bowel sounds  Assessment:  Patient tolerating treatments well  Plan: Continue treatment per original radiation prescription

## 2013-02-20 NOTE — Progress Notes (Signed)
[name] here for weekly under treat visit.  She denies pain.  She does have fatigue.  The skin on her left breast is intact with hyperpigmentation.  She also has some redness underneath her breast.  She is using radiaplex gel.

## 2013-02-21 ENCOUNTER — Ambulatory Visit
Admission: RE | Admit: 2013-02-21 | Discharge: 2013-02-21 | Disposition: A | Discharge status: Home / Self Care | Payer: Medicare Other | Source: Ambulatory Visit | Attending: Radiation Oncology | Admitting: Radiation Oncology

## 2013-02-22 ENCOUNTER — Ambulatory Visit
Admission: RE | Admit: 2013-02-22 | Discharge: 2013-02-22 | Disposition: A | Discharge status: Home / Self Care | Payer: Medicare Other | Source: Ambulatory Visit | Attending: Radiation Oncology | Admitting: Radiation Oncology

## 2013-02-23 ENCOUNTER — Ambulatory Visit
Admission: RE | Admit: 2013-02-23 | Discharge: 2013-02-23 | Disposition: A | Discharge status: Home / Self Care | Payer: Medicare Other | Source: Ambulatory Visit | Attending: Radiation Oncology | Admitting: Radiation Oncology

## 2013-02-23 DIAGNOSIS — C50412 Malignant neoplasm of upper-outer quadrant of left female breast: Secondary | ICD-10-CM

## 2013-02-23 MED ORDER — RADIAPLEXRX EX GEL
Freq: Once | CUTANEOUS | Status: AC
  Administered 2013-02-23: 11:00:00 via TOPICAL

## 2013-02-23 MED ORDER — RADIAPLEXRX EX GEL: Freq: Once | CUTANEOUS | Status: DC

## 2013-02-23 NOTE — Progress Notes (Signed)
[name] is requesting another tube of radiaplex.  Another tube given.

## 2013-02-25 ENCOUNTER — Ambulatory Visit
Admission: RE | Admit: 2013-02-25 | Discharge: 2013-02-25 | Disposition: A | Discharge status: Home / Self Care | Payer: Medicare Other | Source: Ambulatory Visit | Attending: Radiation Oncology | Admitting: Radiation Oncology

## 2013-02-26 ENCOUNTER — Ambulatory Visit
Admission: RE | Admit: 2013-02-26 | Discharge: 2013-02-26 | Disposition: A | Discharge status: Home / Self Care | Payer: Medicare Other | Source: Ambulatory Visit | Attending: Radiation Oncology | Admitting: Radiation Oncology

## 2013-02-26 DIAGNOSIS — C50912 Malignant neoplasm of unspecified site of left female breast: Secondary | ICD-10-CM

## 2013-02-27 ENCOUNTER — Ambulatory Visit
Admission: RE | Admit: 2013-02-27 | Discharge: 2013-02-27 | Disposition: A | Discharge status: Home / Self Care | Payer: Medicare Other | Source: Ambulatory Visit | Attending: Radiation Oncology | Admitting: Radiation Oncology

## 2013-02-27 VITALS — BP 125/65 | HR 75 | Temp 98.3°F | Ht 66.0 in | Wt 281.3 lb

## 2013-02-27 DIAGNOSIS — C50412 Malignant neoplasm of upper-outer quadrant of left female breast: Secondary | ICD-10-CM

## 2013-02-27 NOTE — Progress Notes (Signed)
[facility]    Radiation Oncology [address] [place]     Maryln Gottron, M.D. [place], [place] [address]               Billie Lade, M.D., Ph.D. Phone: [phone]      Molli Hazard A. Kathrynn Running, M.D. Fax: [phone]      Radene Gunning, M.D., Ph.D.         Lurline Hare, M.D.         Grayland Jack, M.D Weekly Treatment Management Note  Name: [name]     MRN: [id number]        CSN: [id number] Date: 02/27/2013      DOB: May 11, 1945  CC: [name], MD         Corliss Blacker    Status: Outpatient  Diagnosis: The encounter diagnosis was Breast cancer of upper-outer quadrant of left female breast.  Current Dose: 32.4 Gy  Current Fraction: 18  Planned Dose: 60.4 Gy  Narrative: [name] was seen today for weekly treatment management. The chart was checked and port films  were reviewed. She is having some fatigue. She has minimal discomfort in the breast area.  Review of patient's allergies indicates no known allergies. Current Outpatient Prescriptions  Medication Sig Dispense Refill  . ACCU-CHEK AVIVA PLUS test strip       . aspirin EC 81 MG tablet Take 81 mg by mouth daily.      . celecoxib (CELEBREX) 200 MG capsule Take 1 capsule (200 mg total) by mouth daily. For pain  30 capsule  0  . Cholecalciferol (VITAMIN D-3) 5000 UNITS TABS Take 5,000 Units by mouth daily.      . diclofenac sodium (VOLTAREN) 1 % GEL Apply topically as needed.      . hyaluronate sodium (RADIAPLEXRX) GEL Apply 1 application topically 2 (two) times daily.      Marland Kitchen levothyroxine (SYNTHROID, LEVOTHROID) 200 MCG tablet Take 200 mcg by mouth daily.      Marland Kitchen lisinopril (PRINIVIL,ZESTRIL) 10 MG tablet Take 10 mg by mouth daily.      . meloxicam (MOBIC) 15 MG tablet Take 15 mg by mouth as needed.      . metFORMIN (GLUCOPHAGE) 1000 MG tablet Take 500-1,000 mg by mouth 2 (two) times daily with a meal. 500mg  in the morning and 1000mg  at night      . non-metallic deodorant (ALRA) MISC Apply 1  application topically daily as needed.      . simvastatin (ZOCOR) 40 MG tablet Take 40 mg by mouth daily.      Marland Kitchen zolpidem (AMBIEN) 10 MG tablet Take 1 tablet (10 mg total) by mouth at bedtime as needed for sleep. For sleep  30 tablet  0  . HYDROcodone-acetaminophen (NORCO/VICODIN) 5-325 MG per tablet Take 1-2 tablets by mouth every 6 (six) hours as needed for pain.  30 tablet  1  . oxyCODONE (OXY IR/ROXICODONE) 5 MG immediate release tablet Take 1-2 tablets (5-10 mg total) by mouth every 3 (three) hours as needed for pain.  100 tablet  0   No current facility-administered medications for this encounter.     Physical Examination:  Filed Vitals:   02/27/13 1120  BP: 125/65  Pulse: 75  Temp: 98.3 F (36.8 C)    Wt Readings from Last 3 Encounters:  02/27/13 281 lb 4.8 oz (127.597 kg)  02/20/13 282 lb 11.2 oz (128.232 kg)  02/13/13 281 lb 6.4 oz (127.642 kg)    The  left breast area shows hyperpigmentation changes and erythema. No moist desquamation. Lungs - Normal respiratory effort, chest expands symmetrically. Lungs are clear to auscultation, no crackles or wheezes.  Heart has regular rhythm and rate  Abdomen is soft and non tender with normal bowel sounds  Assessment:  Patient tolerating treatments well  Plan: Continue treatment per original radiation prescription

## 2013-02-27 NOTE — Progress Notes (Signed)
[name] here for weekly under treat visit.  She has had 16 fractions to her left breast.  She denies pain.  She has occasional fatigue.  The skin underneath her left breast has hyperpigmentation/redness and her left axilla has hyperpigmentation.  She is using radiaplex three times a day.

## 2013-02-28 ENCOUNTER — Ambulatory Visit
Admission: RE | Admit: 2013-02-28 | Discharge: 2013-02-28 | Disposition: A | Discharge status: Home / Self Care | Payer: Medicare Other | Source: Ambulatory Visit | Attending: Radiation Oncology | Admitting: Radiation Oncology

## 2013-03-04 ENCOUNTER — Ambulatory Visit: Payer: Medicare Other

## 2013-03-05 ENCOUNTER — Ambulatory Visit
Admission: RE | Admit: 2013-03-05 | Discharge: 2013-03-05 | Disposition: A | Discharge status: Home / Self Care | Payer: Medicare Other | Source: Ambulatory Visit | Attending: Radiation Oncology | Admitting: Radiation Oncology

## 2013-03-06 ENCOUNTER — Ambulatory Visit
Admission: RE | Admit: 2013-03-06 | Discharge: 2013-03-06 | Disposition: A | Discharge status: Home / Self Care | Payer: Medicare Other | Source: Ambulatory Visit | Attending: Radiation Oncology | Admitting: Radiation Oncology

## 2013-03-06 ENCOUNTER — Ambulatory Visit (HOSPITAL_BASED_OUTPATIENT_CLINIC_OR_DEPARTMENT_OTHER): Payer: Medicare Other | Admitting: Oncology

## 2013-03-06 ENCOUNTER — Encounter: Payer: Self-pay | Admitting: Radiation Oncology

## 2013-03-06 VITALS — BP 144/83 | HR 79 | Temp 97.9°F | Resp 18 | Ht 66.0 in | Wt 281.9 lb

## 2013-03-06 DIAGNOSIS — M899 Disorder of bone, unspecified: Secondary | ICD-10-CM

## 2013-03-06 DIAGNOSIS — C50412 Malignant neoplasm of upper-outer quadrant of left female breast: Secondary | ICD-10-CM

## 2013-03-06 DIAGNOSIS — D0590 Unspecified type of carcinoma in situ of unspecified breast: Secondary | ICD-10-CM

## 2013-03-06 DIAGNOSIS — Z170 Estrogen receptor positive status [ER+]: Secondary | ICD-10-CM

## 2013-03-06 MED ORDER — HYDROCODONE-ACETAMINOPHEN 5-325 MG PO TABS: 1.0000 | ORAL_TABLET | Freq: Four times a day (QID) | ORAL | Status: DC | PRN

## 2013-03-06 NOTE — Progress Notes (Signed)
Patient ID: [name], female   DOB: 03-19-1946, 67 y.o.   MRN: [id number] ID: [name] OB: November 21, 1945  MR#: [id number]  CSN#:[id number]  PCP: [name], MD GYN:   SU: Ovidio Kin OTHER MD: Antony Blackbird  CHIEF COMPLAINT: New diagnosis of breast cancer  HISTORY OF PRESENT ILLNESS: [name] had routine screening mammography at the [facility] in [place] 11/27/2012 showing new calcifications in the left breast upper outer quadrant. Additional views 11/28/2012 showed a 1.4 cm cluster of pleomorphic calcifications, for which biopsy was recommended. 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  Bilateral breast MRI 12/19/2012 showed only post biopsy change.  The patient's subsequent history is as detailed below  INTERVAL HISTORY: [name] returns today for followup of her breast cancer accompanied by her husband [name]. Since her last visit here she has started radiation therapy, initially under Dr. Basilio Cairo, then Dr. Roselind Messier. 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 Aside from the mild side effects from the radiation, a detailed review of systems today was entirely Montgomery  PAST MEDICAL HISTORY: Past Medical History  Diagnosis Date  . Right knee DJD   . Diabetes   . History of pulmonary embolism 2007    x 9  . Hypertension   . High cholesterol   . Pulmonary embolism   . Pneumonia   . Bell's palsy     Resolved  . 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 Subcutaneous hematoma left leg just outside knee joint 04/10/2012    Culture was done of this fluid collection is pending  Gram stain had white cells but no organism  . Breast cancer 12/13/2012    left uoq- DCIS  . Thyroid disease    . Wears glasses   . Wears partial dentures     upper partial  . Sleep apnea     uses a cpap  . PONV (postoperative nausea and vomiting)     still got sick 1/14-may need scop patch  . Edema leg     PAST SURGICAL HISTORY: Past Surgical History  Procedure Laterality Date  . Knee arthroplasty  2007    left  . Abdominal hysterectomy  1981    with unilateral slapingo-oophorectomy  . Thyroidectomy  2010  . Total knee arthroplasty  04/10/2012    Procedure: TOTAL KNEE ARTHROPLASTY;  Surgeon: Nilda Simmer, MD;  Location: [facility] OR;  Service: Orthopedics;  Laterality: Right;  . Breast lumpectomy with needle localization Left 12/28/2012    Procedure: BREAST LUMPECTOMY WITH NEEDLE LOCALIZATION;  Surgeon: Kandis Cocking, MD;  Location: [facility];  Service: General;  Laterality: Left;    FAMILY HISTORY Family History  Problem Relation Age of Onset  . Hypertension Mother   . Heart attack Mother   . Heart disease Mother     before age 61  . Other Mother     varicose veins  . Cancer Father     kidney  . Diabetes Father   . Heart disease Father   . Hyperlipidemia Father   . Hypertension Father   . Diabetes Son   . Hyperlipidemia Son   . Hypertension Son   . Other Son     varicose veins  . Breast cancer Paternal  Aunt    the patient's father died from a heart attack at age 70. The patient's mother died in an automobile accident at age 43. The patient had one brother and one sister. [name] did have renal cell carcinoma diagnosed at age 59, cured with surgery. There is no history of breast or ovarian cancer in the family to the patient's knowledge.  GYNECOLOGIC HISTORY:  Menarche age 13, first live birth age 69, the patient is GX P4. She underwent hysterectomy with unilateral salpingo-oophorectomy in 1981. She did not take hormone replacement.  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 Her husband [name] (goes by [employer]") was a  Land. Both Bill and [name] are now [employer]. They exhibit locally. Their son [name] lives in [place] and works in Production designer, theatre/television/film for the city of [place]. Son [name] lives in [place] and works for [employer] long care. Son [name] lives in [place] as a Production designer, theatre/television/film for [employer]. Son [name] also lives in [place] and is a Presenter, broadcasting. The patient has 11 grandchildren. She attends a [employer] in [place].    ADVANCED DIRECTIVES: In place   HEALTH MAINTENANCE: History  Substance Use Topics  . Smoking status: Never Smoker   . Smokeless tobacco: Never Used  . Alcohol Use: No     Colonoscopy: 2012/ [facility]  PAP: 2009  Bone density:  Lipid panel:  No Known Allergies  Current Outpatient Prescriptions  Medication Sig Dispense Refill  . ACCU-CHEK AVIVA PLUS test strip       . aspirin EC 81 MG tablet Take 81 mg by mouth daily.      . celecoxib (CELEBREX) 200 MG capsule Take 1 capsule (200 mg total) by mouth daily. For pain  30 capsule  0  . Cholecalciferol (VITAMIN D-3) 5000 UNITS TABS Take 5,000 Units by mouth daily.      . diclofenac sodium (VOLTAREN) 1 % GEL Apply topically as needed.      . hyaluronate sodium (RADIAPLEXRX) GEL Apply 1 application topically 2 (two) times daily.      Marland Kitchen HYDROcodone-acetaminophen (NORCO/VICODIN) 5-325 MG per tablet Take 1-2 tablets by mouth every 6 (six) hours as needed for pain.  30 tablet  1  . levothyroxine (SYNTHROID, LEVOTHROID) 200 MCG tablet Take 200 mcg by mouth daily.      Marland Kitchen lisinopril (PRINIVIL,ZESTRIL) 10 MG tablet Take 10 mg by mouth daily.      . meloxicam (MOBIC) 15 MG tablet Take 15 mg by mouth as needed.      . metFORMIN (GLUCOPHAGE) 1000 MG tablet Take 500-1,000 mg by mouth 2 (two) times daily with a meal. 500mg  in the morning and 1000mg  at night      . non-metallic deodorant (ALRA) MISC Apply 1 application topically daily as needed.      Marland Kitchen oxyCODONE (OXY IR/ROXICODONE) 5 MG immediate release tablet Take 1-2 tablets (5-10  mg total) by mouth every 3 (three) hours as needed for pain.  100 tablet  0  . simvastatin (ZOCOR) 40 MG tablet Take 40 mg by mouth daily.      Marland Kitchen zolpidem (AMBIEN) 10 MG tablet Take 1 tablet (10 mg total) by mouth at bedtime as needed for sleep. For sleep  30 tablet  0   No current facility-administered medications for this visit.    OBJECTIVE: Middle-aged white Montgomery in no acute distress Filed Vitals:   03/06/13 1557  BP: 144/83  Pulse: 79  Temp: 97.9 F (36.6 C)  Resp: 18     Body mass index is 45.52 kg/(m^2).    ECOG FS:1 - Symptomatic but completely ambulatory  Sclerae unicteric, pupils equal and round Oropharynx clear and moist-- no thrush or other lesions No cervical or supraclavicular adenopathy Lungs no rales or rhonchi Heart regular rate and rhythm Abd soft, obese, nontender, positive bowel sounds MSK no focal spinal tenderness, no upper extremity lymphedema Neuro: nonfocal, well oriented, appropriate affect Breasts: The right breast is unremarkable; the left breast is status post lumpectomy and is currently undergoing radiation, with erythema but no desquamation noted. There is no evidence of local recurrence or progression. The left axilla is benign  LAB RESULTS:  CMP     Component Value Date/Time   NA 142 12/20/2012 0845   NA 137 04/12/2012 0600   K 4.2 12/20/2012 0845   K 4.5 04/12/2012 0600   CL 102 04/12/2012 0600   CO2 31* 12/20/2012 0845   CO2 28 04/12/2012 0600   GLUCOSE 137 12/20/2012 0845   GLUCOSE 192* 04/12/2012 0600   BUN 22.2 12/20/2012 0845   BUN 20 04/12/2012 0600   CREATININE 0.9 12/20/2012 0845   CREATININE 0.93 11/29/2012 0815   CALCIUM 9.3 12/20/2012 0845   CALCIUM 8.7 04/12/2012 0600   PROT 6.9 12/20/2012 0845   PROT 7.2 03/31/2012 1428   ALBUMIN 3.6 12/20/2012 0845   ALBUMIN 3.7 03/31/2012 1428   AST 13 12/20/2012 0845   AST 16 03/31/2012 1428   ALT 8 12/20/2012 0845   ALT 14 03/31/2012 1428   ALKPHOS 69 12/20/2012 0845   ALKPHOS 76 03/31/2012 1428    BILITOT 1.08 12/20/2012 0845   BILITOT 0.5 03/31/2012 1428   GFRNONAA 63* 11/29/2012 0815   GFRAA 73* 11/29/2012 0815    I No results found for this basename: SPEP,  UPEP,   kappa and lambda light chains    Lab Results  Component Value Date   WBC 4.6 12/20/2012   NEUTROABS 2.8 12/20/2012   HGB 13.4 12/28/2012   HCT 39.0 12/20/2012   MCV 83.0 12/20/2012   PLT 191 12/20/2012      Chemistry      Component Value Date/Time   NA 142 12/20/2012 0845   NA 137 04/12/2012 0600   K 4.2 12/20/2012 0845   K 4.5 04/12/2012 0600   CL 102 04/12/2012 0600   CO2 31* 12/20/2012 0845   CO2 28 04/12/2012 0600   BUN 22.2 12/20/2012 0845   BUN 20 04/12/2012 0600   CREATININE 0.9 12/20/2012 0845   CREATININE 0.93 11/29/2012 0815      Component Value Date/Time   CALCIUM 9.3 12/20/2012 0845   CALCIUM 8.7 04/12/2012 0600   ALKPHOS 69 12/20/2012 0845   ALKPHOS 76 03/31/2012 1428   AST 13 12/20/2012 0845   AST 16 03/31/2012 1428   ALT 8 12/20/2012 0845   ALT 14 03/31/2012 1428   BILITOT 1.08 12/20/2012 0845   BILITOT 0.5 03/31/2012 1428       No results found for this basename: LABCA2    No components found with this basename: LABCA125    No results found for this basename: INR,  in the last 168 hours  Urinalysis    Component Value Date/Time   COLORURINE YELLOW 04/10/2012 1136   APPEARANCEUR CLEAR 04/10/2012 1136   LABSPEC 1.025 04/10/2012 1136   PHURINE 6.0 04/10/2012 1136   GLUCOSEU NEGATIVE 04/10/2012 1136   HGBUR NEGATIVE 04/10/2012 1136   BILIRUBINUR NEGATIVE 04/10/2012 1136   KETONESUR NEGATIVE  04/10/2012 1136   PROTEINUR NEGATIVE 04/10/2012 1136   UROBILINOGEN 2.0* 04/10/2012 1136   NITRITE NEGATIVE 04/10/2012 1136   LEUKOCYTESUR NEGATIVE 04/10/2012 1136    STUDIES: No results found.  ASSESSMENT: 67 y.o. [name], [name] status post left breast biopsy 12/13/2012 for a ductal carcinoma in situ, grade 1, estrogen receptor 100% positive, progesterone receptor 44% positive  (1) status post left lumpectomy 12/28/2012  showing a 9 mm area of ductal carcinoma in situ, low-grade, with close but negative margins.  (2) adjuvant radiation to be completed 03/22/2013  (3) to start anastrozole January 2015  (4) Mild osteopenia, with bone density 01/26/2013 showing a T score of -1.1   PLAN: [name] understands her cancer was noninvasive and therefore not in itself life-threatening. The benefit of antiestrogen syndromes of reducing local recurrence would be small. However she also has an approximately 1% per year chance of developing another breast cancer in either breast. Anti-estrogens would essentially cut that risk in half.  We then discussed the difference between tamoxifen and anastrozole, where this special reference to the possible toxicities, side effects, and complications. After much discussion we decided to go with anastrozole, since she has a good bone density to start with and I would be more concerned regarding issues of clotting in this Montgomery who is generally sedentary.  Accordingly she will start anastrozole once she recovers sufficiently from radiation. I would think that would be sometime in early to mid January. She will see me again in April and if she is tolerating the treatment well at that time we will start seeing her on a once a year basis until she completes 5 years of followup.  [name] has a good understanding of this plan. She is in agreement with that. She knows to call for any problems that may develop before next visit here.   Jacqueline Dell, MD   03/06/2013 3:59 PM

## 2013-03-06 NOTE — Progress Notes (Signed)
[facility]    Radiation Oncology [address] [place]     Maryln Gottron, M.D. [place], [place] [address]               Billie Lade, M.D., Ph.D. Phone: [phone]      Molli Hazard A. Kathrynn Running, M.D. Fax: [phone]      Radene Gunning, M.D., Ph.D.         Lurline Hare, M.D.         Grayland Jack, M.D Weekly Treatment Management Note  Name: [name]     MRN: [id number]        CSN: [id number] Date: 03/06/2013      DOB: Jun 21, 1945  CC: [name], MD         Corliss Blacker    Status: Outpatient  Diagnosis: Left breast cancer  Current Dose: 37.8 Gy  Current Fraction: 21  Planned Dose: 60.4 Gy  Narrative: [name] was seen today for weekly treatment management. The chart was checked and port films  were reviewed. She is having pain in the breast area at this time. She has been using Aleve with minimal benefit. I have given the patient a limited prescription for Vicodin in light of this issue. She has been using Neosporin but denies any active drainage from the breast. She's also been using radiaplex. patient is also being given a tube of Biafine today.  She does have some fatigue.  Review of patient's allergies indicates no known allergies. Current Outpatient Prescriptions  Medication Sig Dispense Refill  . ACCU-CHEK AVIVA PLUS test strip       . aspirin EC 81 MG tablet Take 81 mg by mouth daily.      . celecoxib (CELEBREX) 200 MG capsule Take 1 capsule (200 mg total) by mouth daily. For pain  30 capsule  0  . Cholecalciferol (VITAMIN D-3) 5000 UNITS TABS Take 5,000 Units by mouth daily.      . diclofenac sodium (VOLTAREN) 1 % GEL Apply topically as needed.      . hyaluronate sodium (RADIAPLEXRX) GEL Apply 1 application topically 2 (two) times daily.      Marland Kitchen levothyroxine (SYNTHROID, LEVOTHROID) 200 MCG tablet Take 200 mcg by mouth daily.      Marland Kitchen lisinopril (PRINIVIL,ZESTRIL) 10 MG tablet Take 10 mg by mouth daily.      . metFORMIN (GLUCOPHAGE) 1000 MG  tablet Take 500-1,000 mg by mouth 2 (two) times daily with a meal. 500mg  in the morning and 1000mg  at night      . non-metallic deodorant (ALRA) MISC Apply 1 application topically daily as needed.      . simvastatin (ZOCOR) 40 MG tablet Take 40 mg by mouth daily.      Marland Kitchen zolpidem (AMBIEN) 10 MG tablet Take 1 tablet (10 mg total) by mouth at bedtime as needed for sleep. For sleep  30 tablet  0  . HYDROcodone-acetaminophen (NORCO/VICODIN) 5-325 MG per tablet Take 1-2 tablets by mouth every 6 (six) hours as needed.  30 tablet  0  . meloxicam (MOBIC) 15 MG tablet Take 15 mg by mouth as needed.      Marland Kitchen oxyCODONE (OXY IR/ROXICODONE) 5 MG immediate release tablet Take 1-2 tablets (5-10 mg total) by mouth every 3 (three) hours as needed for pain.  100 tablet  0   No current facility-administered medications for this encounter.    Physical Examination:  There were no vitals filed for this visit.  Wt Readings from Last  3 Encounters:  03/06/13 281 lb 14.4 oz (127.869 kg)  02/27/13 281 lb 4.8 oz (127.597 kg)  02/20/13 282 lb 11.2 oz (128.232 kg)    The left breast area shows hyperpigmentation changes and erythema but no moist desquamation. Lungs - Normal respiratory effort, chest expands symmetrically. Lungs are clear to auscultation, no crackles or wheezes.  Heart has regular rhythm and rate  Abdomen is soft and non tender with normal bowel sounds  Assessment:  Patient tolerating treatments well  Plan: Continue treatment per original radiation prescription

## 2013-03-06 NOTE — Progress Notes (Signed)
   Department of Radiation Oncology  Phone:  [phone] Fax:        [phone]  Simulation note  Today the patient underwent additional planning for radiation therapy directed at the left breast area. Patient's treatment planning CT scan was reviewed and she had set up of a custom photon boost directed at the lumpectomy cavity within the breast. The patient will be treated with an AP and left posterior oblique fields. 10 MV photons will be used to deliver the patient's treatment. Plan is for the patient to receive 10 gray with her boost field in 5 fractions. A computerized isodose plan is requested for treatment.  Billie Lade,  M.D. PhD

## 2013-03-06 NOTE — Progress Notes (Signed)
[name] here for weekly under treat visit.  She has had 21 fractions to her left breast.  She is having pain in her left breast that she is rating at a 7/10.  The skin on underneath her left arm and left breast has redness and hyperpigmentation.  She is alternating using radiaplex gel and neosporin.  Her vitals were done in medical oncology and were 144/83, hr 79 and temp of 97.9.

## 2013-03-07 ENCOUNTER — Telehealth: Payer: Self-pay | Admitting: Oncology

## 2013-03-07 ENCOUNTER — Ambulatory Visit
Admission: RE | Admit: 2013-03-07 | Discharge: 2013-03-07 | Disposition: A | Discharge status: Home / Self Care | Payer: Medicare Other | Source: Ambulatory Visit | Attending: Radiation Oncology | Admitting: Radiation Oncology

## 2013-03-07 MED ORDER — ANASTROZOLE 1 MG PO TABS: 1.0000 mg | ORAL_TABLET | Freq: Every day | ORAL | Status: DC

## 2013-03-07 NOTE — Telephone Encounter (Signed)
per 12/2 POF appt made and APRIL 2015 cal mailed to pt shh

## 2013-03-08 ENCOUNTER — Ambulatory Visit
Admission: RE | Admit: 2013-03-08 | Discharge: 2013-03-08 | Disposition: A | Discharge status: Home / Self Care | Payer: Medicare Other | Source: Ambulatory Visit | Attending: Radiation Oncology | Admitting: Radiation Oncology

## 2013-03-09 ENCOUNTER — Ambulatory Visit: Payer: Medicare Other

## 2013-03-12 ENCOUNTER — Ambulatory Visit
Admission: RE | Admit: 2013-03-12 | Discharge: 2013-03-12 | Disposition: A | Discharge status: Home / Self Care | Payer: Medicare Other | Source: Ambulatory Visit | Attending: Radiation Oncology | Admitting: Radiation Oncology

## 2013-03-13 ENCOUNTER — Ambulatory Visit
Admission: RE | Admit: 2013-03-13 | Discharge: 2013-03-13 | Disposition: A | Discharge status: Home / Self Care | Payer: Medicare Other | Source: Ambulatory Visit | Attending: Radiation Oncology | Admitting: Radiation Oncology

## 2013-03-13 VITALS — BP 121/74 | HR 76 | Temp 97.8°F | Ht 66.0 in | Wt 280.9 lb

## 2013-03-13 DIAGNOSIS — C50412 Malignant neoplasm of upper-outer quadrant of left female breast: Secondary | ICD-10-CM

## 2013-03-13 MED ORDER — BIAFINE EX EMUL
Freq: Two times a day (BID) | CUTANEOUS | Status: DC
  Administered 2013-03-13: 12:00:00 via TOPICAL

## 2013-03-13 NOTE — Addendum Note (Signed)
Encounter addended by: Eduardo Osier, RN on: 03/13/2013 11:57 AM<BR>     Documentation filed: Inpatient MAR

## 2013-03-13 NOTE — Progress Notes (Signed)
[name] has had 25 fractions to her left breast.  She denies pain.  She has only had to take the norco once last week.  She is fatigued.  The skin on her left breast and left underarm has hyperpigmentation and redness.  She has some peeling underneath her breast.  She is using biafine gel.  She is requesting another tube.  A second tube has been given.

## 2013-03-13 NOTE — Progress Notes (Signed)
[facility]    Radiation Oncology [address] [place]     Maryln Gottron, M.D. [place], [place] [address]               Billie Lade, M.D., Ph.D. Phone: [phone]      Molli Hazard A. Kathrynn Running, M.D. Fax: [phone]      Radene Gunning, M.D., Ph.D.         Lurline Hare, M.D.         Grayland Jack, M.D Weekly Treatment Management Note  Name: [name]     MRN: [id number]        CSN: [id number] Date: 03/13/2013      DOB: Apr 29, 1945  CC: Jacqueline Dimitri, MD         [name]    Status: Outpatient  Diagnosis: The encounter diagnosis was Breast cancer of upper-outer quadrant of left female breast.  Current Dose: 45 Gy  Current Fraction: 25  Planned Dose: 60.4 Gy  Narrative: [name] was seen today for weekly treatment management. The chart was checked and port films  were reviewed. 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  She has noticed some discomfort in the breast area but Biafine seems to be helping significantly. sHe denies any drainage from the breast area.  Review of patient's allergies indicates no known allergies. Current Outpatient Prescriptions  Medication Sig Dispense Refill  . ACCU-CHEK AVIVA PLUS test strip       . aspirin EC 81 MG tablet Take 81 mg by mouth daily.      . celecoxib (CELEBREX) 200 MG capsule Take 1 capsule (200 mg total) by mouth daily. For pain  30 capsule  0  . Cholecalciferol (VITAMIN D-3) 5000 UNITS TABS Take 5,000 Units by mouth daily.      . diclofenac sodium (VOLTAREN) 1 % GEL Apply topically as needed.      Marland Kitchen emollient (BIAFINE) cream Apply topically 2 (two) times daily.      Marland Kitchen HYDROcodone-acetaminophen (NORCO/VICODIN) 5-325 MG per tablet Take 1-2 tablets by mouth every 6 (six) hours as needed.  30 tablet  0  . levothyroxine (SYNTHROID, LEVOTHROID) 200 MCG tablet Take 200 mcg by mouth daily.      Marland Kitchen lisinopril (PRINIVIL,ZESTRIL) 10 MG tablet Take 10  mg by mouth daily.      . metFORMIN (GLUCOPHAGE) 1000 MG tablet Take 500-1,000 mg by mouth 2 (two) times daily with a meal. 500mg  in the morning and 1000mg  at night      . non-metallic deodorant (ALRA) MISC Apply 1 application topically daily as needed.      . simvastatin (ZOCOR) 40 MG tablet Take 40 mg by mouth daily.      Marland Kitchen zolpidem (AMBIEN) 10 MG tablet Take 1 tablet (10 mg total) by mouth at bedtime as needed for sleep. For sleep  30 tablet  0  . anastrozole (ARIMIDEX) 1 MG tablet Take 1 tablet (1 mg total) by mouth daily.  90 tablet  4  . hyaluronate sodium (RADIAPLEXRX) GEL Apply 1 application topically 2 (two) times daily.      . meloxicam (MOBIC) 15 MG tablet Take 15 mg by mouth as needed.      Marland Kitchen oxyCODONE (OXY IR/ROXICODONE) 5 MG immediate release tablet Take 1-2 tablets (5-10 mg total) by mouth every 3 (three) hours as needed for pain.  100 tablet  0   Current Facility-Administered Medications  Medication Dose Route Frequency Provider Last Rate Last Dose  . topical emolient (BIAFINE) emulsion   Topical BID Billie Lade, MD       Labs: Physical Examination:  Filed Vitals:   03/13/13 1100  BP: 121/74  Pulse: 76  Temp: 97.8 F (36.6 C)    Wt Readings from Last 3 Encounters:  03/13/13 280 lb 14.4 oz (127.415 kg)  03/06/13 281 lb 14.4 oz (127.869 kg)  02/27/13 281 lb 4.8 oz (127.597 kg)    The left breast area shows hyperpigmentation changes and erythema but no moist desquamation Lungs - Normal respiratory effort, chest expands symmetrically. Lungs are clear to auscultation, no crackles or wheezes.  Heart has regular rhythm and rate  Abdomen is soft and non tender with normal bowel sounds  Assessment:  Patient tolerating treatments well  Plan: Continue treatment per original radiation prescription

## 2013-03-14 ENCOUNTER — Ambulatory Visit: Payer: Medicare Other

## 2013-03-14 ENCOUNTER — Ambulatory Visit
Admission: RE | Admit: 2013-03-14 | Discharge: 2013-03-14 | Disposition: A | Discharge status: Home / Self Care | Payer: Medicare Other | Source: Ambulatory Visit | Attending: Radiation Oncology | Admitting: Radiation Oncology

## 2013-03-15 ENCOUNTER — Ambulatory Visit
Admission: RE | Admit: 2013-03-15 | Discharge: 2013-03-15 | Disposition: A | Discharge status: Home / Self Care | Payer: Medicare Other | Source: Ambulatory Visit | Attending: Radiation Oncology | Admitting: Radiation Oncology

## 2013-03-15 ENCOUNTER — Ambulatory Visit: Payer: Medicare Other

## 2013-03-16 ENCOUNTER — Ambulatory Visit: Payer: Medicare Other

## 2013-03-16 ENCOUNTER — Ambulatory Visit
Admission: RE | Admit: 2013-03-16 | Discharge: 2013-03-16 | Disposition: A | Discharge status: Home / Self Care | Payer: Medicare Other | Source: Ambulatory Visit | Attending: Radiation Oncology | Admitting: Radiation Oncology

## 2013-03-19 ENCOUNTER — Ambulatory Visit
Admission: RE | Admit: 2013-03-19 | Discharge: 2013-03-19 | Disposition: A | Discharge status: Home / Self Care | Payer: Medicare Other | Source: Ambulatory Visit | Attending: Radiation Oncology | Admitting: Radiation Oncology

## 2013-03-19 ENCOUNTER — Ambulatory Visit: Payer: Medicare Other

## 2013-03-20 ENCOUNTER — Ambulatory Visit
Admission: RE | Admit: 2013-03-20 | Discharge: 2013-03-20 | Disposition: A | Discharge status: Home / Self Care | Payer: Medicare Other | Source: Ambulatory Visit | Attending: Radiation Oncology | Admitting: Radiation Oncology

## 2013-03-20 VITALS — BP 121/68 | HR 75 | Temp 97.6°F | Ht 66.0 in

## 2013-03-20 DIAGNOSIS — C50412 Malignant neoplasm of upper-outer quadrant of left female breast: Secondary | ICD-10-CM

## 2013-03-20 NOTE — Progress Notes (Signed)
[facility]    Radiation Oncology [address] [place]     Maryln Gottron, M.D. [place], [place] [address]               Billie Lade, M.D., Ph.D. Phone: [phone]      Molli Hazard A. Kathrynn Running, M.D. Fax: [phone]      Radene Gunning, M.D., Ph.D.         Lurline Hare, M.D.         Grayland Jack, M.D Weekly Treatment Management Note  Name: [name]     MRN: [id number]        CSN: [id number] Date: 03/20/2013      DOB: 09/04/1945  CC: [name], MD         Corliss Blacker    Status: Outpatient  Diagnosis: Left breast cancer  Current Dose: 54.4 Gy  Current Fraction: 30  Planned Dose: 60.4 Gy  Narrative: [name] was seen today for weekly treatment management. The chart was checked and port films  were reviewed. She is starting to have some fatigue at this time. She is also noticed some discomfort along the lateral breast area. She did take 1 of her narcotic pain medications last night.  Review of patient's allergies indicates no known allergies. Current Outpatient Prescriptions  Medication Sig Dispense Refill  . ACCU-CHEK AVIVA PLUS test strip       . aspirin EC 81 MG tablet Take 81 mg by mouth daily.      . celecoxib (CELEBREX) 200 MG capsule Take 1 capsule (200 mg total) by mouth daily. For pain  30 capsule  0  . Cholecalciferol (VITAMIN D-3) 5000 UNITS TABS Take 5,000 Units by mouth daily.      . diclofenac sodium (VOLTAREN) 1 % GEL Apply topically as needed.      Marland Kitchen emollient (BIAFINE) cream Apply topically 2 (two) times daily.      Marland Kitchen HYDROcodone-acetaminophen (NORCO/VICODIN) 5-325 MG per tablet Take 1-2 tablets by mouth every 6 (six) hours as needed.  30 tablet  0  . levothyroxine (SYNTHROID, LEVOTHROID) 200 MCG tablet Take 200 mcg by mouth daily.      Marland Kitchen lisinopril (PRINIVIL,ZESTRIL) 10 MG tablet Take 10 mg by mouth daily.      . meloxicam (MOBIC) 15 MG tablet Take 15 mg by mouth as needed.      . metFORMIN (GLUCOPHAGE) 1000 MG tablet Take  500-1,000 mg by mouth 2 (two) times daily with a meal. 500mg  in the morning and 1000mg  at night      . non-metallic deodorant (ALRA) MISC Apply 1 application topically daily as needed.      . simvastatin (ZOCOR) 40 MG tablet Take 40 mg by mouth daily.      Marland Kitchen zolpidem (AMBIEN) 10 MG tablet Take 1 tablet (10 mg total) by mouth at bedtime as needed for sleep. For sleep  30 tablet  0  . anastrozole (ARIMIDEX) 1 MG tablet Take 1 tablet (1 mg total) by mouth daily.  90 tablet  4  . hyaluronate sodium (RADIAPLEXRX) GEL Apply 1 application topically 2 (two) times daily.      Marland Kitchen oxyCODONE (OXY IR/ROXICODONE) 5 MG immediate release tablet Take 1-2 tablets (5-10 mg total) by mouth every 3 (three) hours as needed for pain.  100 tablet  0   No current facility-administered medications for this encounter.    Physical Examination:  Filed Vitals:   03/20/13 1112  BP: 121/68  Pulse: 75  Temp: 97.6 F (36.4 C)    Wt Readings from Last 3 Encounters:  03/13/13 280 lb 14.4 oz (127.415 kg)  03/06/13 281 lb 14.4 oz (127.869 kg)  02/27/13 281 lb 4.8 oz (127.597 kg)    The left breast area shows hyperpigmentation changes and erythema but no moist desquamation  Lungs - Normal respiratory effort, chest expands symmetrically. Lungs are clear to auscultation, no crackles or wheezes.  Heart has regular rhythm and rate  Abdomen is soft and non tender with normal bowel sounds  Assessment:  Patient tolerating treatments well  Plan: Continue treatment per original radiation prescription

## 2013-03-20 NOTE — Progress Notes (Signed)
  Radiation Oncology         [phone] ________________________________  Name: [name] MRN: [id number]  Date: 03/20/2013  DOB: 12-22-1945  Simulation Verification Note- performed on December 15  Status: outpatient  NARRATIVE: The patient was brought to the treatment unit and placed in the planned treatment position. The clinical setup was verified. Then port films were obtained and uploaded to the radiation oncology medical record software.  The treatment beams were carefully compared against the planned radiation fields. The position location and shape of the radiation fields was reviewed. They targeted volume of tissue appears to be appropriately covered by the radiation beams. Organs at risk appear to be excluded as planned.  Based on my personal review, I approved the simulation verification. The patient's treatment will proceed as planned.  -----------------------------------  Billie Lade, PhD, MD

## 2013-03-20 NOTE — Progress Notes (Signed)
[name] has had 30 fractions to her left breast.  She had pain in the side of her left breast last night.  She does have fatigue.  The skin on her left breast and underarm has hyperpigmentation.  She has areas of peeling underneath her left breast.  She is using biafine cream twice a day.

## 2013-03-21 ENCOUNTER — Ambulatory Visit: Payer: Medicare Other

## 2013-03-21 ENCOUNTER — Ambulatory Visit
Admission: RE | Admit: 2013-03-21 | Discharge: 2013-03-21 | Disposition: A | Discharge status: Home / Self Care | Payer: Medicare Other | Source: Ambulatory Visit | Attending: Radiation Oncology | Admitting: Radiation Oncology

## 2013-03-22 ENCOUNTER — Ambulatory Visit: Payer: Medicare Other

## 2013-03-22 ENCOUNTER — Ambulatory Visit
Admission: RE | Admit: 2013-03-22 | Discharge: 2013-03-22 | Disposition: A | Discharge status: Home / Self Care | Payer: Medicare Other | Source: Ambulatory Visit | Attending: Radiation Oncology | Admitting: Radiation Oncology

## 2013-03-23 ENCOUNTER — Encounter: Payer: Self-pay | Admitting: Radiation Oncology

## 2013-03-23 ENCOUNTER — Ambulatory Visit
Admission: RE | Admit: 2013-03-23 | Discharge: 2013-03-23 | Disposition: A | Discharge status: Home / Self Care | Payer: Medicare Other | Source: Ambulatory Visit | Attending: Radiation Oncology | Admitting: Radiation Oncology

## 2013-03-23 ENCOUNTER — Ambulatory Visit: Payer: Medicare Other

## 2013-03-23 NOTE — Progress Notes (Signed)
Pt denies pain, fatigue, loss of appetite. She is applying lotion to left breast treatment area; hyperpigmentation but no desquamation. Pt completed today; gave her 1 month FU card.

## 2013-04-22 ENCOUNTER — Encounter: Payer: Self-pay | Admitting: Radiation Oncology

## 2013-04-22 NOTE — Progress Notes (Signed)
  Radiation Oncology         [phone] ________________________________  Name: [name] MRN: [id number]  Date: 04/22/2013  DOB: 11/13/1945  End of Treatment Note  Diagnosis:   Intraductal carcinoma of the left breast     Indication for treatment:  Breast conservation therapy       Radiation treatment dates:   October 30 through December 19  Site/dose:   Left breast 50.4 gray in 28 fractions; the lumpectomy cavity was boosted to 60.4 gray  Beams/energy:   Tangential beams encompass in the left breast, deep inspiration and breath hold techniques were used to deliver the treatment,  the lumpectomy cavity was boosted with a reduced field photon set up using at AP and left posterior oblique fields  Narrative: The patient tolerated radiation treatment relatively well.   Towards the end of her therapy she did experience some fatigue as well as some discomfort within the lateral aspect of the breast. Patient did not experience any moist desquamation; erythema and hyperpigmentation changes only  Plan: The patient has completed radiation treatment. The patient will return to radiation oncology clinic for routine followup in one month. I advised them to call or return sooner if they have any questions or concerns related to their recovery or treatment.  -----------------------------------  Blair Promise, PhD, MD

## 2013-04-25 ENCOUNTER — Encounter: Payer: Self-pay | Admitting: Oncology

## 2013-04-26 ENCOUNTER — Ambulatory Visit: Payer: Medicare Other | Admitting: Radiation Oncology

## 2013-05-07 ENCOUNTER — Telehealth: Payer: Self-pay | Admitting: Oncology

## 2013-05-07 ENCOUNTER — Ambulatory Visit
Admission: RE | Admit: 2013-05-07 | Discharge: 2013-05-07 | Disposition: A | Discharge status: Home / Self Care | Payer: Medicare Other | Source: Ambulatory Visit | Attending: Radiation Oncology | Admitting: Radiation Oncology

## 2013-05-07 ENCOUNTER — Encounter: Payer: Self-pay | Admitting: Radiation Oncology

## 2013-05-07 VITALS — BP 138/66 | HR 83 | Temp 97.6°F | Ht 66.0 in | Wt 284.1 lb

## 2013-05-07 DIAGNOSIS — C50412 Malignant neoplasm of upper-outer quadrant of left female breast: Secondary | ICD-10-CM

## 2013-05-07 NOTE — Progress Notes (Signed)
Radiation Oncology         [phone] ________________________________  Name: [name] MRN: [id number]  Date: 05/07/2013  DOB: Oct 08, 1945  Follow-Up Visit Note  CC: Cari Caraway, MD  Shann Medal, MD  Diagnosis:   Intraductal carcinoma the left breast  Interval Since Last Radiation:  1  months  Narrative:  The patient returns today for routine follow-up.  She is doing well at this time. She denies any significant fatigue. She occasionally will have sharp shooting pains within the breast but nothing consistent. She denies any nipple discharge or bleeding.                              ALLERGIES:  has No Known Allergies.  Meds: Current Outpatient Prescriptions  Medication Sig Dispense Refill  . ACCU-CHEK AVIVA PLUS test strip       . aspirin EC 81 MG tablet Take 81 mg by mouth daily.      . Cholecalciferol (VITAMIN D-3) 5000 UNITS TABS Take 5,000 Units by mouth daily.      . diclofenac sodium (VOLTAREN) 1 % GEL Apply topically as needed.      Marland Kitchen levothyroxine (SYNTHROID, LEVOTHROID) 200 MCG tablet Take 200 mcg by mouth daily.      Marland Kitchen lisinopril (PRINIVIL,ZESTRIL) 10 MG tablet Take 10 mg by mouth daily.      . metFORMIN (GLUCOPHAGE) 1000 MG tablet Take 500-1,000 mg by mouth 2 (two) times daily with a meal. 500mg  in the morning and 1000mg  at night      . simvastatin (ZOCOR) 40 MG tablet Take 40 mg by mouth daily.      Marland Kitchen zolpidem (AMBIEN) 10 MG tablet Take 1 tablet (10 mg total) by mouth at bedtime as needed for sleep. For sleep  30 tablet  0  . anastrozole (ARIMIDEX) 1 MG tablet Take 1 tablet (1 mg total) by mouth daily.  90 tablet  4  . celecoxib (CELEBREX) 200 MG capsule Take 1 capsule (200 mg total) by mouth daily. For pain  30 capsule  0  . emollient (BIAFINE) cream Apply topically 2 (two) times daily.      . hyaluronate sodium (RADIAPLEXRX) GEL Apply 1 application topically 2 (two) times daily.      Marland Kitchen HYDROcodone-acetaminophen (NORCO/VICODIN) 5-325 MG per tablet Take 1-2  tablets by mouth every 6 (six) hours as needed.  30 tablet  0  . meloxicam (MOBIC) 15 MG tablet Take 15 mg by mouth as needed.      . non-metallic deodorant Jethro Poling) MISC Apply 1 application topically daily as needed.      Marland Kitchen oxyCODONE (OXY IR/ROXICODONE) 5 MG immediate release tablet Take 1-2 tablets (5-10 mg total) by mouth every 3 (three) hours as needed for pain.  100 tablet  0   No current facility-administered medications for this encounter.    Physical Findings: The patient is in no acute distress. Patient is alert and oriented.  height is 5\' 6"  (1.676 m) and weight is 284 lb 1.6 oz (128.867 kg). Her temperature is 97.6 F (36.4 C). Her blood pressure is 138/66 and her pulse is 83. Marland Kitchen  No palpable supraclavicular or axillary adenopathy. The lungs are clear to auscultation. The heart has a regular rhythm and rate. The right breast reveals no mass or nipple discharge. Examination of the left breast reveals hyperpigmentation changes. There is no dominant mass appreciated in the breast. The patient's skin is well healed  at this time.  Lab Findings: Lab Results  Component Value Date   WBC 4.6 12/20/2012   HGB 13.4 12/28/2012   HCT 39.0 12/20/2012   MCV 83.0 12/20/2012   PLT 191 12/20/2012     Radiographic Findings: No results found.  Impression:  The patient is recovering from the effects of radiation.  No evidence of recurrence on clinical exam today  Plan:  Routine followup in 6 months. I've encouraged patient to start taking her Arimidex.  ____________________________________ Blair Promise, MD

## 2013-05-07 NOTE — Progress Notes (Signed)
[name] is here for follow up after treatment to her left breast.  She denies pain except for occasional sharp pains in her left breast.  She reports they have been happening less often.  She has not started Arimidex yet because she has been busy and does not want to have the side effects.  She is planning on starting it soon.  The skin on her left breast in intact with hyperpigmentation.   She is using vitamin E cream.  She is wondering if it would be OK to go in a swimming pool.

## 2013-05-07 NOTE — Telephone Encounter (Signed)
Called and left a message for Quandra to call back.  Asked if she was coming to her follow up appointment today with Dr. Sondra Come.

## 2013-07-03 ENCOUNTER — Telehealth: Payer: Self-pay | Admitting: Oncology

## 2013-07-03 NOTE — Telephone Encounter (Signed)
m °

## 2013-07-04 ENCOUNTER — Ambulatory Visit: Payer: Medicare Other | Admitting: Oncology

## 2013-08-31 ENCOUNTER — Telehealth: Payer: Self-pay | Admitting: Oncology

## 2013-09-03 ENCOUNTER — Ambulatory Visit (HOSPITAL_BASED_OUTPATIENT_CLINIC_OR_DEPARTMENT_OTHER): Payer: Medicare Other | Admitting: Oncology

## 2013-09-03 ENCOUNTER — Telehealth: Payer: Self-pay | Admitting: Oncology

## 2013-09-03 VITALS — BP 136/84 | HR 84 | Temp 98.1°F | Resp 18 | Ht 66.0 in | Wt 279.1 lb

## 2013-09-03 DIAGNOSIS — M899 Disorder of bone, unspecified: Secondary | ICD-10-CM

## 2013-09-03 DIAGNOSIS — M949 Disorder of cartilage, unspecified: Secondary | ICD-10-CM

## 2013-09-03 DIAGNOSIS — C50412 Malignant neoplasm of upper-outer quadrant of left female breast: Secondary | ICD-10-CM

## 2013-09-03 DIAGNOSIS — Z170 Estrogen receptor positive status [ER+]: Secondary | ICD-10-CM

## 2013-09-03 DIAGNOSIS — D0590 Unspecified type of carcinoma in situ of unspecified breast: Secondary | ICD-10-CM

## 2013-09-03 MED ORDER — EXEMESTANE 25 MG PO TABS: 25.0000 mg | ORAL_TABLET | Freq: Every day | ORAL | Status: DC

## 2013-09-03 NOTE — Addendum Note (Signed)
Addended by: Laureen Abrahams on: 09/03/2013 05:59 PM   Modules accepted: Orders

## 2013-09-03 NOTE — Progress Notes (Signed)
Patient ID: [name], female   DOB: [birth year], 68 y.o.   MRN: [id number] ID: [name] OB: [birth year]  MR#: [id number]  [id number]  PCP: Cari Caraway, MD GYN:   SU: Alphonsa Overall OTHER MD: Gery Pray, Johnnette Litter  CHIEF COMPLAINT: follow up on DCIS TREATMENT: on antiestrogen therapy  HISTORY OF PRESENT ILLNESS: From the original intake note:  [name] had routine screening mammography at the [facility] in [place] 11/27/2012 showing new calcifications in the left breast upper outer quadrant. Additional views 11/28/2012 showed a 1.4 cm cluster of pleomorphic calcifications, for which biopsy was recommended. 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  Bilateral breast MRI 12/19/2012 showed only post biopsy change.  The patient's subsequent history is as detailed below  INTERVAL HISTORY: [name] returns today for followup of her noninvasive breast cancer. Since her last visit here she completed her radiation in December and started her anastrozole in mid January. Within 10 days she felt terribly fatigued. She stopped it for a week went back 3 days a week and is still cluster significant fatigue. Accordingly she stopped sometime in February.  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 Occasionally she has stabbing pains in her left breast, which are very brief period gender status these are postoperative. She sleeps on 2 pillows. She has some joint aches and pains which are not more persistent or intense than prior. Otherwise a detailed review of systems today was noncontributory  PAST MEDICAL HISTORY: Past Medical History  Diagnosis Date  . Right knee DJD   . Diabetes   . History of pulmonary embolism 2007    x  9  . Hypertension   . High cholesterol   . Pulmonary embolism   . Pneumonia   . Bell's palsy     Resolved  . 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 Subcutaneous hematoma left leg just outside knee joint 04/10/2012    Culture was done of this fluid collection is pending  Gram stain had white cells but no organism  . Breast cancer 12/13/2012    left uoq- DCIS  . Thyroid disease   . Wears glasses   . Wears partial dentures     upper partial  . Sleep apnea     uses a cpap  . PONV (postoperative nausea and vomiting)     still got sick 1/14-may need scop patch  . Edema leg   . History of radiation therapy 02/01/13-03/23/13    left breast 50.4 gray, lumpectomy cavity boosted to 60.4 gray    PAST SURGICAL HISTORY: Past Surgical History  Procedure Laterality Date  . Knee arthroplasty  2007    left  . Abdominal hysterectomy  1981    with unilateral slapingo-oophorectomy  . Thyroidectomy  2010  . Total knee arthroplasty  04/10/2012    Procedure: TOTAL KNEE ARTHROPLASTY;  Surgeon: Lorn Junes, MD;  Location: [facility];  Service: Orthopedics;  Laterality: Right;  . Breast lumpectomy with needle localization Left 12/28/2012    Procedure: BREAST LUMPECTOMY WITH NEEDLE LOCALIZATION;  Surgeon: Shann Medal, MD;  Location: [facility];  Service: General;  Laterality: Left;    FAMILY HISTORY Family History  Problem Relation Age of Onset  . Hypertension Mother   . Heart attack Mother   .  Heart disease Mother     before age 10  . Other Mother     varicose veins  . Cancer Father     kidney  . Diabetes Father   . Heart disease Father   . Hyperlipidemia Father   . Hypertension Father   . Diabetes Son   . Hyperlipidemia Son   . Hypertension Son   . Other Son     varicose veins  . Breast cancer Paternal Aunt    the patient's father died from a heart attack at age 62. The patient's mother died in an automobile accident at age 83. The patient had one brother and one sister. The patient's  father did have renal cell carcinoma diagnosed at age 40, cured with surgery. There is no history of breast or ovarian cancer in the family to the patient's knowledge.  GYNECOLOGIC HISTORY:  Menarche age 89, first live birth age 84, the patient is [name] P4. She underwent hysterectomy with unilateral salpingo-oophorectomy in 1981. She did not take hormone replacement.  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 Her husband Vanice Sarah Goguen the third (goes by [employer]") was a Restaurant manager, fast food. Both Bill and Nimrit are now [employer]. They exhibit locally. [name] lives in [place] and works in [place]. [name] lives in [place] and works for [employer] long care. [name] lives in [place] as a Freight forwarder for [employer]. [name] also lives in [place] and is a Conservation officer, historic buildings. The patient has 11 grandchildren. She attends a [employer] in [place].    ADVANCED DIRECTIVES: In place   HEALTH MAINTENANCE: History  Substance Use Topics  . Smoking status: Never Smoker   . Smokeless tobacco: Never Used  . Alcohol Use: No     Colonoscopy: 2012/ [facility]  PAP: 2009  Bone density:  Lipid panel:  No Known Allergies  Current Outpatient Prescriptions  Medication Sig Dispense Refill  . ACCU-CHEK AVIVA PLUS test strip       . anastrozole (ARIMIDEX) 1 MG tablet Take 1 tablet (1 mg total) by mouth daily.  90 tablet  4  . aspirin EC 81 MG tablet Take 81 mg by mouth daily.      . celecoxib (CELEBREX) 200 MG capsule Take 1 capsule (200 mg total) by mouth daily. For pain  30 capsule  0  . Cholecalciferol (VITAMIN D-3) 5000 UNITS TABS Take 5,000 Units by mouth daily.      . diclofenac sodium (VOLTAREN) 1 % GEL Apply topically as needed.      Marland Kitchen emollient (BIAFINE) cream Apply topically 2 (two) times daily.      . hyaluronate sodium (RADIAPLEXRX) GEL Apply 1 application topically 2 (two) times daily.      Marland Kitchen HYDROcodone-acetaminophen (NORCO/VICODIN) 5-325 MG per  tablet Take 1-2 tablets by mouth every 6 (six) hours as needed.  30 tablet  0  . levothyroxine (SYNTHROID, LEVOTHROID) 200 MCG tablet Take 200 mcg by mouth daily.      Marland Kitchen lisinopril (PRINIVIL,ZESTRIL) 10 MG tablet Take 10 mg by mouth daily.      . meloxicam (MOBIC) 15 MG tablet Take 15 mg by mouth as needed.      . metFORMIN (GLUCOPHAGE) 1000 MG tablet Take 500-1,000 mg by mouth 2 (two) times daily with a meal. 500mg  in the morning and 1000mg  at night      . non-metallic deodorant (ALRA) MISC Apply 1 application topically daily as needed.      Marland Kitchen  oxyCODONE (OXY IR/ROXICODONE) 5 MG immediate release tablet Take 1-2 tablets (5-10 mg total) by mouth every 3 (three) hours as needed for pain.  100 tablet  0  . simvastatin (ZOCOR) 40 MG tablet Take 40 mg by mouth daily.      Marland Kitchen zolpidem (AMBIEN) 10 MG tablet Take 1 tablet (10 mg total) by mouth at bedtime as needed for sleep. For sleep  30 tablet  0   No current facility-administered medications for this visit.   OBJECTIVE: Middle-aged white woman in no acute distress  ECOG FS:1 - Symptomatic but completely ambulatory  Sclerae unicteric, EOMs intact Oropharynx clear and moist No cervical or supraclavicular adenopathy Lungs no rales or rhonchi Heart regular rate and rhythm Abd soft, obese, nontender, positive bowel sounds MSK no focal spinal tenderness, no upper extremity lymphedema Neuro: nonfocal, well oriented, positive affect Breasts: The right breast is unremarkable. The left breast is status post lumpectomy and radiation. There is some hyperpigmentation still. This is slowly clearing. I do not see any other skin or nipple changes of concern. The left axilla is benign.  LAB RESULTS:  CMP     Component Value Date/Time   NA 142 12/20/2012 0845   NA 137 04/12/2012 0600   K 4.2 12/20/2012 0845   K 4.5 04/12/2012 0600   CL 102 04/12/2012 0600   CO2 31* 12/20/2012 0845   CO2 28 04/12/2012 0600   GLUCOSE 137 12/20/2012 0845   GLUCOSE 192* 04/12/2012  0600   BUN 22.2 12/20/2012 0845   BUN 20 04/12/2012 0600   CREATININE 0.9 12/20/2012 0845   CREATININE 0.93 11/29/2012 0815   CALCIUM 9.3 12/20/2012 0845   CALCIUM 8.7 04/12/2012 0600   PROT 6.9 12/20/2012 0845   PROT 7.2 03/31/2012 1428   ALBUMIN 3.6 12/20/2012 0845   ALBUMIN 3.7 03/31/2012 1428   AST 13 12/20/2012 0845   AST 16 03/31/2012 1428   ALT 8 12/20/2012 0845   ALT 14 03/31/2012 1428   ALKPHOS 69 12/20/2012 0845   ALKPHOS 76 03/31/2012 1428   BILITOT 1.08 12/20/2012 0845   BILITOT 0.5 03/31/2012 1428   GFRNONAA 63* 11/29/2012 0815   GFRAA 73* 11/29/2012 0815    I No results found for this basename: SPEP,  UPEP,   kappa and lambda light chains    Lab Results  Component Value Date   WBC 4.6 12/20/2012   NEUTROABS 2.8 12/20/2012   HGB 13.4 12/28/2012   HCT 39.0 12/20/2012   MCV 83.0 12/20/2012   PLT 191 12/20/2012      Chemistry      Component Value Date/Time   NA 142 12/20/2012 0845   NA 137 04/12/2012 0600   K 4.2 12/20/2012 0845   K 4.5 04/12/2012 0600   CL 102 04/12/2012 0600   CO2 31* 12/20/2012 0845   CO2 28 04/12/2012 0600   BUN 22.2 12/20/2012 0845   BUN 20 04/12/2012 0600   CREATININE 0.9 12/20/2012 0845   CREATININE 0.93 11/29/2012 0815      Component Value Date/Time   CALCIUM 9.3 12/20/2012 0845   CALCIUM 8.7 04/12/2012 0600   ALKPHOS 69 12/20/2012 0845   ALKPHOS 76 03/31/2012 1428   AST 13 12/20/2012 0845   AST 16 03/31/2012 1428   ALT 8 12/20/2012 0845   ALT 14 03/31/2012 1428   BILITOT 1.08 12/20/2012 0845   BILITOT 0.5 03/31/2012 1428       No results found for this basename: LABCA2    No components  found with this basename: [id number]    No results found for this basename: INR,  in the last 168 hours  Urinalysis    Component Value Date/Time   COLORURINE YELLOW 04/10/2012 [place] 04/10/2012 1136   LABSPEC 1.025 04/10/2012 1136   PHURINE 6.0 04/10/2012 1136   GLUCOSEU NEGATIVE 04/10/2012 [place] 04/10/2012 [place]  04/10/2012 [place] 04/10/2012 1136   PROTEINUR NEGATIVE 04/10/2012 1136   UROBILINOGEN 2.0* 04/10/2012 1136   NITRITE NEGATIVE 04/10/2012 1136   LEUKOCYTESUR NEGATIVE 04/10/2012 1136    STUDIES: No results found.   ASSESSMENT: 68 y.o. [place] Stakes, [place] woman status post left breast biopsy 12/13/2012 for a ductal carcinoma in situ, grade 1, estrogen receptor 100% positive, progesterone receptor 44% positive  (1) status post left lumpectomy 12/28/2012 showing a 9 mm area of ductal carcinoma in situ, low-grade, with close but negative margins.  (2) adjuvant radiation completed 03/23/2013  (3) started anastrozole January 2015, stopped after a few weeks because of intolerance (fatigue); started exemestane June 2015  (4) Mild osteopenia, with bone density 01/26/2013 showing a T score of -1.1   PLAN: Lasasha was not able to tolerate the anastrozole. Possibly when she was actually feeling was residual effects from her radiation. In any case she given her to try and it didn't work.  Today we reviewed anti-estrogens and I would be very reluctant to try tamoxifen with her, given her history of clots. Accordingly the remaining choices are letrozole which is very similar to anastrozole, and exemestane.  We're going to try exemestane. We talked about cost issues as well as the possible toxicities, side effects and complications of this agent. I have put in the prescription for her.  She will see me again in September. She is tolerating the exemestane well, we will continue that for 5 years. Otherwise we will simply observe her for 5 years on no anti-estrogens. In any case I will start seeing her once a year after the next visit here.  Trysten has a good understanding of the overall plan. She agrees with that. She knows the goal of treatment in her case is cure. She will call with any problems that may develop before her next visit   Chauncey Cruel, MD   09/03/2013 11:43 AM

## 2013-09-19 ENCOUNTER — Encounter (INDEPENDENT_AMBULATORY_CARE_PROVIDER_SITE_OTHER): Payer: Self-pay | Admitting: Surgery

## 2013-10-26 ENCOUNTER — Other Ambulatory Visit: Payer: Self-pay | Admitting: Oncology

## 2013-10-26 DIAGNOSIS — Z853 Personal history of malignant neoplasm of breast: Secondary | ICD-10-CM

## 2013-11-05 ENCOUNTER — Ambulatory Visit: Payer: Medicare Other | Admitting: Radiation Oncology

## 2013-11-19 ENCOUNTER — Ambulatory Visit
Admission: RE | Admit: 2013-11-19 | Discharge: 2013-11-19 | Disposition: A | Discharge status: Home / Self Care | Payer: Medicare Other | Source: Ambulatory Visit | Attending: Radiation Oncology | Admitting: Radiation Oncology

## 2013-11-19 ENCOUNTER — Encounter: Payer: Self-pay | Admitting: Radiation Oncology

## 2013-11-19 VITALS — BP 116/63 | HR 84 | Temp 97.9°F | Ht 66.0 in | Wt 282.8 lb

## 2013-11-19 DIAGNOSIS — C50412 Malignant neoplasm of upper-outer quadrant of left female breast: Secondary | ICD-10-CM

## 2013-11-19 NOTE — Progress Notes (Signed)
[name] here for follow up after treatment to her left breast.  She denies pain except for occasional tenderness in her left breast.  She has lost 9 lbs since December and has been trying to loose weight.  She is currently taking Aromasin.  The skin on her left breast is intact with hyperpigmentation underneath the breast.

## 2013-11-19 NOTE — Progress Notes (Signed)
Radiation Oncology         [phone] ________________________________  Name: [name] MRN: [id number]  Date: 11/19/2013  DOB: March 08, 1946  Follow-Up Visit Note  CC: Cari Caraway, MD  Cari Caraway, MD  Diagnosis:   Intraductal carcinoma of the left  Interval Since Last Radiation:  7  months  Narrative:  The patient returns today for routine follow-up.  She is doing recently well at this time. She continues to have problems with her knees despite replacements bilaterally. She did have recent orthoscopic surgery on 1 of her knees. She does complain of fatigue which she noticed when starting Arimidex. She has some mild discomfort in the lower inner quadrant of the left breast but no consistent pain. She denies any nipple discharge or bleeding.                              ALLERGIES:  has No Known Allergies.  Meds: Current Outpatient Prescriptions  Medication Sig Dispense Refill  . ACCU-CHEK AVIVA PLUS test strip       . aspirin EC 81 MG tablet Take 81 mg by mouth daily.      . celecoxib (CELEBREX) 200 MG capsule Take 1 capsule (200 mg total) by mouth daily. For pain  30 capsule  0  . Cholecalciferol (VITAMIN D-3) 5000 UNITS TABS Take 5,000 Units by mouth daily.      . diclofenac sodium (VOLTAREN) 1 % GEL Apply topically as needed.      Marland Kitchen exemestane (AROMASIN) 25 MG tablet Take 1 tablet (25 mg total) by mouth daily after breakfast.  30 tablet  12  . levothyroxine (SYNTHROID, LEVOTHROID) 200 MCG tablet Take 200 mcg by mouth daily.      Marland Kitchen lisinopril (PRINIVIL,ZESTRIL) 10 MG tablet Take 10 mg by mouth daily.      . metFORMIN (GLUCOPHAGE) 1000 MG tablet Take 500-1,000 mg by mouth 2 (two) times daily with a meal. 500mg  in the morning and 1000mg  at night      . simvastatin (ZOCOR) 40 MG tablet Take 40 mg by mouth daily.      Marland Kitchen zolpidem (AMBIEN) 10 MG tablet Take 1 tablet (10 mg total) by mouth at bedtime as needed for sleep. For sleep  30 tablet  0  . emollient (BIAFINE) cream Apply  topically 2 (two) times daily.      . hyaluronate sodium (RADIAPLEXRX) GEL Apply 1 application topically 2 (two) times daily.      Marland Kitchen HYDROcodone-acetaminophen (NORCO/VICODIN) 5-325 MG per tablet Take 1-2 tablets by mouth every 6 (six) hours as needed.  30 tablet  0  . non-metallic deodorant (ALRA) MISC Apply 1 application topically daily as needed.       No current facility-administered medications for this encounter.    Physical Findings: The patient is in no acute distress. Patient is alert and oriented.  height is 5\' 6"  (1.676 m) and weight is 282 lb 12.8 oz (128.277 kg). Her oral temperature is 97.9 F (36.6 C). Her blood pressure is 116/63 and her pulse is 84. Marland Kitchen  No palpable supraclavicular or axillary adenopathy. Lungs are clear to auscultation. The heart has a regular rhythm and rate. Examination of the right breast reveals no mass or nipple discharge. Examination of left breast reveals mild hyperpigmentation changes. There is no dominant mass appreciated breast nipple discharge or bleeding. I am unable to palpate any area of concern in the lower inner quadrant of the  left breast.  Lab Findings: Lab Results  Component Value Date   WBC 4.6 12/20/2012   HGB 13.4 12/28/2012   HCT 39.0 12/20/2012   MCV 83.0 12/20/2012   PLT 191 12/20/2012      Radiographic Findings: No results found.  Impression:  No evidence of recurrence on clinical exam today.  Plan:  When necessary followup in radiation oncology. Patient continues to followup with medical oncology and surgery. She is scheduled for mammograms next month and have requested a copy of these reports for our records.  ____________________________________ Blair Promise, MD

## 2013-12-05 ENCOUNTER — Ambulatory Visit
Admission: RE | Admit: 2013-12-05 | Discharge: 2013-12-05 | Disposition: A | Discharge status: Home / Self Care | Payer: Medicare Other | Source: Ambulatory Visit | Attending: Oncology | Admitting: Oncology

## 2013-12-05 DIAGNOSIS — Z853 Personal history of malignant neoplasm of breast: Secondary | ICD-10-CM

## 2013-12-17 ENCOUNTER — Telehealth: Payer: Self-pay | Admitting: Oncology

## 2013-12-17 ENCOUNTER — Ambulatory Visit (HOSPITAL_BASED_OUTPATIENT_CLINIC_OR_DEPARTMENT_OTHER): Payer: Medicare Other | Admitting: Oncology

## 2013-12-17 VITALS — BP 138/72 | HR 74 | Temp 97.5°F | Resp 20 | Ht 66.0 in | Wt 283.9 lb

## 2013-12-17 DIAGNOSIS — M949 Disorder of cartilage, unspecified: Secondary | ICD-10-CM

## 2013-12-17 DIAGNOSIS — Z170 Estrogen receptor positive status [ER+]: Secondary | ICD-10-CM

## 2013-12-17 DIAGNOSIS — E119 Type 2 diabetes mellitus without complications: Secondary | ICD-10-CM

## 2013-12-17 DIAGNOSIS — G510 Bell's palsy: Secondary | ICD-10-CM

## 2013-12-17 DIAGNOSIS — D0590 Unspecified type of carcinoma in situ of unspecified breast: Secondary | ICD-10-CM

## 2013-12-17 DIAGNOSIS — C50412 Malignant neoplasm of upper-outer quadrant of left female breast: Secondary | ICD-10-CM

## 2013-12-17 DIAGNOSIS — M899 Disorder of bone, unspecified: Secondary | ICD-10-CM

## 2013-12-17 NOTE — Progress Notes (Signed)
Patient ID: [name], female   DOB: Mar 15, 1946, 68 y.o.   MRN: [id number] ID: [name] OB: December 02, 1945  MR#: [id number]  EYC#:[id number]  PCP: Jacqueline Caraway, MD GYN:   SU: [name] OTHER MD: [name], [name]  CHIEF COMPLAINT: follow up on DCIS TREATMENT: Exemestane  HISTORY OF PRESENT ILLNESS: From the original intake note:  [name] had routine screening mammography at the [facility] in [place] 11/27/2012 showing new calcifications in the left breast upper outer quadrant. Additional views 11/28/2012 showed a 1.4 cm cluster of pleomorphic calcifications, for which biopsy was recommended. 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  Bilateral breast MRI 12/19/2012 showed only post biopsy change.  The patient's subsequent history is as detailed below  INTERVAL HISTORY: [name] returns today for followup of her noninvasive breast cancer. The interval history is generally unremarkable. She found that the exemestane was a little rough on her but if she takes it only on Mondays, Wednesdays and Fridays, she does just fine. She base $18 a month for that.  REVIEW OF SYSTEMS: [name], doing swimming, water aerobics, and walking around in the water. 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 She is minimally fatigued, has some joint pains here in there which are not more persistent or intense than before, and tells me her diabetes is doing well. What is wearing her is the finding on her most recent mammogram, which showed some indeterminate new calcifications. She has been scheduled for a six-month followup. Aside from all this, a detailed review of systems today was noncontributory  PAST MEDICAL HISTORY: Past Medical History  Diagnosis Date  .  Right knee DJD   . Diabetes   . History of pulmonary embolism 2007    x 9  . Hypertension   . High cholesterol   . Pulmonary embolism   . Pneumonia   . Bell's palsy     Resolved  . 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 Subcutaneous hematoma left leg just outside knee joint 04/10/2012    Culture was done of this fluid collection is pending  Gram stain had white cells but no organism  . Breast cancer 12/13/2012    left uoq- DCIS  . Thyroid disease   . Wears glasses   . Wears partial dentures     upper partial  . Sleep apnea     uses a cpap  . PONV (postoperative nausea and vomiting)     still got sick 1/14-may need scop patch  . Edema leg   . History of radiation therapy 02/01/13-03/23/13    left breast 50.4 gray, lumpectomy cavity boosted to 60.4 gray    PAST SURGICAL HISTORY: Past Surgical History  Procedure Laterality Date  . Knee arthroplasty  2007    left  . Abdominal hysterectomy  1981    with unilateral slapingo-oophorectomy  . Thyroidectomy  2010  . Total knee arthroplasty  04/10/2012    Procedure: TOTAL KNEE ARTHROPLASTY;  Surgeon: Jacqueline Junes, MD;  Location: [facility];  Service: Orthopedics;  Laterality: Right;  . Breast lumpectomy with needle localization Left 12/28/2012    Procedure: BREAST LUMPECTOMY WITH NEEDLE LOCALIZATION;  Surgeon: Jacqueline Medal, MD;  Location: [facility];  Service: General;  Laterality: Left;    FAMILY HISTORY Family  History  Problem Relation Age of Onset  . Hypertension Mother   . Heart attack Mother   . Heart disease Mother     before age 52  . Other Mother     varicose veins  . Cancer Father     kidney  . Diabetes Father   . Heart disease Father   . Hyperlipidemia Father   . Hypertension Father   . Diabetes Son   . Hyperlipidemia Son   . Hypertension Son   . Other Son     varicose veins  . Breast cancer Paternal Aunt    the patient's father died from a heart attack at age 26. The patient's mother died in [name] automobile  accident at age 14. The patient had one brother and one sister. [name] did have renal cell carcinoma diagnosed at age 41, cured with surgery. There is no history of breast or ovarian cancer in the family to the patient's knowledge.  GYNECOLOGIC HISTORY:  Menarche age 89, first live birth age 43, the patient is [place] P4. She underwent hysterectomy with unilateral salpingo-oophorectomy in 1981. She did not take hormone replacement.  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 Her husband [name] the third (goes by [employer]") was a Restaurant manager, fast food. Both [name] and [name] are now [employer]. They exhibit locally. Their son [name] lives in [place] and works in [place]. Son [name] lives in [place] and works for [employer] long care. Son [name] lives in [place] as a Freight forwarder for [employer]. Son [name] also lives in [place] and is a Conservation officer, historic buildings. The patient has 11 grandchildren. She attends a [employer] in [place].    ADVANCED DIRECTIVES: In place   HEALTH MAINTENANCE: History  Substance Use Topics  . Smoking status: Never Smoker   . Smokeless tobacco: Never Used  . Alcohol Use: No     Colonoscopy: 2012/ [name]  PAP: 2009  Bone density:  Lipid panel:  No Known Allergies  Current Outpatient Prescriptions  Medication Sig Dispense Refill  . ACCU-CHEK AVIVA PLUS test strip       . aspirin EC 81 MG tablet Take 81 mg by mouth daily.      . celecoxib (CELEBREX) 200 MG capsule Take 1 capsule (200 mg total) by mouth daily. For pain  30 capsule  0  . Cholecalciferol (VITAMIN D-3) 5000 UNITS TABS Take 5,000 Units by mouth daily.      . diclofenac sodium (VOLTAREN) 1 % GEL Apply topically as needed.      Marland Kitchen emollient (BIAFINE) cream Apply topically 2 (two) times daily.      Marland Kitchen exemestane (AROMASIN) 25 MG tablet Take 1 tablet (25 mg total) by mouth daily after breakfast.  30 tablet  12  . hyaluronate sodium (RADIAPLEXRX) GEL Apply 1  application topically 2 (two) times daily.      Marland Kitchen HYDROcodone-acetaminophen (NORCO/VICODIN) 5-325 MG per tablet Take 1-2 tablets by mouth every 6 (six) hours as needed.  30 tablet  0  . levothyroxine (SYNTHROID, LEVOTHROID) 200 MCG tablet Take 200 mcg by mouth daily.      Marland Kitchen lisinopril (PRINIVIL,ZESTRIL) 10 MG tablet Take 10 mg by mouth daily.      . metFORMIN (GLUCOPHAGE) 1000 MG tablet Take 500-1,000 mg by mouth 2 (two) times daily with a meal. 500mg  in the morning and 1000mg  at night      . non-metallic deodorant (ALRA) MISC Apply 1 application topically daily as needed.      Marland Kitchen  simvastatin (ZOCOR) 40 MG tablet Take 40 mg by mouth daily.      Marland Kitchen zolpidem (AMBIEN) 10 MG tablet Take 1 tablet (10 mg total) by mouth at bedtime as needed for sleep. For sleep  30 tablet  0   No current facility-administered medications for this visit.   OBJECTIVE: Middle-aged white woman who appears stated age  ECOG FS:0 - Asymptomatic  Sclerae unicteric, pupils round and equal Oropharynx clear and moist No cervical or supraclavicular adenopathy Lungs no rales or rhonchi Heart regular rate and rhythm Abd soft, obese, nontender, positive bowel sounds MSK no focal spinal tenderness, no upper extremity lymphedema Neuro: nonfocal, well oriented, positive affect Breasts: The right breast is unremarkable. The left breast is status post lumpectomy and radiation. The nipple is chronically inverted. There is no finding of concern for recurrence. The left axilla is benign.  LAB RESULTS:  CMP     Component Value Date/Time   NA 142 12/20/2012 0845   NA 137 04/12/2012 0600   K 4.2 12/20/2012 0845   K 4.5 04/12/2012 0600   CL 102 04/12/2012 0600   CO2 31* 12/20/2012 0845   CO2 28 04/12/2012 0600   GLUCOSE 137 12/20/2012 0845   GLUCOSE 192* 04/12/2012 0600   BUN 22.2 12/20/2012 0845   BUN 20 04/12/2012 0600   CREATININE 0.9 12/20/2012 0845   CREATININE 0.93 11/29/2012 0815   CALCIUM 9.3 12/20/2012 0845   CALCIUM 8.7 04/12/2012 0600    PROT 6.9 12/20/2012 0845   PROT 7.2 03/31/2012 1428   ALBUMIN 3.6 12/20/2012 0845   ALBUMIN 3.7 03/31/2012 1428   AST 13 12/20/2012 0845   AST 16 03/31/2012 1428   ALT 8 12/20/2012 0845   ALT 14 03/31/2012 1428   ALKPHOS 69 12/20/2012 0845   ALKPHOS 76 03/31/2012 1428   BILITOT 1.08 12/20/2012 0845   BILITOT 0.5 03/31/2012 1428   GFRNONAA 63* 11/29/2012 0815   GFRAA 73* 11/29/2012 0815    I No results found for this basename: SPEP,  UPEP,   kappa and lambda light chains    Lab Results  Component Value Date   WBC 4.6 12/20/2012   NEUTROABS 2.8 12/20/2012   HGB 13.4 12/28/2012   HCT 39.0 12/20/2012   MCV 83.0 12/20/2012   PLT 191 12/20/2012      Chemistry      Component Value Date/Time   NA 142 12/20/2012 0845   NA 137 04/12/2012 0600   K 4.2 12/20/2012 0845   K 4.5 04/12/2012 0600   CL 102 04/12/2012 0600   CO2 31* 12/20/2012 0845   CO2 28 04/12/2012 0600   BUN 22.2 12/20/2012 0845   BUN 20 04/12/2012 0600   CREATININE 0.9 12/20/2012 0845   CREATININE 0.93 11/29/2012 0815      Component Value Date/Time   CALCIUM 9.3 12/20/2012 0845   CALCIUM 8.7 04/12/2012 0600   ALKPHOS 69 12/20/2012 0845   ALKPHOS 76 03/31/2012 1428   AST 13 12/20/2012 0845   AST 16 03/31/2012 1428   ALT 8 12/20/2012 0845   ALT 14 03/31/2012 1428   BILITOT 1.08 12/20/2012 0845   BILITOT 0.5 03/31/2012 1428       No results found for this basename: LABCA2    No components found with this basename: LABCA125    No results found for this basename: INR,  in the last 168 hours  Urinalysis    Component Value Date/Time   COLORURINE YELLOW 04/10/2012 [place] 04/10/2012  1136   LABSPEC 1.025 04/10/2012 1136   PHURINE 6.0 04/10/2012 1136   GLUCOSEU NEGATIVE 04/10/2012 [place] 04/10/2012 [place] 04/10/2012 1136   KETONESUR NEGATIVE 04/10/2012 1136   PROTEINUR NEGATIVE 04/10/2012 1136   UROBILINOGEN 2.0* 04/10/2012 1136   NITRITE NEGATIVE 04/10/2012 1136   [place]  04/10/2012 1136    STUDIES: [facility]m Digital Diagnostic Bilat  12/05/2013   CLINICAL DATA:  Patient status post left breast lumpectomy 12/28/2012. Pathology demonstrated low grade ductal carcinoma in situ.  EXAM: DIGITAL DIAGNOSTIC  BILATERAL MAMMOGRAM WITH CAD  COMPARISON:  [facility]  ACR Breast Density Category b: There are scattered areas of fibroglandular density.  FINDINGS: Post lumpectomy changes left breast. Single punctate calcification at the lumpectomy site may represent dystrophic changes or early fat necrosis. No additional concerning masses, calcifications or nonsurgical architectural distortion identified within either breast.  Mammographic images were processed with CAD.  IMPRESSION: Post lumpectomy changes left breast. Single punctate calcification at the lumpectomy site may represent dystrophic calcification or early changes of fat necrosis, probably benign.  RECOMMENDATION: Left breast diagnostic mammography in 6 months to re-evaluate the lumpectomy site and single punctate calcification.  I have discussed the findings and recommendations with the patient. Results were also provided in writing at the conclusion of the visit. If applicable, a reminder letter will be sent to the patient regarding the next appointment.  BI-RADS CATEGORY  3: Probably benign.   Electronically Signed   By: Lovey Newcomer M.D.   On: 12/05/2013 12:01     ASSESSMENT: 68 y.o. Shea Stakes, [name] woman status post left breast biopsy 12/13/2012 for a ductal carcinoma in situ, grade 1, estrogen receptor 100% positive, progesterone receptor 44% positive  (1) status post left lumpectomy 12/28/2012 showing a 9 mm area of ductal carcinoma in situ, low-grade, with close but negative margins.  (2) adjuvant radiation completed 03/23/2013  (3) started anastrozole January 2015, stopped after a few weeks because of intolerance (fatigue); started exemestane June 2015, being taken every Monday Wednesday and Friday  (4) Mild osteopenia, with bone  density 01/26/2013 showing a T score of -1.1   PLAN: Montserrat is tolerating the exemestane at a lower dose with no significant side effects. The plan is going to be to continue that for a total of 5 years.   She will be due for a repeat DEXA scan October of 2016. Until then she will continue her vitamin D and she has been encouraged to exercise in addition to her water aerobics.  We discussed her mammogram next findings extensively. She understands most of the time these things turn out to be not significant and waiting is very reasonable. I did not think he would be a good idea to proceed to [name] MRI, which  in addition to cost problems can result in false positives. She will make sure I got a copy of her 6 month followup mammogram.  She is going to return to see me in one year. [name] has a good understanding of the Montgomery plan. She agrees with that. She knows the goal of treatment in her case is cure. She will call with any problems that may develop before her next visit   MAGRINAT,GUSTAV C, MD   12/17/2013 10:34 AM

## 2013-12-17 NOTE — Telephone Encounter (Signed)
pt cld to verify time of appt-gave pt time for today @ 10

## 2013-12-17 NOTE — Telephone Encounter (Signed)
per pof to sch pt appt-gave pt copy of sch °

## 2013-12-17 NOTE — Addendum Note (Signed)
Addended by: Renford Dills on: 12/17/2013 11:29 AM   Modules accepted: Orders

## 2014-01-03 ENCOUNTER — Ambulatory Visit (INDEPENDENT_AMBULATORY_CARE_PROVIDER_SITE_OTHER): Payer: Medicare Other | Admitting: Surgery

## 2014-02-04 ENCOUNTER — Encounter: Payer: Self-pay | Admitting: Radiation Oncology

## 2014-05-06 ENCOUNTER — Other Ambulatory Visit: Payer: Self-pay | Admitting: Oncology

## 2014-05-06 DIAGNOSIS — Z853 Personal history of malignant neoplasm of breast: Secondary | ICD-10-CM

## 2014-05-06 DIAGNOSIS — R921 Mammographic calcification found on diagnostic imaging of breast: Secondary | ICD-10-CM

## 2014-06-05 ENCOUNTER — Other Ambulatory Visit: Payer: Self-pay

## 2014-06-05 ENCOUNTER — Other Ambulatory Visit: Payer: Self-pay | Admitting: Oncology

## 2014-06-05 DIAGNOSIS — R921 Mammographic calcification found on diagnostic imaging of breast: Secondary | ICD-10-CM

## 2014-06-05 DIAGNOSIS — Z853 Personal history of malignant neoplasm of breast: Secondary | ICD-10-CM

## 2014-06-07 ENCOUNTER — Ambulatory Visit
Admission: RE | Admit: 2014-06-07 | Discharge: 2014-06-07 | Disposition: A | Discharge status: Home / Self Care | Payer: Medicare Other | Source: Ambulatory Visit | Attending: Oncology | Admitting: Oncology

## 2014-06-07 DIAGNOSIS — Z853 Personal history of malignant neoplasm of breast: Secondary | ICD-10-CM

## 2014-06-07 DIAGNOSIS — R921 Mammographic calcification found on diagnostic imaging of breast: Secondary | ICD-10-CM

## 2014-11-05 ENCOUNTER — Other Ambulatory Visit: Payer: Self-pay | Admitting: Oncology

## 2014-11-05 DIAGNOSIS — Z853 Personal history of malignant neoplasm of breast: Secondary | ICD-10-CM

## 2014-12-11 ENCOUNTER — Ambulatory Visit
Admission: RE | Admit: 2014-12-11 | Discharge: 2014-12-11 | Disposition: A | Discharge status: Home / Self Care | Payer: Medicare Other | Source: Ambulatory Visit | Attending: Oncology | Admitting: Oncology

## 2014-12-11 ENCOUNTER — Other Ambulatory Visit: Payer: Self-pay | Admitting: Oncology

## 2014-12-11 DIAGNOSIS — Z853 Personal history of malignant neoplasm of breast: Secondary | ICD-10-CM

## 2014-12-24 ENCOUNTER — Telehealth: Payer: Self-pay | Admitting: Oncology

## 2014-12-24 NOTE — Telephone Encounter (Signed)
Pt called to r/s MD visit due to her husband is having treatment at [facility], confirmed new D/T .Marland KitchenMarland Kitchen KJ

## 2014-12-26 ENCOUNTER — Ambulatory Visit: Payer: Medicare Other | Admitting: Oncology

## 2015-01-08 ENCOUNTER — Other Ambulatory Visit: Payer: Self-pay | Admitting: Oncology

## 2015-01-08 ENCOUNTER — Telehealth: Payer: Self-pay | Admitting: Oncology

## 2015-01-08 ENCOUNTER — Ambulatory Visit (HOSPITAL_BASED_OUTPATIENT_CLINIC_OR_DEPARTMENT_OTHER): Payer: Medicare Other | Admitting: Oncology

## 2015-01-08 VITALS — BP 129/61 | HR 74 | Temp 97.9°F | Resp 18 | Ht 66.0 in | Wt 276.5 lb

## 2015-01-08 DIAGNOSIS — D0512 Intraductal carcinoma in situ of left breast: Secondary | ICD-10-CM

## 2015-01-08 DIAGNOSIS — C50911 Malignant neoplasm of unspecified site of right female breast: Secondary | ICD-10-CM

## 2015-01-08 DIAGNOSIS — M8580 Other specified disorders of bone density and structure, unspecified site: Secondary | ICD-10-CM

## 2015-01-08 DIAGNOSIS — Z170 Estrogen receptor positive status [ER+]: Secondary | ICD-10-CM | POA: Diagnosis not present

## 2015-01-08 DIAGNOSIS — Z79811 Long term (current) use of aromatase inhibitors: Secondary | ICD-10-CM

## 2015-01-08 DIAGNOSIS — E119 Type 2 diabetes mellitus without complications: Secondary | ICD-10-CM

## 2015-01-08 DIAGNOSIS — E669 Obesity, unspecified: Secondary | ICD-10-CM | POA: Diagnosis not present

## 2015-01-08 DIAGNOSIS — C50912 Malignant neoplasm of unspecified site of left female breast: Principal | ICD-10-CM

## 2015-01-08 MED ORDER — EXEMESTANE 25 MG PO TABS: 25.0000 mg | ORAL_TABLET | Freq: Every morning | ORAL | Status: DC

## 2015-01-08 NOTE — Progress Notes (Signed)
Patient ID: [name], female   DOB: November 18, 1945, 69 y.o.   MRN: [id number] ID: [name] OB: Nov 10, 1945  MR#: [id number]  HBZ#:[id number]  PCP: Jacqueline Caraway, MD GYN:   SU: [name] OTHER MD: [name], [name]  CHIEF COMPLAINT: follow up on DCIS TREATMENT: Exemestane  HISTORY OF PRESENT ILLNESS: From the original intake note:  [name] had routine screening mammography at the [name] [place] 11/27/2012 showing new calcifications in the left breast upper outer quadrant. Additional views 11/28/2012 showed a 1.4 cm cluster of pleomorphic calcifications, for which biopsy was recommended. 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  Bilateral breast MRI 12/19/2012 showed only post biopsy change.  The patient's subsequent history is as detailed below  INTERVAL HISTORY: [name] returns today for followup of her noninvasive breast cancer. She continues on exemestane. Mostly she has been taking this Mondays, Wednesdays and Fridays, but for the last 2 weeks she's been taking it 5 days in a row, stopping on weekends. She's had no problems taking at this way. She obtains it at practically no cost  REVIEW OF SYSTEMS: The biggest problem [name] experiences his pain in the lateral aspect of her left breast. This lasts seconds. It may happen once or at most twice daily. It can be positional to some extent. It "takes her breath away". Aside from that she spends a lot of time sitting at her [place], which she exhibits through [place] in [place]. She is sleeping better. She gets a little bit of ankle swelling at times. She does have arthritis pains here in there which are not more intense or persistent than before. She tells her diabetes is "doing well". She tells me she has a mucous cyst in her left sinus cavity  and that she is planning to have that removed soon. A detailed review of systems today was otherwise stable.  PAST MEDICAL HISTORY: Past Medical History  Diagnosis Date  . Right knee DJD   . Diabetes   . History of pulmonary embolism 2007    x 9  . Hypertension   . High cholesterol   . Pulmonary embolism   . Pneumonia   . Bell's palsy     Resolved  . 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 Subcutaneous hematoma left leg just outside knee joint 04/10/2012    Culture was done of this fluid collection is pending  Gram stain had white cells but no organism  . Breast cancer 12/13/2012    left uoq- DCIS  . Thyroid disease   . Wears glasses   . Wears partial dentures     upper partial  . Sleep apnea     uses a cpap  . PONV (postoperative nausea and vomiting)     still got sick 1/14-may need scop patch  . Edema leg   . History of radiation therapy 02/01/13-03/23/13    left breast 50.4 gray, lumpectomy cavity boosted to 60.4 gray    PAST SURGICAL HISTORY: Past Surgical History  Procedure Laterality Date  . Knee arthroplasty  2007    left  . Abdominal hysterectomy  1981    with unilateral slapingo-oophorectomy  . Thyroidectomy  2010  . Total knee arthroplasty  04/10/2012    Procedure: TOTAL KNEE ARTHROPLASTY;  Surgeon: Jacqueline Junes, MD;  Location: [facility];  Service: Orthopedics;  Laterality: Right;  . Breast lumpectomy with  needle localization Left 12/28/2012    Procedure: BREAST LUMPECTOMY WITH NEEDLE LOCALIZATION;  Surgeon: Jacqueline Medal, MD;  Location: [facility];  Service: General;  Laterality: Left;    FAMILY HISTORY Family History  Problem Relation Age of Onset  . Hypertension Mother   . Heart attack Mother   . Heart disease Mother     before age 34  . Other Mother     varicose veins  . Cancer Father     kidney  . Diabetes Father   . Heart disease Father   . Hyperlipidemia Father   . Hypertension Father   . Diabetes Son   . Hyperlipidemia Son   . Hypertension  Son   . Other Son     varicose veins  . Breast cancer Paternal Aunt    the patient's father died from a heart attack at age 57. The patient's mother died in an automobile accident at age 65. The patient had one brother and one sister. [name] did have renal cell carcinoma diagnosed at age 66, cured with surgery. There is no history of breast or ovarian cancer in the family to the patient's knowledge.  GYNECOLOGIC HISTORY:  Menarche age 32, first live birth age 58, the patient is [place] P4. She underwent hysterectomy with unilateral salpingo-oophorectomy in 1981. She did not take hormone replacement.  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 Her husband [name] the third (goes by [employer]") was a Restaurant manager, fast food. Both [name] and [name] are now [employer]. They exhibit locally. Their son Jacqueline Alfred. Montgomery lives in [place] and works in [place]. Son [name] lives in [place] and works for [employer] long care. Son [name] lives in [place] as a Freight forwarder for [employer]. Son [name] also lives in [place] and is a Conservation officer, historic buildings. The patient has 11 grandchildren. She attends a [name] in [place].    ADVANCED DIRECTIVES: In place   HEALTH MAINTENANCE: Social History  Substance Use Topics  . Smoking status: Never Smoker   . Smokeless tobacco: Never Used  . Alcohol Use: No     Colonoscopy: 2012/ [facility]  PAP: 2009  Bone density:  Lipid panel:  No Known Allergies  Current Outpatient Prescriptions  Medication Sig Dispense Refill  . ACCU-CHEK AVIVA PLUS test strip     . aspirin EC 81 MG tablet Take 81 mg by mouth daily.    . celecoxib (CELEBREX) 200 MG capsule Take 1 capsule (200 mg total) by mouth daily. For pain 30 capsule 0  . Cholecalciferol (VITAMIN D-3) 5000 UNITS TABS Take 5,000 Units by mouth daily.    . diclofenac sodium (VOLTAREN) 1 % GEL Apply topically as needed.    Marland Kitchen exemestane (AROMASIN) 25 MG tablet TAKE 1 TABLET (25 MG TOTAL) BY  MOUTH DAILY AFTER BREAKFAST. 30 tablet 3  . levothyroxine (SYNTHROID, LEVOTHROID) 200 MCG tablet Take 200 mcg by mouth daily.    Marland Kitchen lisinopril (PRINIVIL,ZESTRIL) 10 MG tablet Take 10 mg by mouth daily.    . metFORMIN (GLUCOPHAGE) 1000 MG tablet Take 500-1,000 mg by mouth 2 (two) times daily with a meal. 500mg  in the morning and 1000mg  at night    . non-metallic deodorant (ALRA) MISC Apply 1 application topically daily as needed.    . simvastatin (ZOCOR) 40 MG tablet Take 40 mg by mouth daily.    Marland Kitchen zolpidem (AMBIEN) 10 MG tablet Take 1 tablet (10 mg total) by mouth at bedtime as needed for  sleep. For sleep 30 tablet 0   No current facility-administered medications for this visit.   OBJECTIVE: Middle-aged white woman who appears stated age  ECOG FS:0 - Asymptomatic Filed Vitals:   01/08/15 1018  BP: 129/61  Pulse: 74  Temp: 97.9 F (36.6 C)  Resp: 18   Body mass index is 44.65 kg/(m^2).  Sclerae unicteric, EOMs intact Oropharynx clear and moist No cervical or supraclavicular adenopathy Lungs no rales or rhonchi Heart regular rate and rhythm Abd soft, obese, nontender, positive bowel sounds MSK no focal spinal tenderness, no upper extremity lymphedema Neuro: nonfocal, well oriented, appropriate affect Breasts: The right breast is unremarkable. The left breast is status post lumpectomy and radiation. There is no evidence of local recurrence and palpation of the left/lateral aspect of the breast is unremarkable as well. The nipple is chronically inverted. The left axilla is benign.  LAB RESULTS:  CMP     Component Value Date/Time   NA 142 12/20/2012 0845   NA 137 04/12/2012 0600   K 4.2 12/20/2012 0845   K 4.5 04/12/2012 0600   CL 102 04/12/2012 0600   CO2 31* 12/20/2012 0845   CO2 28 04/12/2012 0600   GLUCOSE 137 12/20/2012 0845   GLUCOSE 192* 04/12/2012 0600   BUN 22.2 12/20/2012 0845   BUN 20 04/12/2012 0600   CREATININE 0.9 12/20/2012 0845   CREATININE 0.93 11/29/2012  0815   CALCIUM 9.3 12/20/2012 0845   CALCIUM 8.7 04/12/2012 0600   PROT 6.9 12/20/2012 0845   PROT 7.2 03/31/2012 1428   ALBUMIN 3.6 12/20/2012 0845   ALBUMIN 3.7 03/31/2012 1428   AST 13 12/20/2012 0845   AST 16 03/31/2012 1428   ALT 8 12/20/2012 0845   ALT 14 03/31/2012 1428   ALKPHOS 69 12/20/2012 0845   ALKPHOS 76 03/31/2012 1428   BILITOT 1.08 12/20/2012 0845   BILITOT 0.5 03/31/2012 1428   GFRNONAA 63* 11/29/2012 0815   GFRAA 73* 11/29/2012 0815    I No results found for: SPEP  Lab Results  Component Value Date   WBC 4.6 12/20/2012   NEUTROABS 2.8 12/20/2012   HGB 13.4 12/28/2012   HCT 39.0 12/20/2012   MCV 83.0 12/20/2012   PLT 191 12/20/2012      Chemistry      Component Value Date/Time   NA 142 12/20/2012 0845   NA 137 04/12/2012 0600   K 4.2 12/20/2012 0845   K 4.5 04/12/2012 0600   CL 102 04/12/2012 0600   CO2 31* 12/20/2012 0845   CO2 28 04/12/2012 0600   BUN 22.2 12/20/2012 0845   BUN 20 04/12/2012 0600   CREATININE 0.9 12/20/2012 0845   CREATININE 0.93 11/29/2012 0815      Component Value Date/Time   CALCIUM 9.3 12/20/2012 0845   CALCIUM 8.7 04/12/2012 0600   ALKPHOS 69 12/20/2012 0845   ALKPHOS 76 03/31/2012 1428   AST 13 12/20/2012 0845   AST 16 03/31/2012 1428   ALT 8 12/20/2012 0845   ALT 14 03/31/2012 1428   BILITOT 1.08 12/20/2012 0845   BILITOT 0.5 03/31/2012 1428       No results found for: LABCA2  No components found for: LABCA125  No results for input(s): INR in the last 168 hours.  Urinalysis    Component Value Date/Time   COLORURINE YELLOW 04/10/2012 1136   APPEARANCEUR CLEAR 04/10/2012 1136   LABSPEC 1.025 04/10/2012 1136   PHURINE 6.0 04/10/2012 1136   GLUCOSEU NEGATIVE 04/10/2012 1136   HGBUR  NEGATIVE 04/10/2012 1136   BILIRUBINUR NEGATIVE 04/10/2012 1136   KETONESUR NEGATIVE 04/10/2012 1136   PROTEINUR NEGATIVE 04/10/2012 1136   UROBILINOGEN 2.0* 04/10/2012 1136   NITRITE NEGATIVE 04/10/2012 1136    LEUKOCYTESUR NEGATIVE 04/10/2012 1136    STUDIES: CLINICAL DATA: History of malignant lumpectomy of the left breast in September, 2014. Previous radiation therapy. Followup evaluation.  EXAM: DIGITAL DIAGNOSTIC BILATERAL MAMMOGRAM WITH 3D TOMOSYNTHESIS AND CAD  COMPARISON: Previous examinations the most recent of which is dated 06/07/2014.  ACR Breast Density Category b: There are scattered areas of fibroglandular density.  FINDINGS: There are stable scarring changes within the lateral left breast related to patient's previous lumpectomy. There is a benign calcification with central lucency consistent with a dystrophic calcification present associated with the lumpectomy bed. There is no worrisome calcification, mass, or worrisome distortion within either breast and the parenchymal pattern is stable.  Mammographic images were processed with CAD.  IMPRESSION: No findings worrisome for recurrent tumor or developing malignancy.  RECOMMENDATION: Bilateral diagnostic mammography in 1 year.  I have discussed the findings and recommendations with the patient. Results were also provided in writing at the conclusion of the visit. If applicable, a reminder letter will be sent to the patient regarding the next appointment.  BI-RADS CATEGORY 2: Benign.   Electronically Signed  By: Altamese Cabal M.D.  On: 12/11/2014 12:46   ASSESSMENT: 69 y.o. [place], [place] woman status post left breast biopsy 12/13/2012 for a ductal carcinoma in situ, grade 1, estrogen receptor 100% positive, progesterone receptor 44% positive  (1) status post left lumpectomy 12/28/2012 showing a 9 mm area of ductal carcinoma in situ, low-grade, with close but negative margins.  (2) adjuvant radiation completed 03/23/2013  (3) started anastrozole January 2015, stopped after a few weeks because of intolerance (fatigue); started exemestane June 2015, being taken every Monday Wednesday and  Friday  (4) Mild osteopenia, with bone density 01/26/2013 showing a T score of -1.1  (5) obesity  (6) DM II   PLAN: [name] is now 2 years out from her definitive surgery with no evidence of disease activity. She is accepting taking exemestane more standard fashion by taking 5 days in a row and then off on weekends. I encouraged her to continue this and if she does well with that she can take it 7 days in a row which is of course how it is normally taken. I did refill the prescription for her. Luckily she is able to obtain it at a very good price.  I reassured her that the shooting pain she experiences in the lateral aspect of her left breast is benign. It may disappear 4 years, diarrhea. Later. It is going to be related to nerve endings near her scar tissue. Unfortunately we don't have a way to prevented. No one would want to take long-term pain medication for something that occurs a couple of seconds on occasional days.  She is exercising some at the pool, usually 3 days a week, walking in the water. It would be terrific if she could up that to 5 days a week and 45 minutes each time.  Otherwise she is behind on her bone density and I have scheduled that for this November. She will call for results. She will see me again a year from now. She knows to contact us for any other issues that may develop before her next visit here.  Chauncey Cruel, MD   01/08/2015 8:51 AM

## 2015-01-08 NOTE — Telephone Encounter (Signed)
Called and left a message with new 2017 appointment and dexa  anne

## 2015-02-07 ENCOUNTER — Other Ambulatory Visit: Payer: Medicare Other

## 2015-06-30 ENCOUNTER — Ambulatory Visit: Payer: Medicare Other | Admitting: Podiatry

## 2015-11-07 ENCOUNTER — Other Ambulatory Visit: Payer: Self-pay | Admitting: Family Medicine

## 2015-11-07 DIAGNOSIS — Z853 Personal history of malignant neoplasm of breast: Secondary | ICD-10-CM

## 2015-12-12 ENCOUNTER — Other Ambulatory Visit: Payer: Medicare Other

## 2016-01-07 ENCOUNTER — Telehealth: Payer: Self-pay | Admitting: Oncology

## 2016-01-07 NOTE — Telephone Encounter (Signed)
10/5 appointment rescheduled to 11/9 per patient request. The patient requested to reschedule her appointment within the week of 11/06.

## 2016-01-08 ENCOUNTER — Ambulatory Visit: Payer: Medicare Other | Admitting: Oncology

## 2016-01-16 ENCOUNTER — Telehealth: Payer: Self-pay | Admitting: *Deleted

## 2016-01-16 NOTE — Telephone Encounter (Signed)
"  Trish at the [facility] calling to reports the order in EPIC has expired.  A new referral for the Bone Density is needed.  We faxed request to ext [phone] or Dr. Jana Hakim can send this through [facility]."

## 2016-01-19 ENCOUNTER — Other Ambulatory Visit: Payer: Medicare Other

## 2016-01-22 ENCOUNTER — Other Ambulatory Visit: Payer: Self-pay | Admitting: *Deleted

## 2016-01-22 DIAGNOSIS — Z79811 Long term (current) use of aromatase inhibitors: Secondary | ICD-10-CM

## 2016-01-22 DIAGNOSIS — C50412 Malignant neoplasm of upper-outer quadrant of left female breast: Secondary | ICD-10-CM

## 2016-01-22 DIAGNOSIS — Z170 Estrogen receptor positive status [ER+]: Principal | ICD-10-CM

## 2016-01-27 ENCOUNTER — Ambulatory Visit
Admission: RE | Admit: 2016-01-27 | Discharge: 2016-01-27 | Disposition: A | Discharge status: Home / Self Care | Payer: Medicare Other | Source: Ambulatory Visit | Attending: Family Medicine | Admitting: Family Medicine

## 2016-01-27 ENCOUNTER — Ambulatory Visit
Admission: RE | Admit: 2016-01-27 | Discharge: 2016-01-27 | Disposition: A | Discharge status: Home / Self Care | Payer: Medicare Other | Source: Ambulatory Visit | Attending: Oncology | Admitting: Oncology

## 2016-01-27 ENCOUNTER — Other Ambulatory Visit: Payer: Medicare Other

## 2016-01-27 DIAGNOSIS — C50412 Malignant neoplasm of upper-outer quadrant of left female breast: Secondary | ICD-10-CM

## 2016-01-27 DIAGNOSIS — Z170 Estrogen receptor positive status [ER+]: Principal | ICD-10-CM

## 2016-01-27 DIAGNOSIS — Z79811 Long term (current) use of aromatase inhibitors: Secondary | ICD-10-CM

## 2016-01-27 DIAGNOSIS — Z853 Personal history of malignant neoplasm of breast: Secondary | ICD-10-CM

## 2016-02-12 ENCOUNTER — Ambulatory Visit (HOSPITAL_BASED_OUTPATIENT_CLINIC_OR_DEPARTMENT_OTHER): Payer: Medicare Other | Admitting: Oncology

## 2016-02-12 VITALS — BP 129/61 | HR 72 | Temp 98.1°F | Resp 18 | Ht 66.0 in | Wt 278.4 lb

## 2016-02-12 DIAGNOSIS — C50412 Malignant neoplasm of upper-outer quadrant of left female breast: Secondary | ICD-10-CM

## 2016-02-12 DIAGNOSIS — E119 Type 2 diabetes mellitus without complications: Secondary | ICD-10-CM

## 2016-02-12 DIAGNOSIS — Z170 Estrogen receptor positive status [ER+]: Secondary | ICD-10-CM

## 2016-02-12 DIAGNOSIS — Z79811 Long term (current) use of aromatase inhibitors: Secondary | ICD-10-CM | POA: Diagnosis not present

## 2016-02-12 DIAGNOSIS — M8580 Other specified disorders of bone density and structure, unspecified site: Secondary | ICD-10-CM

## 2016-02-12 DIAGNOSIS — D0512 Intraductal carcinoma in situ of left breast: Secondary | ICD-10-CM

## 2016-02-12 MED ORDER — EXEMESTANE 25 MG PO TABS: 25.0000 mg | ORAL_TABLET | Freq: Every morning | ORAL | 4 refills | Status: DC

## 2016-02-12 NOTE — Progress Notes (Signed)
Patient ID: [name], female   DOB: [birth year], 70 y.o.   MRN: [id number] ID: [name] OB: 1945/07/01  MR#: [id number]  [id number]  PCP: Cari Caraway, MD GYN:   SU: Alphonsa Overall OTHER MD: Gery Pray, Johnnette Litter  CHIEF COMPLAINT:  DCIS  TREATMENT: Exemestane  HISTORY OF PRESENT ILLNESS: From the original intake note:  [name] had routine screening mammography at the [facility] in [place] 11/27/2012 showing new calcifications in the left breast upper outer quadrant. Additional views 11/28/2012 showed a 1.4 cm cluster of pleomorphic calcifications, for which biopsy was recommended. 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  Bilateral breast MRI 12/19/2012 showed only post biopsy change.  The patient's subsequent history is as detailed below  INTERVAL HISTORY: Tnia returns today for followup of her ductal carcinoma in situ. She continues on exemestane. She has been taking it every other day, but decided to try to take it daily and see what happened. After month she was extremely tired. She wasn't able to function very well. She went off it for a month, felt better, and now is back on it every Monday Wednesday and Friday with good tolerance.  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 This involves traveling in this means that she frequently doesn't get to the swimming pool which is her main form of exercise. She does describe herself is moderately fatigued. A detailed review of systems today was otherwise stable.  PAST MEDICAL HISTORY: Past Medical History:  Diagnosis Date  . Bell's palsy    Resolved  . Breast cancer 12/13/2012   left uoq- DCIS  . Diabetes   . Edema leg   . High cholesterol   . History of pulmonary embolism 2007   x 9  . History of radiation  therapy 02/01/13-03/23/13   left breast 50.4 gray, lumpectomy cavity boosted to 60.4 gray  . Hypertension   . 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 Pneumonia   . PONV (postoperative nausea and vomiting)    still got sick 1/14-may need scop patch  . Pulmonary embolism   . Right knee DJD   . Sleep apnea    uses a cpap  . Subcutaneous hematoma left leg just outside knee joint 04/10/2012   Culture was done of this fluid collection is pending  Gram stain had white cells but no organism  . Thyroid disease   . Wears glasses   . Wears partial dentures    upper partial    PAST SURGICAL HISTORY: Past Surgical History:  Procedure Laterality Date  . ABDOMINAL HYSTERECTOMY  1981   with unilateral slapingo-oophorectomy  . BREAST LUMPECTOMY WITH NEEDLE LOCALIZATION Left 12/28/2012   Procedure: BREAST LUMPECTOMY WITH NEEDLE LOCALIZATION;  Surgeon: Shann Medal, MD;  Location: [facility];  Service: General;  Laterality: Left;  . KNEE ARTHROPLASTY  2007   left  . THYROIDECTOMY  2010  . TOTAL KNEE ARTHROPLASTY  04/10/2012   Procedure: TOTAL KNEE ARTHROPLASTY;  Surgeon: Lorn Junes, MD;  Location: [facility];  Service: Orthopedics;  Laterality: Right;    FAMILY HISTORY Family History  Problem Relation Age of Onset  . Hypertension Mother   . Heart attack Mother   . Heart disease Mother     before age 59  . Other Mother     varicose veins  . Cancer  Father     kidney  . Diabetes Father   . Heart disease Father   . Hyperlipidemia Father   . Hypertension Father   . Diabetes Son   . Hyperlipidemia Son   . Hypertension Son   . Other Son     varicose veins  . Breast cancer Paternal Aunt    the patient's father died from a heart attack at age 31. The patient's mother died in an automobile accident at age 41. The patient had one brother and one sister. [name] did have renal cell carcinoma diagnosed at age 77, cured with surgery. There is no history of breast or ovarian cancer in  the family to the patient's knowledge.  GYNECOLOGIC HISTORY:  Menarche age 38, first live birth age 89, the patient is [place] P4. She underwent hysterectomy with unilateral salpingo-oophorectomy in 1981. She did not take hormone replacement.  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 Her husband [name] (goes by [employer]") was a Restaurant manager, fast food. Both Bill and Srihitha are now [employer]. They exhibit locally. [name] lives in [place] and works in [place]. [name] lives in [place] and works for [employer] long care. [name] lives in [place] as a Freight forwarder for [employer]. [name] also lives in [place] and is a Conservation officer, historic buildings. The patient has 11 grandchildren. She attends a [employer] in [place].    ADVANCED DIRECTIVES: In place   HEALTH MAINTENANCE: Social History  Substance Use Topics  . Smoking status: Never Smoker  . Smokeless tobacco: Never Used  . Alcohol use No     Colonoscopy: 2012/ [facility]  PAP: 2009  Bone density:  Lipid panel:  No Known Allergies  Current Outpatient Prescriptions  Medication Sig Dispense Refill  . ACCU-CHEK AVIVA PLUS test strip     . aspirin EC 81 MG tablet Take 81 mg by mouth daily.    . celecoxib (CELEBREX) 200 MG capsule Take 1 capsule (200 mg total) by mouth daily. For pain 30 capsule 0  . Cholecalciferol (VITAMIN D-3) 5000 UNITS TABS Take 5,000 Units by mouth daily.    . diclofenac sodium (VOLTAREN) 1 % GEL Apply topically as needed.    Marland Kitchen exemestane (AROMASIN) 25 MG tablet Take 1 tablet (25 mg total) by mouth every morning. 90 tablet 4  . levothyroxine (SYNTHROID, LEVOTHROID) 200 MCG tablet Take 200 mcg by mouth daily.    Marland Kitchen lisinopril (PRINIVIL,ZESTRIL) 10 MG tablet Take 10 mg by mouth daily.    . metFORMIN (GLUCOPHAGE) 1000 MG tablet Take 500-1,000 mg by mouth 2 (two) times daily with a meal. 500mg  in the morning and 1000mg  at night    . simvastatin (ZOCOR) 40 MG tablet Take  40 mg by mouth daily.    Marland Kitchen zolpidem (AMBIEN) 10 MG tablet Take 1 tablet (10 mg total) by mouth at bedtime as needed for sleep. For sleep 30 tablet 0   No current facility-administered medications for this visit.    OBJECTIVE: Middle-aged white womanIn no acute distress   ECOG FS:1 - Symptomatic but completely ambulatory Vitals:   02/12/16 1035  BP: 129/61  Pulse: 72  Resp: 18  Temp: 98.1 F (36.7 C)   Body mass index is 44.93 kg/m.  Sclerae unicteric, pupils round and equal Oropharynx clear and moist-- no thrush or other lesions No cervical or supraclavicular adenopathy Lungs no rales or rhonchi Heart regular rate and rhythm Abd soft, obese, nontender,  positive bowel sounds MSK no focal spinal tenderness, no upper extremity lymphedema Neuro: nonfocal, well oriented, appropriate affect Breasts: The right breast is benign. The left breast is status post lumpectomy and radiation. There is no evidence of local recurrence. The left axilla is benign.  LAB RESULTS:  CMP     Component Value Date/Time   NA 142 12/20/2012 0845   K 4.2 12/20/2012 0845   CL 102 04/12/2012 0600   CO2 31 (H) 12/20/2012 0845   GLUCOSE 137 12/20/2012 0845   BUN 22.2 12/20/2012 0845   CREATININE 0.9 12/20/2012 0845   CALCIUM 9.3 12/20/2012 0845   PROT 6.9 12/20/2012 0845   ALBUMIN 3.6 12/20/2012 0845   AST 13 12/20/2012 0845   ALT 8 12/20/2012 0845   ALKPHOS 69 12/20/2012 0845   BILITOT 1.08 12/20/2012 0845   GFRNONAA 63 (L) 11/29/2012 0815   GFRAA 73 (L) 11/29/2012 0815    I No results found for: SPEP  Lab Results  Component Value Date   WBC 4.6 12/20/2012   NEUTROABS 2.8 12/20/2012   HGB 13.4 12/28/2012   HCT 39.0 12/20/2012   MCV 83.0 12/20/2012   PLT 191 12/20/2012      Chemistry      Component Value Date/Time   NA 142 12/20/2012 0845   K 4.2 12/20/2012 0845   CL 102 04/12/2012 0600   CO2 31 (H) 12/20/2012 0845   BUN 22.2 12/20/2012 0845   CREATININE 0.9 12/20/2012 0845       Component Value Date/Time   CALCIUM 9.3 12/20/2012 0845   ALKPHOS 69 12/20/2012 0845   AST 13 12/20/2012 0845   ALT 8 12/20/2012 0845   BILITOT 1.08 12/20/2012 0845       No results found for: LABCA2  No components found for: LABCA125  No results for input(s): INR in the last 168 hours.  Urinalysis    Component Value Date/Time   COLORURINE YELLOW 04/10/2012 1136   APPEARANCEUR CLEAR 04/10/2012 1136   LABSPEC 1.025 04/10/2012 1136   PHURINE 6.0 04/10/2012 1136   GLUCOSEU NEGATIVE 04/10/2012 1136   HGBUR NEGATIVE 04/10/2012 1136   BILIRUBINUR NEGATIVE 04/10/2012 1136   KETONESUR NEGATIVE 04/10/2012 1136   PROTEINUR NEGATIVE 04/10/2012 1136   UROBILINOGEN 2.0 (H) 04/10/2012 1136   NITRITE NEGATIVE 04/10/2012 1136   LEUKOCYTESUR NEGATIVE 04/10/2012 1136    STUDIES: CLINICAL DATA:  Status post left lumpectomy and radiation therapy for breast cancer 2014.  EXAM: 2D DIGITAL DIAGNOSTIC BILATERAL MAMMOGRAM WITH CAD AND ADJUNCT TOMO  COMPARISON:  Previous exam(s).  ACR Breast Density Category b: There are scattered areas of fibroglandular density.  FINDINGS: Stable post lumpectomy changes on the left. No interval findings suspicious for malignancy in either breast.  Mammographic images were processed with CAD.  IMPRESSION: No evidence of malignancy.  RECOMMENDATION: Bilateral diagnostic mammogram in 1 year.  I have discussed the findings and recommendations with the patient. Results were also provided in writing at the conclusion of the visit. If applicable, a reminder letter will be sent to the patient regarding the next appointment.  BI-RADS CATEGORY  2: Benign.   Electronically Signed   By: Claudie Revering M.D.   On: 01/27/2016 15:04 EXAM: DUAL X-RAY ABSORPTIOMETRY (DXA) FOR BONE MINERAL DENSITY  IMPRESSION: Referring Physician:  Chauncey Cruel  PATIENT: Name: JASZMINE, LINHART Patient ID: [id number] Birth Date: 09-11-1945 Height:  65.0 in. Sex: Female Measured: 01/27/2016 Weight: 280.0 lbs. Indications: Aromasin, Breast Cancer History, Caucasian, Estrogen Deficient, Hysterectomy, Low Calcium Intake (  269.3), Postmenopausal, Synthroid, Secondary Osteoporosis Fractures: Treatments: Vitamin D (E933.5)  ASSESSMENT: The BMD measured at Femur Neck Right is 0.759 g/cm2 with a T-score of -2.0. This patient is considered osteopenic according to [facility]) criteria. Lumbar spine was not utilized due to advanced degenerative changes. There has been no statistically significant change in BMD of Left hip since prior exam dated 01/26/2013.  Site Region Measured Date Measured Age YA BMD Significant CHANGE T-score DualFemur Neck Right 01[age]27/[age] 69.5 -2.0 0.759 g/cm2  Left Forearm Radius 33% 01/27/2016 69.5 1.0 0.979 g/cm2  ASSESSMENT: 70 y.o. [place] Stakes, [place] woman status post left breast upper outer quadrant biopsy 12/13/2012 for a ductal carcinoma in situ, grade 1, estrogen receptor 100% positive, progesterone receptor 44% positive  (1) status post left lumpectomy 12/28/2012 showing a 9 mm area of ductal carcinoma in situ, low-grade, with close but negative margins.  (2) adjuvant radiation completed 03/23/2013  (3) started anastrozole January 2015, stopped after a few weeks because of intolerance (fatigue); started exemestane June 2015, now taken every Monday Wednesday and Friday  (4) Mild osteopenia, with bone density 01/26/2013 showing a T score of -1.1  (a) repeat bone density October 2017 stable  (5) obesity  (6) DM II   PLAN: Khloie is now 3 years out from definitive surgery for her breast with no evidence of disease recurrence. This is very favorable.  She made a great effort to increase her dose of exemestane but we are now back to 3 times a week and that is going to be the maximum she can tolerate.The plan is to continue that for a total of 5 years, at which point she will be able to  "graduate".   I am delighted that her bone density is stable and of course that her mammogram is negative. It is also reassuring that her rest densities category B.   She's, see me again in a year. She knows to call for any problems that may develop before that visit.  Chauncey Cruel, MD   02/12/2016 11:08 AM

## 2016-12-17 ENCOUNTER — Other Ambulatory Visit: Payer: Self-pay | Admitting: Family Medicine

## 2016-12-17 DIAGNOSIS — Z853 Personal history of malignant neoplasm of breast: Secondary | ICD-10-CM

## 2017-02-08 ENCOUNTER — Telehealth: Payer: Self-pay | Admitting: *Deleted

## 2017-02-08 NOTE — Telephone Encounter (Signed)
"  My brother has cancer and now lives with me.  He has appointments on 02-10-2017 so I need to reschedule my appointments with Dr. Jana Hakim.  I'd like to come in November 16 th.  If this is not available I can come in on November 19 th, 21 st or 26 th.  I can be reached at [phone]."  To provider for review and re-scheduling orders for [facility] scheduling.  Routing call information to collaborative nurse and provider for review.  Further patient communication through collaborative nurse.

## 2017-02-10 ENCOUNTER — Ambulatory Visit: Payer: Medicare Other | Admitting: Oncology

## 2017-02-10 ENCOUNTER — Other Ambulatory Visit: Payer: Medicare Other

## 2017-02-15 ENCOUNTER — Ambulatory Visit
Admission: RE | Admit: 2017-02-15 | Discharge: 2017-02-15 | Disposition: A | Discharge status: Home / Self Care | Payer: Medicare Other | Source: Ambulatory Visit | Attending: Family Medicine | Admitting: Family Medicine

## 2017-02-15 DIAGNOSIS — Z853 Personal history of malignant neoplasm of breast: Secondary | ICD-10-CM

## 2017-02-15 HISTORY — DX: Personal history of irradiation: Z92.3

## 2017-02-18 ENCOUNTER — Telehealth: Payer: Self-pay | Admitting: Oncology

## 2017-02-18 NOTE — Telephone Encounter (Signed)
Spoke with patient and rescheduled missed appt from 11/18

## 2017-02-21 ENCOUNTER — Ambulatory Visit: Payer: Medicare Other | Admitting: Oncology

## 2017-02-21 ENCOUNTER — Other Ambulatory Visit (HOSPITAL_BASED_OUTPATIENT_CLINIC_OR_DEPARTMENT_OTHER): Payer: Medicare Other

## 2017-02-21 VITALS — BP 107/61 | HR 85 | Temp 97.6°F | Resp 24 | Ht 66.0 in | Wt 271.7 lb

## 2017-02-21 DIAGNOSIS — M8580 Other specified disorders of bone density and structure, unspecified site: Secondary | ICD-10-CM | POA: Diagnosis not present

## 2017-02-21 DIAGNOSIS — D0512 Intraductal carcinoma in situ of left breast: Secondary | ICD-10-CM

## 2017-02-21 DIAGNOSIS — E669 Obesity, unspecified: Secondary | ICD-10-CM

## 2017-02-21 DIAGNOSIS — Z170 Estrogen receptor positive status [ER+]: Secondary | ICD-10-CM | POA: Diagnosis not present

## 2017-02-21 DIAGNOSIS — Z79811 Long term (current) use of aromatase inhibitors: Secondary | ICD-10-CM

## 2017-02-21 DIAGNOSIS — E119 Type 2 diabetes mellitus without complications: Secondary | ICD-10-CM

## 2017-02-21 DIAGNOSIS — C50412 Malignant neoplasm of upper-outer quadrant of left female breast: Secondary | ICD-10-CM

## 2017-02-21 LAB — COMPREHENSIVE METABOLIC PANEL
ALT: <15 U/L (ref 0–55)
AST: 17 U/L (ref 5–34)
Albumin: 4.1 g/dL (ref 3.5–5.0)
Alkaline Phosphatase: 62 U/L (ref 40–150)
Anion Gap: 9 mEq/L (ref 3–11)
BUN: 29.2 mg/dL — ABNORMAL HIGH (ref 7.0–26.0)
CALCIUM: 9.8 mg/dL (ref 8.4–10.4)
CHLORIDE: 105 meq/L (ref 98–109)
CO2: 26 mEq/L (ref 22–29)
Creatinine: 1.2 mg/dL — ABNORMAL HIGH (ref 0.6–1.1)
EGFR: 47 mL/min/{1.73_m2} — ABNORMAL LOW (ref >60)
Glucose: 131 mg/dl (ref 70–140)
POTASSIUM: 4.3 meq/L (ref 3.5–5.1)
SODIUM: 140 meq/L (ref 136–145)
Total Bilirubin: 0.84 mg/dL (ref 0.20–1.20)
Total Protein: 7.7 g/dL (ref 6.4–8.3)

## 2017-02-21 LAB — CBC WITH DIFFERENTIAL/PLATELET
BASO%: 1.3 % (ref 0.0–2.0)
BASOS ABS: 0.1 10*3/uL (ref 0.0–0.1)
EOS%: 2.2 % (ref 0.0–7.0)
Eosinophils Absolute: 0.2 10*3/uL (ref 0.0–0.5)
HCT: 42.5 % (ref 34.8–46.6)
HGB: 13.8 g/dL (ref 11.6–15.9)
LYMPH%: 25.8 % (ref 14.0–49.7)
MCH: 27.2 pg (ref 25.1–34.0)
MCHC: 32.5 g/dL (ref 31.5–36.0)
MCV: 83.5 fL (ref 79.5–101.0)
MONO#: 0.6 10*3/uL (ref 0.1–0.9)
MONO%: 8.6 % (ref 0.0–14.0)
NEUT#: 4.2 10*3/uL (ref 1.5–6.5)
NEUT%: 62.1 % (ref 38.4–76.8)
Platelets: 247 10*3/uL (ref 145–400)
RBC: 5.09 10*6/uL (ref 3.70–5.45)
RDW: 14.3 % (ref 11.2–14.5)
WBC: 6.7 10*3/uL (ref 3.9–10.3)
lymph#: 1.7 10*3/uL (ref 0.9–3.3)

## 2017-02-21 MED ORDER — EXEMESTANE 25 MG PO TABS: 25.0000 mg | ORAL_TABLET | Freq: Every morning | ORAL | 4 refills | Status: DC

## 2017-02-21 NOTE — Progress Notes (Signed)
Patient ID: [name], female   DOB: 04-18-1945, 71 y.o.   MRN: [id number] ID: [name] OB: [birth year]  MR#: [id number]  VPX#:[id number]  PCP: Jacqueline Caraway, Montgomery GYN:   SU: [name] OTHER Montgomery: [name]  CHIEF COMPLAINT:  DCIS  TREATMENT: Exemestane  HISTORY OF PRESENT ILLNESS: From the original intake note:  [name] had routine screening mammography at the [facility] in [place] 11/27/2012 showing new calcifications in the left breast upper outer quadrant. Additional views 11/28/2012 showed a 1.4 cm cluster of pleomorphic calcifications, for which biopsy was recommended. 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  Bilateral breast MRI 12/19/2012 showed only post biopsy change.  The patient's subsequent history is as detailed below  INTERVAL HISTORY: [name] returns today for follow-up and treatment of her ductal carcinoma in situ, estrogen receptor positive.  She continues on exemestane with good tolerance. She notes that when she is tired, she won't take the tamoxifen because it will cause her to be more fatigued. She denies hot flashes and vaginal dryness.  Since [name]'s last visit to the office, she underwent diagnostic bilateral mammography with CAD and tomography on 02/15/2017 at [facility] showing no mammographic evidence of malignancy.  REVIEW OF SYSTEMS: [name] reports that her brother moved with her recently due to  Medical issues. She also notes that her husband is also ill with medical problems. She notes that driving her brother and her husband to medical appointments has been very stressful and exaughsting for her. She reports that she is still working as a Brewing technologist and goes to [employer] shows with her husband on the weekends sometimes. She reports that she also goes to church  and spends time with her grandchildren as much as she can. For exercise, she walks a total of half a mile down her driveway. She denies unusual headaches, visual changes, nausea, vomiting, or dizziness. There has been no unusual cough, phlegm production, or pleurisy. This been no change in bowel or bladder habits. She denies unexplained fatigue or unexplained weight loss, bleeding, rash, or fever. A detailed review of systems was otherwise stable.   PAST MEDICAL HISTORY: Past Medical History:  Diagnosis Date  . Bell's palsy    Resolved  . Breast cancer ([place]) 12/13/2012   left uoq- DCIS  . Diabetes ([place])   . Edema leg   . High cholesterol   . History of pulmonary embolism 2007   x 9  . History of radiation therapy 02/01/13-03/23/13   left breast 50.4 gray, lumpectomy cavity boosted to 60.4 gray  . Hypertension   . 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 Personal history of radiation therapy   . Pneumonia   . PONV (postoperative nausea and vomiting)    still got sick 1/14-may need scop patch  . Pulmonary embolism ([place])   . Right knee DJD   . Sleep apnea    uses a cpap  . Subcutaneous hematoma left leg just outside knee joint 04/10/2012   Culture was done of this fluid collection is pending  Gram stain had white cells but no organism  . Thyroid disease   . Wears glasses   . Wears partial dentures    upper partial    PAST SURGICAL HISTORY: Past Surgical History:  Procedure Laterality Date  . ABDOMINAL HYSTERECTOMY  1981   with unilateral slapingo-oophorectomy  . BREAST LUMPECTOMY  Left 2014  . BREAST LUMPECTOMY WITH NEEDLE LOCALIZATION Left 12/28/2012   Performed by [name]  2007   left  . THYROIDECTOMY  2010  . TOTAL KNEE ARTHROPLASTY Right 04/10/2012   Performed by [name] at [facility] OR    FAMILY HISTORY Family History  Problem Relation Age of Onset  . Hypertension Mother   . Heart attack Mother   . Heart disease  Mother        before age 81  . Other Mother        varicose veins  . Cancer Father        kidney  . Diabetes Father   . Heart disease Father   . Hyperlipidemia Father   . Hypertension Father   . Diabetes Son   . Hyperlipidemia Son   . Hypertension Son   . Other Son        varicose veins  . Breast cancer Paternal Aunt    the patient's father died from a heart attack at age 13. The patient's mother died in an automobile accident at age 30. The patient had one brother and one sister. [name] did have renal cell carcinoma diagnosed at age 68, cured with surgery. There is no history of breast or ovarian cancer in the family to the patient's knowledge.  GYNECOLOGIC HISTORY:  Menarche age 22, first live birth age 64, the patient is [place] P4. She underwent hysterectomy with unilateral salpingo-oophorectomy in 1981. She did not take hormone replacement.  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 Her husband [name] (goes by [name]") was a Restaurant manager, fast food. Both [name] and [name] are now [employer]. They exhibit locally. Their son [name] lives in [place] and works in [place]. Son [name] lives in [place] and works for [employer] long care. Son [name] lives in [place] as a Freight forwarder for [employer]. Son [name] also lives in [place] and is a Conservation officer, historic buildings. The patient has 11 grandchildren. She attends a [name] in [place].    ADVANCED DIRECTIVES: In place   HEALTH MAINTENANCE: Social History   Tobacco Use  . Smoking status: Never Smoker  . Smokeless tobacco: Never Used  Substance Use Topics  . Alcohol use: No  . Drug use: No     Colonoscopy: 2012/ [facility]  PAP: 2009  Bone density:  Lipid panel:  No Known Allergies  Current Outpatient Medications  Medication Sig Dispense Refill  . ACCU-CHEK AVIVA PLUS test strip     . aspirin EC 81 MG tablet Take 81 mg by mouth daily.    . celecoxib (CELEBREX) 200 MG capsule  Take 1 capsule (200 mg total) by mouth daily. For pain 30 capsule 0  . Cholecalciferol (VITAMIN D-3) 5000 UNITS TABS Take 5,000 Units by mouth daily.    . diclofenac sodium (VOLTAREN) 1 % GEL Apply topically as needed.    Marland Kitchen exemestane (AROMASIN) 25 MG tablet Take 1 tablet (25 mg total) every morning by mouth. 90 tablet 4  . levothyroxine (SYNTHROID, LEVOTHROID) 200 MCG tablet Take 200 mcg by mouth daily.    Marland Kitchen lisinopril (PRINIVIL,ZESTRIL) 10 MG tablet Take 10 mg by mouth daily.    . metFORMIN (GLUCOPHAGE) 1000 MG tablet Take 500-1,000 mg by mouth 2 (two) times daily with a meal. 500mg  in the morning and 1000mg  at night    . simvastatin (ZOCOR) 40 MG tablet  Take 40 mg by mouth daily.    Marland Kitchen zolpidem (AMBIEN) 10 MG tablet Take 1 tablet (10 mg total) by mouth at bedtime as needed for sleep. For sleep 30 tablet 0   No current facility-administered medications for this visit.    OBJECTIVE: Middle-aged white womanIn no acute distress   ECOG FS:1 - Symptomatic but completely ambulatory There were no vitals filed for this visit. There is no height or weight on file to calculate BMI.  Sclerae unicteric, EOMs intact Oropharynx clear and moist No cervical or supraclavicular adenopathy Lungs no rales or rhonchi Heart regular rate and rhythm Abd soft, nontender, positive bowel sounds MSK no focal spinal tenderness, no upper extremity lymphedema Neuro: nonfocal, well oriented, appropriate affect Breasts: The right breast is unremarkable.  The left breast is status post lumpectomy and radiation.  There is no evidence of local recurrence.  Both axilla are benign. LAB RESULTS:  CMP     Component Value Date/Time   NA 140 02/21/2017 1310   K 4.3 02/21/2017 1310   CL 102 04/12/2012 0600   CO2 26 02/21/2017 1310   GLUCOSE 131 02/21/2017 1310   BUN 29.2 (H) 02/21/2017 1310   CREATININE 1.2 (H) 02/21/2017 1310   CALCIUM 9.8 02/21/2017 1310   PROT 7.7 02/21/2017 1310   ALBUMIN 4.1 02/21/2017 1310    AST 17 02/21/2017 1310   ALT <15 02/21/2017 1310   ALKPHOS 62 02/21/2017 1310   BILITOT 0.84 02/21/2017 1310   GFRNONAA 63 (L) 11/29/2012 0815   GFRAA 73 (L) 11/29/2012 0815    I No results found for: SPEP  Lab Results  Component Value Date   WBC 6.7 02/21/2017   NEUTROABS 4.2 02/21/2017   HGB 13.8 02/21/2017   HCT 42.5 02/21/2017   MCV 83.5 02/21/2017   PLT 247 02/21/2017      Chemistry      Component Value Date/Time   NA 140 02/21/2017 1310   K 4.3 02/21/2017 1310   CL 102 04/12/2012 0600   CO2 26 02/21/2017 1310   BUN 29.2 (H) 02/21/2017 1310   CREATININE 1.2 (H) 02/21/2017 1310      Component Value Date/Time   CALCIUM 9.8 02/21/2017 1310   ALKPHOS 62 02/21/2017 1310   AST 17 02/21/2017 1310   ALT <15 02/21/2017 1310   BILITOT 0.84 02/21/2017 1310       No results found for: LABCA2  No components found for: LABCA125  No results for input(s): INR in the last 168 hours.  Urinalysis    Component Value Date/Time   COLORURINE YELLOW 04/10/2012 1136   APPEARANCEUR CLEAR 04/10/2012 1136   LABSPEC 1.025 04/10/2012 1136   PHURINE 6.0 04/10/2012 1136   GLUCOSEU NEGATIVE 04/10/2012 1136   HGBUR NEGATIVE 04/10/2012 1136   BILIRUBINUR NEGATIVE 04/10/2012 1136   KETONESUR NEGATIVE 04/10/2012 1136   PROTEINUR NEGATIVE 04/10/2012 1136   UROBILINOGEN 2.0 (H) 04/10/2012 1136   NITRITE NEGATIVE 04/10/2012 1136   LEUKOCYTESUR NEGATIVE 04/10/2012 1136    STUDIES: Since Mardee's last visit to the office, she underwent diagnostic bilateral mammography with CAD and tomography on 02/15/2017 at [facility] showing: Breast density category B. There was no mammographic evidence of malignancy.    ASSESSMENT: 71 y.o. Shea Stakes, [place] woman status post left breast upper outer quadrant biopsy 12/13/2012 for a ductal carcinoma in situ, grade 1, estrogen receptor 100% positive, progesterone receptor 44% positive  (1) status post left lumpectomy 12/28/2012 showing a 9 mm area of  ductal carcinoma  in situ, low-grade, with close but negative margins.  (2) adjuvant radiation completed 03/23/2013  (3) started anastrozole January 2015, stopped after a few weeks because of intolerance (fatigue); started exemestane June 2015, now taken every Monday Wednesday and Friday  (4) Mild osteopenia, with bone density 01/26/2013 showing a T score of -1.1  (a) repeat bone density October 2017 stable  (5) obesity  (6) DM II   PLAN: Josi is now 4 years out from definitive surgery for her breast cancer with no evidence of disease recurrence.  This is very favorable.  She is taking the exemestane in a very irregular fashion but she is taking it at least some.  We have to believe this will have some effect on reducing her breast cancer recurrence risk.  In any case she will see me again in 1 year.  At that time she will be ready to "graduate".  In the meantime I have encouraged her to "do something for the caregiver", between work and taking care of her husband and brother she is neglecting herself and this really can lead to negative consequences long-term  She knows to call for any other issues that may develop before the next visit.  Fedrick Cefalu, Virgie Dad, Montgomery  02/21/17 2:26 PM Medical Oncology and Hematology [facility] [address] [place], [place] [address] Tel. [phone]    Fax. [phone]  This document serves as a record of services personally performed by Lurline Del, Montgomery. It was created on his behalf by Sheron Nightingale, a trained medical scribe. The creation of this record is based on the scribe's personal observations and the provider's statements to them.   I have reviewed the above documentation for accuracy and completeness, and I agree with the above.

## 2017-02-28 ENCOUNTER — Telehealth: Payer: Self-pay | Admitting: Oncology

## 2017-02-28 NOTE — Telephone Encounter (Signed)
Spoke with patient re December 2019 lab/fu.

## 2017-04-12 ENCOUNTER — Encounter: Payer: Self-pay | Admitting: Oncology

## 2017-04-12 ENCOUNTER — Other Ambulatory Visit: Payer: Self-pay | Admitting: Oncology

## 2017-04-12 ENCOUNTER — Telehealth: Payer: Self-pay | Admitting: *Deleted

## 2017-04-12 NOTE — Progress Notes (Signed)
Received staff message regarding expensive copay for Exemestane.  Called patient to introduce myself and to see if she was available to apply for copay assistance over the phone for PAF. Patient states she can provide the needed information.  I enrolled patient in PAF. Patient approved for copay assistance. Advised patient she would receive an approval letter as well as the information to present to the pharmacy for assistance. The portal would not allow me to print the approval letter and POE immediately. Patient thanked me and states she would call the pharmacy back to be sure the price she was quoted was correct and call me back to let me know. Advised patient the amount of the grant is $4,000 and must be renewed each year. Patient verbalized understanding and has my name and number for any additional financial questions or concerns.

## 2017-04-12 NOTE — Progress Notes (Unsigned)
Patient ID: [name], female   DOB: 18-Aug-1945, 72 y.o.   MRN: [id number] ID: [name] OB: Aug 05, 1945  MR#: [id number]  QPY#:[id number]  PCP: Cari Caraway, MD GYN:   SU: Alphonsa Overall OTHER MD: Gery Pray, Johnnette Litter  CHIEF COMPLAINT:  DCIS  TREATMENT: Exemestane  HISTORY OF PRESENT ILLNESS: From the original intake note:  [name] had routine screening mammography at the [facility] in [place] 11/27/2012 showing new calcifications in the left breast upper outer quadrant. Additional views 11/28/2012 showed a 1.4 cm cluster of pleomorphic calcifications, for which biopsy was recommended. 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  Bilateral breast MRI 12/19/2012 showed only post biopsy change.  The patient's subsequent history is as detailed below  INTERVAL HISTORY: [name] returns today for follow-up and treatment of her ductal carcinoma in situ, estrogen receptor positive.  She continues on exemestane with good tolerance. She notes that when she is tired, she won't take the tamoxifen because it will cause her to be more fatigued. She denies hot flashes and vaginal dryness.  Since [name]'s last visit to the office, she underwent diagnostic bilateral mammography with CAD and tomography on 02/15/2017 at [facility] showing no mammographic evidence of malignancy.  REVIEW OF SYSTEMS: [name] reports that her brother moved with her recently due to  Medical issues. She also notes that her husband is also ill with medical problems. She notes that driving her brother and her husband to medical appointments has been very stressful and exaughsting for her. She reports that she is still working as a Brewing technologist and goes to [employer] shows with her husband on the weekends sometimes. She reports that she also goes to church  and spends time with her grandchildren as much as she can. For exercise, she walks a total of half a mile down her driveway. She denies unusual headaches, visual changes, nausea, vomiting, or dizziness. There has been no unusual cough, phlegm production, or pleurisy. This been no change in bowel or bladder habits. She denies unexplained fatigue or unexplained weight loss, bleeding, rash, or fever. A detailed review of systems was otherwise stable.   PAST MEDICAL HISTORY: Past Medical History:  Diagnosis Date  . Bell's palsy    Resolved  . Breast cancer ([place]) 12/13/2012   left uoq- DCIS  . Diabetes ([place])   . Edema leg   . High cholesterol   . History of pulmonary embolism 2007   x 9  . History of radiation therapy 02/01/13-03/23/13   left breast 50.4 gray, lumpectomy cavity boosted to 60.4 gray  . Hypertension   . 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 Personal history of radiation therapy   . Pneumonia   . PONV (postoperative nausea and vomiting)    still got sick 1/14-may need scop patch  . Pulmonary embolism ([place])   . Right knee DJD   . Sleep apnea    uses a cpap  . Subcutaneous hematoma left leg just outside knee joint 04/10/2012   Culture was done of this fluid collection is pending  Gram stain had white cells but no organism  . Thyroid disease   . Wears glasses   . Wears partial dentures    upper partial    PAST SURGICAL HISTORY: Past Surgical History:  Procedure Laterality Date  . ABDOMINAL HYSTERECTOMY  1981   with unilateral slapingo-oophorectomy  . BREAST LUMPECTOMY  Left 2014  . BREAST LUMPECTOMY WITH NEEDLE LOCALIZATION Left 12/28/2012   Procedure: BREAST LUMPECTOMY WITH NEEDLE LOCALIZATION;  Surgeon: Shann Medal, MD;  Location: [facility];  Service: General;  Laterality: Left;  . KNEE ARTHROPLASTY  2007   left  . THYROIDECTOMY  2010  . TOTAL KNEE ARTHROPLASTY  04/10/2012   Procedure: TOTAL KNEE ARTHROPLASTY;  Surgeon: Lorn Junes, MD;  Location: [facility];   Service: Orthopedics;  Laterality: Right;    FAMILY HISTORY Family History  Problem Relation Age of Onset  . Hypertension Mother   . Heart attack Mother   . Heart disease Mother        before age 14  . Other Mother        varicose veins  . Cancer Father        kidney  . Diabetes Father   . Heart disease Father   . Hyperlipidemia Father   . Hypertension Father   . Diabetes Son   . Hyperlipidemia Son   . Hypertension Son   . Other Son        varicose veins  . Breast cancer Paternal Aunt    the patient's father died from a heart attack at age 70. The patient's mother died in an automobile accident at age 24. The patient had one brother and one sister. [name] did have renal cell carcinoma diagnosed at age 14, cured with surgery. There is no history of breast or ovarian cancer in the family to the patient's knowledge.  GYNECOLOGIC HISTORY:  Menarche age 43, first live birth age 41, the patient is [place] P4. She underwent hysterectomy with unilateral salpingo-oophorectomy in 1981. She did not take hormone replacement.  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 Her husband [name] (goes by [employer]") was a Restaurant manager, fast food. Both Bill and [name] are now [employer]. They exhibit locally. Their son [name]. Scott lives in [place] and works in [place]. Son [name] lives in [place] and works for [employer] long care. Son [name] lives in [place] as a Freight forwarder for [employer]. Son [name] also lives in [place] and is a Conservation officer, historic buildings. The patient has 11 grandchildren. She attends a [employer] in [place].    ADVANCED DIRECTIVES: In place   HEALTH MAINTENANCE: Social History   Tobacco Use  . Smoking status: Never Smoker  . Smokeless tobacco: Never Used  Substance Use Topics  . Alcohol use: No  . Drug use: No     Colonoscopy: 2012/ [facility]  PAP: 2009  Bone density:  Lipid panel:  No Known Allergies  Current Outpatient  Medications  Medication Sig Dispense Refill  . ACCU-CHEK AVIVA PLUS test strip     . aspirin EC 81 MG tablet Take 81 mg by mouth daily.    . celecoxib (CELEBREX) 200 MG capsule Take 1 capsule (200 mg total) by mouth daily. For pain 30 capsule 0  . Cholecalciferol (VITAMIN D-3) 5000 UNITS TABS Take 5,000 Units by mouth daily.    . diclofenac sodium (VOLTAREN) 1 % GEL Apply topically as needed.    Marland Kitchen exemestane (AROMASIN) 25 MG tablet Take 1 tablet (25 mg total) every morning by mouth. 90 tablet 4  . levothyroxine (SYNTHROID, LEVOTHROID) 200 MCG tablet Take 200 mcg by mouth daily.    Marland Kitchen lisinopril (PRINIVIL,ZESTRIL) 10 MG tablet Take 10 mg by mouth daily.    . metFORMIN (GLUCOPHAGE) 1000 MG tablet Take 500-1,000 mg by  mouth 2 (two) times daily with a meal. 500mg  in the morning and 1000mg  at night    . simvastatin (ZOCOR) 40 MG tablet Take 40 mg by mouth daily.    Marland Kitchen zolpidem (AMBIEN) 10 MG tablet Take 1 tablet (10 mg total) by mouth at bedtime as needed for sleep. For sleep 30 tablet 0   No current facility-administered medications for this visit.    OBJECTIVE: Middle-aged white womanIn no acute distress   ECOG FS:1 - Symptomatic but completely ambulatory There were no vitals filed for this visit. There is no height or weight on file to calculate BMI.  Sclerae unicteric, EOMs intact Oropharynx clear and moist No cervical or supraclavicular adenopathy Lungs no rales or rhonchi Heart regular rate and rhythm Abd soft, nontender, positive bowel sounds MSK no focal spinal tenderness, no upper extremity lymphedema Neuro: nonfocal, well oriented, appropriate affect Breasts: The right breast is unremarkable.  The left breast is status post lumpectomy and radiation.  There is no evidence of local recurrence.  Both axilla are benign. LAB RESULTS:  CMP     Component Value Date/Time   NA 140 02/21/2017 1310   K 4.3 02/21/2017 1310   CL 102 04/12/2012 0600   CO2 26 02/21/2017 1310   GLUCOSE 131  02/21/2017 1310   BUN 29.2 (H) 02/21/2017 1310   CREATININE 1.2 (H) 02/21/2017 1310   CALCIUM 9.8 02/21/2017 1310   PROT 7.7 02/21/2017 1310   ALBUMIN 4.1 02/21/2017 1310   AST 17 02/21/2017 1310   ALT <15 02/21/2017 1310   ALKPHOS 62 02/21/2017 1310   BILITOT 0.84 02/21/2017 1310   GFRNONAA 63 (L) 11/29/2012 0815   GFRAA 73 (L) 11/29/2012 0815    I No results found for: SPEP  Lab Results  Component Value Date   WBC 6.7 02/21/2017   NEUTROABS 4.2 02/21/2017   HGB 13.8 02/21/2017   HCT 42.5 02/21/2017   MCV 83.5 02/21/2017   PLT 247 02/21/2017      Chemistry      Component Value Date/Time   NA 140 02/21/2017 1310   K 4.3 02/21/2017 1310   CL 102 04/12/2012 0600   CO2 26 02/21/2017 1310   BUN 29.2 (H) 02/21/2017 1310   CREATININE 1.2 (H) 02/21/2017 1310      Component Value Date/Time   CALCIUM 9.8 02/21/2017 1310   ALKPHOS 62 02/21/2017 1310   AST 17 02/21/2017 1310   ALT <15 02/21/2017 1310   BILITOT 0.84 02/21/2017 1310       No results found for: LABCA2  No components found for: LABCA125  No results for input(s): INR in the last 168 hours.  Urinalysis    Component Value Date/Time   COLORURINE YELLOW 04/10/2012 1136   APPEARANCEUR CLEAR 04/10/2012 1136   LABSPEC 1.025 04/10/2012 1136   PHURINE 6.0 04/10/2012 1136   GLUCOSEU NEGATIVE 04/10/2012 1136   HGBUR NEGATIVE 04/10/2012 1136   BILIRUBINUR NEGATIVE 04/10/2012 1136   KETONESUR NEGATIVE 04/10/2012 1136   PROTEINUR NEGATIVE 04/10/2012 1136   UROBILINOGEN 2.0 (H) 04/10/2012 1136   NITRITE NEGATIVE 04/10/2012 1136   LEUKOCYTESUR NEGATIVE 04/10/2012 1136    STUDIES: Since [name]'s last visit to the office, she underwent diagnostic bilateral mammography with CAD and tomography on 02/15/2017 at [facility] showing: Breast density category B. There was no mammographic evidence of malignancy.    ASSESSMENT: 72 y.o. [name], [place] woman status post left breast upper outer quadrant biopsy  12/13/2012 for a ductal carcinoma in situ,  grade 1, estrogen receptor 100% positive, progesterone receptor 44% positive  (1) status post left lumpectomy 12/28/2012 showing a 9 mm area of ductal carcinoma in situ, low-grade, with close but negative margins.  (2) adjuvant radiation completed 03/23/2013  (3) started anastrozole January 2015, stopped after a few weeks because of intolerance (fatigue); started exemestane June 2015, now taken every Monday Wednesday and Friday  (4) Mild osteopenia, with bone density 01/26/2013 showing a T score of -1.1  (a) repeat bone density October 2017 stable  (5) obesity  (6) DM II   PLAN: [place] is now 4 years out from definitive surgery for her breast cancer with no evidence of disease recurrence.  This is very favorable.  She is taking the exemestane in a very irregular fashion but she is taking it at least some.  We have to believe this will have some effect on reducing her breast cancer recurrence risk.  In any case she will see me again in 1 year.  At that time she will be ready to "graduate".  In the meantime I have encouraged her to "do something for the caregiver", between work and taking care of her husband and brother she is neglecting herself and this really can lead to negative consequences long-term  She knows to call for any other issues that may develop before the next visit.  Magrinat, Jacqueline Dad, MD  04/12/17 4:57 PM Medical Oncology and Hematology [facility] [address] [place], [place] [address] Tel. [phone]    Fax. [phone]  This document serves as a record of services personally performed by Lurline Del, MD. It was created on his behalf by Sheron Nightingale, a trained medical scribe. The creation of this record is based on the scribe's personal observations and the provider's statements to them.   I have reviewed the above documentation for accuracy and completeness, and I agree with the above.

## 2017-04-12 NOTE — Telephone Encounter (Signed)
If she can't afford exemestane, can give letrozole a try  She only has a year to go  Thanks!  GM

## 2017-04-12 NOTE — Telephone Encounter (Signed)
Per VM from pt stating she went to pickup her exemestane with cost of $253 for 30 day supply.  Jemma states medication has not cost this much previously.  This RN returned call to pt and obtained identified VM- this RN left message stating her concern will be sent to MD and our Managed Care department for possible need to do an insurance override for continued therapy for continuity of care.  This RN requested a return call to this RN to give current pills she has on hand.  This message will be sent to MD and Managed Care.

## 2017-04-13 ENCOUNTER — Encounter: Payer: Self-pay | Admitting: Oncology

## 2017-04-13 ENCOUNTER — Telehealth: Payer: Self-pay

## 2017-04-13 NOTE — Telephone Encounter (Signed)
After hours telephone communication received from pt stating her Exemestane is $253 and she cannot afford.  In basket msg sent to [place] and Lenise in [place].

## 2017-04-13 NOTE — Progress Notes (Signed)
Called patient back to confirm price she was quoted for Exemestane was correct. She states it was only because of her deductible and the medication tier changed. Patient states it would be lower after the first fill.  Advised patient I would call the pharmacy to provide the rx information for PAF for them to bill the foundation and advise her of the status.  Called [facility]([place] to provide the information and she states it went through and left no balance for the patient.  Called the patient back to advise and she was very appreciative and asked for my manager's contact information for my being so helpful. Gave her the business address for [employer]. She has my contact name and number for any additional financial questions or concerns.

## 2017-04-29 ENCOUNTER — Encounter: Payer: Self-pay | Admitting: Oncology

## 2017-04-29 NOTE — Progress Notes (Signed)
Was able to print PAF copay assistance documents from portal for Exemestane(Aromasin). Obtained physician signature and uploaded to portal. Printed award letter, POE;etc and placed in copay book.

## 2017-10-13 ENCOUNTER — Encounter (HOSPITAL_COMMUNITY): Payer: Self-pay | Admitting: *Deleted

## 2017-10-13 ENCOUNTER — Emergency Department (HOSPITAL_COMMUNITY): Payer: Medicare Other

## 2017-10-13 ENCOUNTER — Emergency Department (HOSPITAL_COMMUNITY)
Admission: EM | Admit: 2017-10-13 | Discharge: 2017-10-13 | Disposition: A | Discharge status: Home / Self Care | Payer: Medicare Other | Attending: Emergency Medicine | Admitting: Emergency Medicine

## 2017-10-13 DIAGNOSIS — Z91040 Latex allergy status: Secondary | ICD-10-CM | POA: Insufficient documentation

## 2017-10-13 DIAGNOSIS — Y939 Activity, unspecified: Secondary | ICD-10-CM | POA: Diagnosis not present

## 2017-10-13 DIAGNOSIS — I10 Essential (primary) hypertension: Secondary | ICD-10-CM | POA: Insufficient documentation

## 2017-10-13 DIAGNOSIS — E119 Type 2 diabetes mellitus without complications: Secondary | ICD-10-CM | POA: Insufficient documentation

## 2017-10-13 DIAGNOSIS — Y999 Unspecified external cause status: Secondary | ICD-10-CM | POA: Diagnosis not present

## 2017-10-13 DIAGNOSIS — Y929 Unspecified place or not applicable: Secondary | ICD-10-CM | POA: Insufficient documentation

## 2017-10-13 DIAGNOSIS — W109XXA Fall (on) (from) unspecified stairs and steps, initial encounter: Secondary | ICD-10-CM | POA: Diagnosis not present

## 2017-10-13 DIAGNOSIS — Z79899 Other long term (current) drug therapy: Secondary | ICD-10-CM | POA: Diagnosis not present

## 2017-10-13 DIAGNOSIS — W19XXXA Unspecified fall, initial encounter: Secondary | ICD-10-CM

## 2017-10-13 DIAGNOSIS — S93601A Unspecified sprain of right foot, initial encounter: Secondary | ICD-10-CM | POA: Diagnosis present

## 2017-10-13 DIAGNOSIS — S0230XA Fracture of orbital floor, unspecified side, initial encounter for closed fracture: Secondary | ICD-10-CM

## 2017-10-13 MED ORDER — OXYCODONE-ACETAMINOPHEN 5-325 MG PO TABS: 2.0000 | ORAL_TABLET | Freq: Four times a day (QID) | ORAL | 0 refills | Status: AC | PRN

## 2017-10-13 MED ORDER — ACETAMINOPHEN 500 MG PO TABS
1000.0000 mg | ORAL_TABLET | Freq: Once | ORAL | Status: AC
  Administered 2017-10-13: 1000 mg via ORAL
  Filled 2017-10-13: qty 2

## 2017-10-13 MED ORDER — OXYCODONE-ACETAMINOPHEN 5-325 MG PO TABS: 2.0000 | ORAL_TABLET | Freq: Four times a day (QID) | ORAL | 0 refills | Status: DC | PRN

## 2017-10-13 NOTE — Discharge Instructions (Addendum)
CT scan showed an orbital floor fracture on the left side. I have spoken with the ENT doctor (see below) about you and he wants you to give his office a call so you can follow-up with him.  Apply ice to your face to help with swelling. No nose blowing.  I have prescribed you medication for the pain.  I am sorry that this happened to you. I wish you a swift recovery.

## 2017-10-13 NOTE — ED Notes (Signed)
Patient transported to CT 

## 2017-10-13 NOTE — ED Provider Notes (Signed)
[facility] EMERGENCY DEPARTMENT Provider Note  CSN: [id number] Arrival date & time: 10/13/17  0254  History   Chief Complaint Chief Complaint  Patient presents with  . Fall   HPI [name] is a 72 y.o. female with a medical history of Type 2 DM, PE, breast cancer and HTN who presented to the ED following a fall. Patient fell face first after tripping on a tile after leaving the Heart and Vascular wing of the hospital. She currently endorses facial pain. Denies LOC or AMS/confusion following the fall. Denies facial droop, slurred speech, headache, paresthesias, weakness or gait/coordination/balance issues. Patient denies physical symptoms prior to fall such as chest pain, paresthesias, SOB, palpitations, dizziness/lightheadedness or vision changes.  Past Medical History:  Diagnosis Date  . Bell's palsy    Resolved  . 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 Diabetes ([place])   . Edema leg   . High cholesterol   . History of pulmonary embolism 2007   x 9  . History of radiation therapy 02/01/13-03/23/13   left breast 50.4 gray, lumpectomy cavity boosted to 60.4 gray  . Hypertension   . 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 Personal history of radiation therapy   . Pneumonia   . PONV (postoperative nausea and vomiting)    still got sick 1/14-may need scop patch  . Pulmonary embolism ([place])   . Right knee DJD   . Sleep apnea    uses a cpap  . Subcutaneous hematoma left leg just outside knee joint 04/10/2012   Culture was done of this fluid collection is pending  Gram stain had white cells but no organism  . Thyroid disease   . Wears glasses   . Wears partial dentures    upper partial    Patient Active Problem List   Diagnosis Date Noted  . Diabetes mellitus type 2, uncomplicated ([place]) 27/09/2374  . Malignant neoplasm of upper-outer quadrant of left breast in female, estrogen receptor positive ([place]) 12/20/2012  . Anxiety 04/12/2012  . BMI 45.0-49.9, adult  ([place]) 04/11/2012  . Obesity, Class III, BMI 40-49.9 (morbid obesity) ([place]) 04/11/2012  . 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 Chronic low back pain 04/10/2012  . Right knee DJD   . Diabetes ([place])   . History of pulmonary embolism   . Hypertension   . High cholesterol   . Pulmonary embolism ([place])   . Pneumonia   . Bell's palsy   . Sleep apnea     Past Surgical History:  Procedure Laterality Date  . ABDOMINAL HYSTERECTOMY  1981   with unilateral slapingo-oophorectomy  . BREAST LUMPECTOMY Left 2014  . BREAST LUMPECTOMY WITH NEEDLE LOCALIZATION Left 12/28/2012   Procedure: BREAST LUMPECTOMY WITH NEEDLE LOCALIZATION;  Surgeon: Shann Medal, MD;  Location: [facility];  Service: General;  Laterality: Left;  . KNEE ARTHROPLASTY  2007   left  . THYROIDECTOMY  2010  . TOTAL KNEE ARTHROPLASTY  04/10/2012   Procedure: TOTAL KNEE ARTHROPLASTY;  Surgeon: Lorn Junes, MD;  Location: [facility];  Service: Orthopedics;  Laterality: Right;     OB History    Gravida  4   Para  4   Term      Preterm      AB      Living        SAB      TAB      Ectopic      Multiple  Live Births           Obstetric Comments  Menarche age 75, first live birth age 10, the patient is GX P4. She underwent hysterectomy with unilateral salpingo-oophorectomy in 1981. She did not take hormone replacement.         Home Medications    Prior to Admission medications   Medication Sig Start Date End Date Taking? Authorizing Provider  ACCU-CHEK AVIVA PLUS test strip  12/08/12   [provider]  aspirin EC 81 MG tablet Take 81 mg by mouth daily.    [provider]  celecoxib (CELEBREX) 200 MG capsule Take 1 capsule (200 mg total) by mouth daily. For pain 04/12/12   Shepperson, Kirstin, PA-C  Cholecalciferol (VITAMIN D-3) 5000 UNITS TABS Take 5,000 Units by mouth daily.    [provider]  diclofenac sodium (VOLTAREN) 1 % GEL Apply topically as needed.    Cari Caraway, MD  exemestane (AROMASIN) 25 MG tablet Take 1 tablet (25 mg total) every morning by mouth. 02/21/17   Magrinat, Virgie Dad, MD  levothyroxine (SYNTHROID, LEVOTHROID) 200 MCG tablet Take 200 mcg by mouth daily. 12/03/11   [provider]  lisinopril (PRINIVIL,ZESTRIL) 10 MG tablet Take 10 mg by mouth daily. 02/01/12   [provider]  metFORMIN (GLUCOPHAGE) 1000 MG tablet Take 500-1,000 mg by mouth 2 (two) times daily with a meal. 500mg  in the morning and 1000mg  at night 12/01/11   [provider]  oxyCODONE-acetaminophen (PERCOCET/ROXICET) 5-325 MG tablet Take 2 tablets by mouth every 6 (six) hours as needed for up to 5 days for severe pain. 10/13/17 10/18/17  Darling Cieslewicz, Alvie Heidelberg I, PA-C  simvastatin (ZOCOR) 40 MG tablet Take 40 mg by mouth daily. 12/01/11   [provider]  zolpidem (AMBIEN) 10 MG tablet Take 1 tablet (10 mg total) by mouth at bedtime as needed for sleep. For sleep 04/12/12   Matthew Saras, PA-C    Family History Family History  Problem Relation Age of Onset  . Hypertension Mother   . Heart attack Mother   . Heart disease Mother        before age 60  . Other Mother        varicose veins  . Cancer Father        kidney  . Diabetes Father   . Heart disease Father   . Hyperlipidemia Father   . Hypertension Father   . Diabetes Son   . Hyperlipidemia Son   . Hypertension Son   . Other Son        varicose veins  . Breast cancer Paternal Aunt     Social History Social History   Tobacco Use  . Smoking status: Never Smoker  . Smokeless tobacco: Never Used  Substance Use Topics  . Alcohol use: No  . Drug use: No     Allergies   Patient has no known allergies.   Review of Systems Review of Systems  Constitutional: Negative.   HENT: Positive for facial swelling and nosebleeds.   Eyes: Negative for photophobia, pain and visual disturbance.  Musculoskeletal: Positive for arthralgias. Negative for gait problem, joint  swelling, neck pain and neck stiffness.  Skin: Positive for wound.  Neurological: Negative for dizziness, facial asymmetry, speech difficulty, weakness, light-headedness, numbness and headaches.     Physical Exam Updated Vital Signs BP (!) 150/79 (BP Location: Right Arm)   Pulse 75   Temp 98 F (36.7 C) (Oral)   Resp 16  SpO2 98%   Physical Exam  Constitutional: She appears well-developed and well-nourished.  HENT:  Head: Normocephalic. Head is without raccoon's eyes, without Battle's sign, without right periorbital erythema and without left periorbital erythema.    Nose: No epistaxis.  Mouth/Throat: Oropharynx is clear and moist and mucous membranes are normal. Lacerations present.    Dried blood in left nare and on upper lip. No active epistaxis. Swelling and tenderness along left maxillary bone. Beginning of a blue ecchymosis is appreciated.  2cm laceration/skin avulsion on left upper lip. Dried blood around area.  Eyes: Pupils are equal, round, and reactive to light. Conjunctivae, EOM and lids are normal. Right conjunctiva has no hemorrhage. Left conjunctiva has no hemorrhage.  Neck: Normal range of motion and full passive range of motion without pain. Neck supple. No spinous process tenderness and no muscular tenderness present. Normal range of motion present.  Musculoskeletal: Normal range of motion.  Skin: Skin is warm. Capillary refill takes less than 2 seconds. Abrasion noted. She is not diaphoretic. No pallor.  Minor abrasion on left elbow.  Nursing note and vitals reviewed.    ED Treatments / Results  Labs (all labs ordered are listed, but only abnormal results are displayed) Labs Reviewed - No data to display  EKG None  Radiology Ct Maxillofacial Wo Contrast  Result Date: 10/13/2017 CLINICAL DATA:  Fall.  Blunt trauma face.  Laceration. EXAM: CT MAXILLOFACIAL WITHOUT CONTRAST TECHNIQUE: Multidetector CT imaging of the maxillofacial structures was  performed. Multiplanar CT image reconstructions were also generated. COMPARISON:  None. FINDINGS: Osseous: Blowout fracture left orbital floor posteriorly. Inferior rectus muscle protrudes into the defect as well as orbital fat. No other fracture in the face. Orbits: Gas in the soft tissues of the lower eyelid on the left due to orbital floor fracture. Negative for orbital mass or hematoma Sinuses: Mild mucosal edema in the maxillary sinus bilaterally. Small air-fluid level left maxillary sinus consistent with blood. Soft tissues: Soft tissue swelling over the left max left maxilla with gas in the soft tissues. Limited intracranial: Negative IMPRESSION: Orbital floor blowout fracture on the left with herniation of fat and inferior rectus through the defect. Considerable gas in the soft tissues of the left lower eyelid and maxilla. Small air-fluid level left maxillary sinus. No other fracture. Electronically Signed   By: Franchot Gallo M.D.   On: 10/13/2017 11:40    Procedures Procedures (including critical care time)  Medications Ordered in ED Medications  acetaminophen (TYLENOL) tablet 1,000 mg (1,000 mg Oral Given 10/13/17 1104)     Initial Impression / Assessment and Plan / ED Course  Triage vital signs and the nursing notes have been reviewed.  Pertinent labs & imaging results that were available during care of the patient were reviewed and considered in medical decision making (see chart for details).  Clinical Course as of Oct 14 1323  Thu Oct 13, 2017  1210 Orbital blowout fracture seen on CT with protrusion of inferior rectus muscle.   [GM]  1305 Case discussed with on-call ENT/maxiilofacial doctor, Lucia Gaskins. Patient is stable to follow-up with him as outpatient as there are no signs of entrapment on physical exam.    [GM]    Clinical Course User Index [GM] Kamari Buch, Jonelle Sports, PA-C   Patient presents after a mechanical fall. Given history, there is no concern for an acute  intracranial process such as hemorrhage that led to fall or was caused by the fall. Orbital floor fracture seen on CT scan, but  there are no signs of entrapment on physical exam which is reassuring. No need for emergent intervention. Wound on lip is skin avulsion and elbow abrasion does not require repair. Wounds were cleaned with sterile water and education provided on appropriate cleaning.  Final Clinical Impressions(s) / ED Diagnoses  1. Left Orbital Blowout Fracture. No signs of entrapment. Education provided on pain and symptom relief. Case discussed with ENT/maxillofacial who will see patient on outpatient basis. Education provided on OTC and supportive relief for pain. Pain medication prescribed.  Dispo: Home. After thorough clinical evaluation, this patient is determined to be medically stable and can be safely discharged with the previously mentioned treatment and/or outpatient follow-up/referral(s). At this time, there are no other apparent medical conditions that require further screening, evaluation or treatment.   Final diagnoses:  Fall, initial encounter  Orbital floor (blow-out) closed fracture [facility])    ED Discharge Orders        Ordered    oxyCODONE-acetaminophen (PERCOCET/ROXICET) 5-325 MG tablet  Every 6 hours PRN,   Status:  Discontinued     10/13/17 1309    oxyCODONE-acetaminophen (PERCOCET/ROXICET) 5-325 MG tablet  Every 6 hours PRN     10/13/17 1321        Eriel Dunckel, [place] I, PA-C 10/13/17 1325    Valarie Merino, MD 10/14/17 [phone]

## 2017-10-13 NOTE — ED Triage Notes (Signed)
Pt in from Heart & Vascular where witnessed by brother mechanical fall landing in hallway, was unresponsive for  Estimated 1 min, pt present to ED triage on stretcher on LSB & C collar in place, A&O x4, pt has lac to L upper lip, nose bleed that has subsided, pt has swelling to L eyebrow and lac to R elbow with bleeding controlled, & L knee with hx of bil knee replacement, A&O x4

## 2017-10-17 MED FILL — Medication: Qty: 1 | Status: AC

## 2017-11-23 ENCOUNTER — Telehealth: Payer: Self-pay | Admitting: Oncology

## 2017-11-23 NOTE — Telephone Encounter (Signed)
Faxing medical record to [facility] at [phone] JI:[id number]

## 2017-11-28 ENCOUNTER — Ambulatory Visit: Payer: Medicare Other | Admitting: Cardiology

## 2017-12-06 ENCOUNTER — Encounter: Payer: Self-pay | Admitting: *Deleted

## 2017-12-06 ENCOUNTER — Encounter: Payer: Self-pay | Admitting: Cardiology

## 2017-12-06 ENCOUNTER — Ambulatory Visit: Payer: Medicare Other | Admitting: Cardiology

## 2017-12-06 VITALS — BP 122/68 | HR 107 | Ht 66.0 in | Wt 283.6 lb

## 2017-12-06 DIAGNOSIS — G4733 Obstructive sleep apnea (adult) (pediatric): Secondary | ICD-10-CM

## 2017-12-06 DIAGNOSIS — E782 Mixed hyperlipidemia: Secondary | ICD-10-CM | POA: Diagnosis not present

## 2017-12-06 DIAGNOSIS — I10 Essential (primary) hypertension: Secondary | ICD-10-CM

## 2017-12-06 DIAGNOSIS — E119 Type 2 diabetes mellitus without complications: Secondary | ICD-10-CM | POA: Diagnosis not present

## 2017-12-06 DIAGNOSIS — R079 Chest pain, unspecified: Secondary | ICD-10-CM | POA: Diagnosis not present

## 2017-12-06 NOTE — Progress Notes (Signed)
Cardiology Office Note:    Date:  12/06/2017   ID:  [name], DOB Feb 25, 1946, MRN [id number]  PCP:  Jacqueline Caraway, MD  Cardiologist:  No primary care provider on file.   Referring MD: Jacqueline Caraway, MD     History of Present Illness:    [name] is a 72 y.o. female is here for the evaluation of chest pain at the request of [name].  She is a history of diabetes, pulmonary embolism, breast cancer, hypertension.  She fell face first after tripping on a tile after leaving the heart and vascular section of the hospital on 10/13/2017.  She had an orbital floor fracture seen on CT Case discussed with ENT, conservative management.    She has been doing quite well with her breast cancer, [name].  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  Her brother moved from [place] so she can help with his care, B-cell lymphoma, currently living in an apartment in remission.  He sees [name].  Working with the [facility].  Stressful.  Her son had a stroke.  She has episodes of left mid chest and left arm shooting discomfort, numbness for a few weeks duration. Would wake her up from deep sleep, quite severe, felt like a cramping.   No shortness of breath, no dizziness.  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  All of a sudden, the pain went away.  EKG on 03/01/2017 shows sinus rhythm with no other significant abnormalities.  Swim used to, 10 laps. Son stroke. Hard to walk with arthritis.   Mother had MI at 40 Father died in 82.   Past Medical History:  Diagnosis Date  . Bell's palsy    Resolved  . 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 Diabetes ([place])   . Edema leg   . High cholesterol   . History of pulmonary embolism 2007   x 9  . History of radiation therapy 02/01/13-03/23/13   left breast 50.4 gray, lumpectomy cavity boosted to 60.4 gray  . Hypertension   . 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 Personal history of  radiation therapy   . Pneumonia   . PONV (postoperative nausea and vomiting)    still got sick 1/14-may need scop patch  . Pulmonary embolism ([place])   . Right knee DJD   . Sleep apnea    uses a cpap  . Subcutaneous hematoma left leg just outside knee joint 04/10/2012   Culture was done of this fluid collection is pending  Gram stain had white cells but no organism  . Thyroid disease   . Wears glasses   . Wears partial dentures    upper partial    Past Surgical History:  Procedure Laterality Date  . ABDOMINAL HYSTERECTOMY  1981   with unilateral slapingo-oophorectomy  . BREAST LUMPECTOMY Left 2014  . BREAST LUMPECTOMY WITH NEEDLE LOCALIZATION Left 12/28/2012   Procedure: BREAST LUMPECTOMY WITH NEEDLE LOCALIZATION;  Surgeon: Jacqueline Medal, MD;  Location: [facility];  Service: General;  Laterality: Left;  . KNEE ARTHROPLASTY  2007   left  . THYROIDECTOMY  2010  . TOTAL KNEE ARTHROPLASTY  04/10/2012   Procedure: TOTAL KNEE ARTHROPLASTY;  Surgeon: Jacqueline Junes, MD;  Location: [facility];  Service: Orthopedics;  Laterality: Right;    Current Medications: Current Meds  Medication Sig  . ACCU-CHEK AVIVA  PLUS test strip   . celecoxib (CELEBREX) 200 MG capsule Take 1 capsule (200 mg total) by mouth daily. For pain (Patient taking differently: Take 200 mg by mouth as needed. For pain)  . Cholecalciferol (VITAMIN D-3) 5000 UNITS TABS Take 5,000 Units by mouth daily.  . diclofenac sodium (VOLTAREN) 1 % GEL Apply topically as needed.  Marland Kitchen exemestane (AROMASIN) 25 MG tablet Take 1 tablet (25 mg total) every morning by mouth.  . levothyroxine (SYNTHROID, LEVOTHROID) 200 MCG tablet Take 100 mcg by mouth daily. Take 2 pills Monday, 1 pill eveyrday after.  Marland Kitchen lisinopril (PRINIVIL,ZESTRIL) 10 MG tablet Take 10 mg by mouth daily.  . metFORMIN (GLUCOPHAGE) 1000 MG tablet Take 500-1,000 mg by mouth 2 (two) times daily with a meal. 500mg  in the morning and 1000mg  at night  . simvastatin (ZOCOR)  40 MG tablet Take 40 mg by mouth daily.  Marland Kitchen zolpidem (AMBIEN) 10 MG tablet Take 1 tablet (10 mg total) by mouth at bedtime as needed for sleep. For sleep     Allergies:   Patient has no known allergies.   Social History   Socioeconomic History  . Marital status: Married    Spouse name: Not on file  . Number of children: 4  . Years of education: Not on file  . Highest education level: Not on file  Occupational History  . Occupation: Retired    Comment: Customer service manager  . Occupation: Brewing technologist  Social Needs  . Financial resource strain: Not on file  . Food insecurity:    Worry: Not on file    Inability: Not on file  . Transportation needs:    Medical: Not on file    Non-medical: Not on file  Tobacco Use  . Smoking status: Never Smoker  . Smokeless tobacco: Never Used  Substance and Sexual Activity  . Alcohol use: No  . Drug use: No  . Sexual activity: Not on file    Comment: menarche age 76, 57st live birth age 18, P52, no HRT  Lifestyle  . Physical activity:    Days per week: Not on file    Minutes per session: Not on file  . Stress: Not on file  Relationships  . Social connections:    Talks on phone: Not on file    Gets together: Not on file    Attends religious service: Not on file    Active member of club or organization: Not on file    Attends meetings of clubs or organizations: Not on file    Relationship status: Not on file  Other Topics Concern  . Not on file  Social History Narrative  . Not on file     Family History: The patient's family history includes Breast cancer in her paternal aunt; Cancer in her father; Diabetes in her father and son; Heart attack in her mother; Heart disease in her father and mother; Hyperlipidemia in her father and son; Hypertension in her father, mother, and son; Other in her mother and son.  ROS:   Please see the history of present illness.    Denies any fevers chills nausea vomiting syncope bleeding.  All other systems reviewed and are  negative.  EKGs/Labs/Other Studies Reviewed:    The following studies were reviewed today:  prior office notes reviewed, lab work reviewed, EKG reviewed creatinine 0.96 TSH 1.2, hemoglobin 14.1  EKG: Prior EKG as above, normal sinus rhythm with no other significant abnormalities  Recent Labs: 02/21/2017: ALT <15; BUN 29.2; Creatinine 1.2;  HGB 13.8; Platelets 247; Potassium 4.3; Sodium 140  Recent Lipid Panel No results found for: CHOL, TRIG, HDL, CHOLHDL, VLDL, LDLCALC, LDLDIRECT  Physical Exam:    VS:  BP 122/68   Pulse (!) 107   Ht 5\' 6"  (1.676 m)   Wt 283 lb 9.6 oz (128.6 kg)   SpO2 98%   BMI 45.77 kg/m     Wt Readings from Last 3 Encounters:  12/06/17 283 lb 9.6 oz (128.6 kg)  02/21/17 271 lb 11.2 oz (123.2 kg)  02/12/16 278 lb 6.4 oz (126.3 kg)     GEN:  Well nourished, well developed in no acute distress, obese HEENT: Normal NECK: No JVD; No carotid bruits LYMPHATICS: No lymphadenopathy CARDIAC: RRR, no murmurs, rubs, gallops RESPIRATORY:  Clear to auscultation without rales, wheezing or rhonchi  ABDOMEN: Soft, non-tender, non-distended MUSCULOSKELETAL: Chronic appearing lower extremity edema; No deformity  SKIN: Warm and dry NEUROLOGIC:  Alert and oriented x 3 PSYCHIATRIC:  Normal affect   ASSESSMENT:    1. Diabetes mellitus with coincident hypertension (HCC)   2. Chest pain, unspecified type   3. Morbid obesity ([place])   4. Mixed hyperlipidemia   5. Obstructive sleep apnea    PLAN:    In order of problems listed above:  Chest pain/left arm pain - Given her diabetes, age, family history with her mother having MI at age 72, we will go ahead and set her up for a pharmacologic stress test.  She is unable to walk the treadmill due to her severe arthritis.  We will follow-up with results of study.  Diabetes with hypertension -Currently under good control.  [name].  Hyperlipidemia - Simvastatin, no side effects of medication.  LDL 55.   Excellent.  Hypothyroidism - On Synthroid  Obstructive sleep apnea -Dr. Elenore Rota stable.  Morbid obesity - Continue to encourage weight loss.  She understands.  We discussed today.   Medication Adjustments/Labs and Tests Ordered: Current medicines are reviewed at length with the patient today.  Concerns regarding medicines are outlined above.  Orders Placed This Encounter  Procedures  . Myocardial Perfusion Imaging   No orders of the defined types were placed in this encounter.   Patient Instructions  Medication Instructions:  The current medical regimen is effective;  continue present plan and medications.  Testing/Procedures: You are being scheduled for a Lexiscan Myoview.  Please follow the instruction sheet given.  Follow-Up: Follow up will be based on the results of the above testing.  Thank you for choosing [facility]!!        Signed, Candee Furbish, MD  12/06/2017 4:12 PM    [facility]

## 2017-12-06 NOTE — Patient Instructions (Signed)
Medication Instructions:  The current medical regimen is effective;  continue present plan and medications.  Testing/Procedures: You are being scheduled for a Lexiscan Myoview.  Please follow the instruction sheet given.  Follow-Up: Follow up will be based on the results of the above testing.  Thank you for choosing [facility]!!

## 2017-12-12 ENCOUNTER — Telehealth (HOSPITAL_COMMUNITY): Payer: Self-pay | Admitting: *Deleted

## 2017-12-12 NOTE — Telephone Encounter (Signed)
Patient given detailed instructions per Myocardial Perfusion Study Information Sheet for the test on 12/14/17 at 1245. Patient notified to arrive 15 minutes early and that it is imperative to arrive on time for appointment to keep from having the test rescheduled.  If you need to cancel or reschedule your appointment, please call the office within 24 hours of your appointment. . Patient verbalized understanding.Teruko Joswick, Ranae Palms '

## 2017-12-14 ENCOUNTER — Ambulatory Visit (HOSPITAL_COMMUNITY): Payer: Medicare Other | Attending: Cardiovascular Disease

## 2017-12-14 DIAGNOSIS — R9439 Abnormal result of other cardiovascular function study: Secondary | ICD-10-CM | POA: Diagnosis not present

## 2017-12-14 DIAGNOSIS — E119 Type 2 diabetes mellitus without complications: Secondary | ICD-10-CM | POA: Diagnosis not present

## 2017-12-14 DIAGNOSIS — I10 Essential (primary) hypertension: Secondary | ICD-10-CM | POA: Diagnosis not present

## 2017-12-14 DIAGNOSIS — R079 Chest pain, unspecified: Secondary | ICD-10-CM | POA: Insufficient documentation

## 2017-12-14 DIAGNOSIS — Z8249 Family history of ischemic heart disease and other diseases of the circulatory system: Secondary | ICD-10-CM | POA: Diagnosis not present

## 2017-12-14 MED ORDER — REGADENOSON 0.4 MG/5ML IV SOLN
0.4000 mg | Freq: Once | INTRAVENOUS | Status: AC
  Administered 2017-12-14: 0.4 mg via INTRAVENOUS

## 2017-12-14 MED ORDER — TECHNETIUM TC 99M TETROFOSMIN IV KIT
31.3000 | PACK | Freq: Once | INTRAVENOUS | Status: AC | PRN
  Administered 2017-12-14: 31.3 via INTRAVENOUS
  Filled 2017-12-14: qty 32

## 2017-12-15 ENCOUNTER — Encounter (HOSPITAL_COMMUNITY): Payer: Medicare Other | Attending: Cardiovascular Disease

## 2017-12-15 LAB — MYOCARDIAL PERFUSION IMAGING
CSEPPHR: 109 {beats}/min
LV dias vol: 73 mL (ref 46–106)
LVSYSVOL: 25 mL
NUC STRESS TID: 1.01
Rest HR: 75 {beats}/min
SDS: 1
SRS: 0
SSS: 1

## 2017-12-15 MED ORDER — TECHNETIUM TC 99M TETROFOSMIN IV KIT
31.7000 | PACK | Freq: Once | INTRAVENOUS | Status: AC | PRN
  Administered 2017-12-15: 31.7 via INTRAVENOUS
  Filled 2017-12-15: qty 32

## 2018-01-10 ENCOUNTER — Other Ambulatory Visit: Payer: Self-pay | Admitting: Oncology

## 2018-01-10 DIAGNOSIS — Z853 Personal history of malignant neoplasm of breast: Secondary | ICD-10-CM

## 2018-02-21 ENCOUNTER — Ambulatory Visit
Admission: RE | Admit: 2018-02-21 | Discharge: 2018-02-21 | Disposition: A | Discharge status: Home / Self Care | Payer: Medicare Other | Source: Ambulatory Visit | Attending: Oncology | Admitting: Oncology

## 2018-02-21 DIAGNOSIS — Z853 Personal history of malignant neoplasm of breast: Secondary | ICD-10-CM

## 2018-03-05 NOTE — Progress Notes (Signed)
Patient ID: [name], female   DOB: 14-Jun-1945, 71 y.o.   MRN: [id number] ID: [name] OB: 14-Dec-1945  MR#: [id number]  OQH#:[id number]  PCP: Cari Caraway, MD GYN:   SU: Alphonsa Overall OTHER MD: Gery Pray, Johnnette Litter  CHIEF COMPLAINT:  DCIS  TREATMENT: Completing 5 years of antiestrogens   HISTORY OF PRESENT ILLNESS: From the original intake note:  [name] had routine screening mammography at the [facility] in [place] 11/27/2012 showing new calcifications in the left breast upper outer quadrant. Additional views 11/28/2012 showed a 1.4 cm cluster of pleomorphic calcifications, for which biopsy was recommended. 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  Bilateral breast MRI 12/19/2012 showed only post biopsy change.  The patient's subsequent history is as detailed below  INTERVAL HISTORY: [name] returns today for follow-up of her estrogen receptor positive ductal carcinoma in situ.  The patient continues on exemestane, is not having any side effects as long as she takes it with food. She takes with her metformin and after levothyroxine. Only complaint is cost of medication which is, 541 dollars per month, after speaking with nurse practitioner she applied for a grant to have medicine paid for. It was approved.  Currently she is receiving the medication at no cost  Since her last visit here, she had a digital diagnostic bilateral mammogram with CAD & tomography on 02/21/2018, showing no evidence of malignancy in either breast. Lumpectomy changes in the deep outer left breast. ACR Breast Density Category B: There are scattered areas of fibroglandular density.  REVIEW OF SYSTEMS: [name] reports that for exercise, before l current family issues was going to the pool frequently. Hasn't been back in  the pool since May.  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 She spent the last 4 days exhibiting her potter [place] [employer]. She denies unusual headaches, visual changes, nausea, vomiting, or dizziness. There has been no unusual cough, phlegm production, or pleurisy. This been no change in bowel or bladder habits. She denies unexplained weight loss, bleeding, rash, or fever. A detailed review of systems was otherwise stable.   PAST MEDICAL HISTORY: Past Medical History:  Diagnosis Date  . Bell's palsy    Resolved  . Breast cancer ([place]) 12/13/2012   left uoq- DCIS  . Diabetes ([place])   . Edema leg   . High cholesterol   . History of pulmonary embolism 2007   x 9  . History of radiation therapy 02/01/13-03/23/13   left breast 50.4 gray, lumpectomy cavity boosted to 60.4 gray  . Hypertension   . 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 Personal history of radiation therapy   . Pneumonia   . PONV (postoperative nausea and vomiting)    still got sick 1/14-may need scop patch  . Pulmonary embolism ([place])   . Right knee DJD   . Sleep apnea    uses a cpap  . Subcutaneous hematoma left leg just outside knee joint 04/10/2012   Culture was done of this fluid collection is pending  Gram stain had white cells but no organism  . Thyroid disease   . Wears glasses   . Wears partial dentures    upper partial    PAST SURGICAL HISTORY: Past Surgical History:  Procedure Laterality Date  . ABDOMINAL HYSTERECTOMY  1981   with unilateral slapingo-oophorectomy  . BREAST LUMPECTOMY Left 2014  .  BREAST LUMPECTOMY WITH NEEDLE LOCALIZATION Left 12/28/2012   Procedure: BREAST LUMPECTOMY WITH NEEDLE LOCALIZATION;  Surgeon: Shann Medal, MD;  Location: [facility];  Service: General;  Laterality: Left;  . KNEE ARTHROPLASTY  2007   left  . THYROIDECTOMY  2010  . TOTAL KNEE ARTHROPLASTY  04/10/2012   Procedure: TOTAL KNEE ARTHROPLASTY;  Surgeon: Lorn Junes, MD;  Location: [facility];  Service:  Orthopedics;  Laterality: Right;    FAMILY HISTORY Family History  Problem Relation Age of Onset  . Hypertension Mother   . Heart attack Mother   . Heart disease Mother        before age 2  . Other Mother        varicose veins  . Cancer Father        kidney  . Diabetes Father   . Heart disease Father   . Hyperlipidemia Father   . Hypertension Father   . Diabetes Son   . Hyperlipidemia Son   . Hypertension Son   . Other Son        varicose veins  . Breast cancer Paternal Aunt    the patient's father died from a heart attack at age 64. The patient's mother died in an automobile accident at age 66. The patient had one brother and one sister. [name] did have renal cell carcinoma diagnosed at age 66, cured with surgery. There is no history of breast or ovarian cancer in the family to the patient's knowledge.  GYNECOLOGIC HISTORY:  Menarche age 30, first live birth age 58, the patient is [name] P4. She underwent hysterectomy with unilateral salpingo-oophorectomy in 1981. She did not take hormone replacement.  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 Her husband Vanice Sarah Wessman III (goes by [employer]") was a Restaurant manager, fast food. Both [name] and [name] are now [employer]. They exhibit locally. Son [name] had massive stroke 7915, 71 years old. [name] is her sons primary care giver. Her son is still paralyzed from shoulder to hand. Rehab center helped him get up but, in house physical therapy unit has him ambulatory with walker. Can now control bodily function. Can speak but has short term memory loss. Husband is doing well even with hx of poor health.    ADVANCED DIRECTIVES: In place   HEALTH MAINTENANCE: Social History   Tobacco Use  . Smoking status: Never Smoker  . Smokeless tobacco: Never Used  Substance Use Topics  . Alcohol use: No  . Drug use: No     Colonoscopy: 2012/ [facility]  PAP: 2009  Bone density:  Lipid panel:  No Known Allergies  Current  Outpatient Medications  Medication Sig Dispense Refill  . ACCU-CHEK AVIVA PLUS test strip     . celecoxib (CELEBREX) 200 MG capsule Take 1 capsule (200 mg total) by mouth daily. For pain (Patient taking differently: Take 200 mg by mouth as needed. For pain) 30 capsule 0  . Cholecalciferol (VITAMIN D-3) 5000 UNITS TABS Take 5,000 Units by mouth daily.    . diclofenac sodium (VOLTAREN) 1 % GEL Apply topically as needed.    Marland Kitchen exemestane (AROMASIN) 25 MG tablet Take 1 tablet (25 mg total) every morning by mouth. 90 tablet 4  . levothyroxine (SYNTHROID, LEVOTHROID) 200 MCG tablet Take 100 mcg by mouth daily. Take 2 pills Monday, 1 pill eveyrday after.    Marland Kitchen lisinopril (PRINIVIL,ZESTRIL) 10 MG tablet Take 10 mg by mouth daily.    . metFORMIN (GLUCOPHAGE) 1000 MG tablet  Take 500-1,000 mg by mouth 2 (two) times daily with a meal. 500mg  in the morning and 1000mg  at night    . simvastatin (ZOCOR) 40 MG tablet Take 40 mg by mouth daily.    Marland Kitchen zolpidem (AMBIEN) 10 MG tablet Take 1 tablet (10 mg total) by mouth at bedtime as needed for sleep. For sleep 30 tablet 0   No current facility-administered medications for this visit.    OBJECTIVE: Middle-aged white woman who appears stated age  ECOG FS:1 - Symptomatic but completely ambulatory Vitals:   03/06/18 1343  BP: 135/75  Pulse: 84  Resp: 18  Temp: 97.6 F (36.4 C)  SpO2: 98%   Body mass index is 44.53 kg/m.  Sclerae unicteric, pupils round and equal Oropharynx clear and moist No cervical or supraclavicular adenopathy Lungs no rales or rhonchi Heart regular rate and rhythm Abd soft, nontender, positive bowel sounds MSK no focal spinal tenderness, no upper extremity lymphedema Neuro: nonfocal, well oriented, appropriate affect Breasts: The right breast is benign.  The left breast has undergone lumpectomy followed by radiation, with no evidence of disease recurrence.  Both axillae are benign.  LAB RESULTS:  CMP     Component Value  Date/Time   NA 140 02/21/2017 1310   K 4.3 02/21/2017 1310   CL 102 04/12/2012 0600   CO2 26 02/21/2017 1310   GLUCOSE 131 02/21/2017 1310   BUN 29.2 (H) 02/21/2017 1310   CREATININE 1.2 (H) 02/21/2017 1310   CALCIUM 9.8 02/21/2017 1310   PROT 7.7 02/21/2017 1310   ALBUMIN 4.1 02/21/2017 1310   AST 17 02/21/2017 1310   ALT <15 02/21/2017 1310   ALKPHOS 62 02/21/2017 1310   BILITOT 0.84 02/21/2017 1310   GFRNONAA 63 (L) 11/29/2012 0815   GFRAA 73 (L) 11/29/2012 0815    I No results found for: SPEP  Lab Results  Component Value Date   WBC 5.3 03/06/2018   NEUTROABS 3.1 03/06/2018   HGB 13.1 03/06/2018   HCT 40.2 03/06/2018   MCV 84.5 03/06/2018   PLT 208 03/06/2018      Chemistry      Component Value Date/Time   NA 140 02/21/2017 1310   K 4.3 02/21/2017 1310   CL 102 04/12/2012 0600   CO2 26 02/21/2017 1310   BUN 29.2 (H) 02/21/2017 1310   CREATININE 1.2 (H) 02/21/2017 1310      Component Value Date/Time   CALCIUM 9.8 02/21/2017 1310   ALKPHOS 62 02/21/2017 1310   AST 17 02/21/2017 1310   ALT <15 02/21/2017 1310   BILITOT 0.84 02/21/2017 1310       No results found for: LABCA2  No components found for: LABCA125  No results for input(s): INR in the last 168 hours.  Urinalysis    Component Value Date/Time   COLORURINE YELLOW 04/10/2012 1136   APPEARANCEUR CLEAR 04/10/2012 1136   LABSPEC 1.025 04/10/2012 1136   PHURINE 6.0 04/10/2012 1136   GLUCOSEU NEGATIVE 04/10/2012 1136   HGBUR NEGATIVE 04/10/2012 1136   BILIRUBINUR NEGATIVE 04/10/2012 1136   KETONESUR NEGATIVE 04/10/2012 1136   PROTEINUR NEGATIVE 04/10/2012 1136   UROBILINOGEN 2.0 (H) 04/10/2012 1136   NITRITE NEGATIVE 04/10/2012 1136   LEUKOCYTESUR NEGATIVE 04/10/2012 1136    STUDIES: Mm Diag Breast Tomo Bilateral  Result Date: 02/21/2018 CLINICAL DATA:  History of left breast cancer (DCIS), diagnosed in 2014.Routine annual exam. EXAM: DIGITAL DIAGNOSTIC BILATERAL MAMMOGRAM WITH CAD AND  TOMO COMPARISON:  Previous exam(s). ACR Breast Density Category b:  There are scattered areas of fibroglandular density. FINDINGS: There are lumpectomy changes in the deep outer left breast. No mass, nonsurgical distortion, or suspicious microcalcification is identified in either breast to suggest malignancy. Mammographic images were processed with CAD. IMPRESSION: No evidence of malignancy in either breast. Lumpectomy changes on the left. RECOMMENDATION: Diagnostic mammogram is suggested in 1 year. (Code:[id number]-B-01Y) I have discussed the findings and recommendations with the patient. Results were also provided in writing at the conclusion of the visit. If applicable, a reminder letter will be sent to the patient regarding the next appointment. BI-RADS CATEGORY  2: Benign. Electronically Signed   By: Curlene Dolphin M.D.   On: 02/21/2018 11:43    ASSESSMENT: 72 y.o. [name], [place] woman status post left breast upper outer quadrant biopsy 12/13/2012 for a ductal carcinoma in situ, grade 1, estrogen receptor 100% positive, progesterone receptor 44% positive  (1) status post left lumpectomy 12/28/2012 showing a 9 mm area of ductal carcinoma in situ, low-grade, with close but negative margins.  (2) adjuvant radiation completed 03/23/2013  (3) started anastrozole January 2015, stopped after a few weeks because of intolerance (fatigue); started exemestane June 2015, then took it every Monday Wednesday and Friday, eventually was able to take it daily so long as she took it with food  (4) Mild osteopenia, with bone density 01/26/2013 showing a T score of -1.1  (a) repeat bone density October 2017 stable  (5) obesity  (6) DM II   PLAN: Aleksis is now a little over 6 years out from definitive surgery for her breast cancer with no evidence of disease recurrence.  This is very favorable.  She will have been on antiestrogens 5 years as of January.  She did miss a few months initially and also took it every other day  for a while instead of daily.  Despite that, since she had noninvasive low-grade breast cancer, I am comfortable with her stopping exemestane when she runs out of the current supply.  I do not think she should pay $540 for an additional couple of months  Accordingly she is "graduating" today.  I am comfortable releasing her to her primary care physicians care.  All she will need in terms of breast cancer follow-up is a yearly mammography, preferably with tomography, and a yearly physician breast exam  I will be glad to see Jimeka at any point in the future if and when the need arises but as of now are making no further routine appointments for her here.   Magrinat, Virgie Dad, MD  03/06/18 2:19 PM Medical Oncology and Hematology [facility] [address] [place], [place] [address] Tel. [phone]    Fax. [phone]    Elie Goody, am acting as scribe for Dr. Virgie Dad. Magrinat.  I, Lurline Del MD, have reviewed the above documentation for accuracy and completeness, and I agree with the above.

## 2018-03-06 ENCOUNTER — Inpatient Hospital Stay: Payer: Medicare Other | Attending: Oncology | Admitting: Oncology

## 2018-03-06 ENCOUNTER — Inpatient Hospital Stay: Payer: Medicare Other

## 2018-03-06 VITALS — BP 135/75 | HR 84 | Temp 97.6°F | Resp 18 | Ht 66.0 in | Wt 275.9 lb

## 2018-03-06 DIAGNOSIS — Z86000 Personal history of in-situ neoplasm of breast: Secondary | ICD-10-CM

## 2018-03-06 DIAGNOSIS — M8580 Other specified disorders of bone density and structure, unspecified site: Secondary | ICD-10-CM | POA: Diagnosis not present

## 2018-03-06 DIAGNOSIS — E119 Type 2 diabetes mellitus without complications: Secondary | ICD-10-CM | POA: Diagnosis not present

## 2018-03-06 DIAGNOSIS — Z170 Estrogen receptor positive status [ER+]: Principal | ICD-10-CM

## 2018-03-06 DIAGNOSIS — C50412 Malignant neoplasm of upper-outer quadrant of left female breast: Secondary | ICD-10-CM

## 2018-03-06 DIAGNOSIS — E669 Obesity, unspecified: Secondary | ICD-10-CM

## 2018-03-06 LAB — COMPREHENSIVE METABOLIC PANEL
ALT: 12 U/L (ref 0–44)
AST: 14 U/L — AB (ref 15–41)
Albumin: 3.9 g/dL (ref 3.5–5.0)
Alkaline Phosphatase: 79 U/L (ref 38–126)
Anion gap: 9 (ref 5–15)
BILIRUBIN TOTAL: 1 mg/dL (ref 0.3–1.2)
BUN: 21 mg/dL (ref 8–23)
CHLORIDE: 104 mmol/L (ref 98–111)
CO2: 28 mmol/L (ref 22–32)
CREATININE: 1.07 mg/dL — AB (ref 0.44–1.00)
Calcium: 9.5 mg/dL (ref 8.9–10.3)
GFR calc Af Amer: >60 mL/min (ref >60)
GFR, EST NON AFRICAN AMERICAN: 52 mL/min — AB (ref >60)
Glucose, Bld: 209 mg/dL — ABNORMAL HIGH (ref 70–99)
Potassium: 4.3 mmol/L (ref 3.5–5.1)
Sodium: 141 mmol/L (ref 135–145)
TOTAL PROTEIN: 7 g/dL (ref 6.5–8.1)

## 2018-03-06 LAB — CBC WITH DIFFERENTIAL/PLATELET
Abs Immature Granulocytes: 0.01 10*3/uL (ref 0.00–0.07)
BASOS ABS: 0.1 10*3/uL (ref 0.0–0.1)
BASOS PCT: 1 %
EOS ABS: 0.1 10*3/uL (ref 0.0–0.5)
EOS PCT: 3 %
HCT: 40.2 % (ref 36.0–46.0)
Hemoglobin: 13.1 g/dL (ref 12.0–15.0)
Immature Granulocytes: 0 %
LYMPHS PCT: 29 %
Lymphs Abs: 1.5 10*3/uL (ref 0.7–4.0)
MCH: 27.5 pg (ref 26.0–34.0)
MCHC: 32.6 g/dL (ref 30.0–36.0)
MCV: 84.5 fL (ref 80.0–100.0)
MONO ABS: 0.5 10*3/uL (ref 0.1–1.0)
Monocytes Relative: 10 %
Neutro Abs: 3.1 10*3/uL (ref 1.7–7.7)
Neutrophils Relative %: 57 %
PLATELETS: 208 10*3/uL (ref 150–400)
RBC: 4.76 MIL/uL (ref 3.87–5.11)
RDW: 13.5 % (ref 11.5–15.5)
WBC: 5.3 10*3/uL (ref 4.0–10.5)
nRBC: 0 % (ref 0.0–0.2)

## 2018-03-08 ENCOUNTER — Telehealth: Payer: Self-pay | Admitting: Oncology

## 2018-03-08 NOTE — Telephone Encounter (Signed)
No 12/3 los or referrals.  °

## 2018-03-16 ENCOUNTER — Other Ambulatory Visit: Payer: Self-pay | Admitting: *Deleted

## 2018-05-26 ENCOUNTER — Other Ambulatory Visit: Payer: Self-pay | Admitting: Family Medicine

## 2018-05-26 DIAGNOSIS — M8580 Other specified disorders of bone density and structure, unspecified site: Secondary | ICD-10-CM

## 2018-05-26 DIAGNOSIS — M85851 Other specified disorders of bone density and structure, right thigh: Secondary | ICD-10-CM

## 2018-07-27 ENCOUNTER — Other Ambulatory Visit: Payer: Medicare Other

## 2018-09-25 ENCOUNTER — Ambulatory Visit
Admission: RE | Admit: 2018-09-25 | Discharge: 2018-09-25 | Disposition: A | Discharge status: Home / Self Care | Payer: Medicare Other | Source: Ambulatory Visit | Attending: Family Medicine | Admitting: Family Medicine

## 2018-09-25 ENCOUNTER — Other Ambulatory Visit: Payer: Self-pay

## 2018-09-25 DIAGNOSIS — M8580 Other specified disorders of bone density and structure, unspecified site: Secondary | ICD-10-CM

## 2018-09-25 DIAGNOSIS — M85851 Other specified disorders of bone density and structure, right thigh: Secondary | ICD-10-CM

## 2018-11-16 IMAGING — CT CT MAXILLOFACIAL W/O CM
3 series · 16 of 47 positions shown, 19 images · non-contrast
Comparison: None.

CLINICAL DATA: Fall.  Blunt trauma face.  Laceration.

EXAM:
CT MAXILLOFACIAL WITHOUT CONTRAST
TECHNIQUE: Multidetector CT imaging of the maxillofacial structures was
performed. Multiplanar CT image reconstructions were also generated.

[Series 3: facialbone 2.0 st · axial · 0.39mm/px · z∈[-270,-98]mm · 10 of 100 slices shown, 13 images]
[im 7/100  brain]
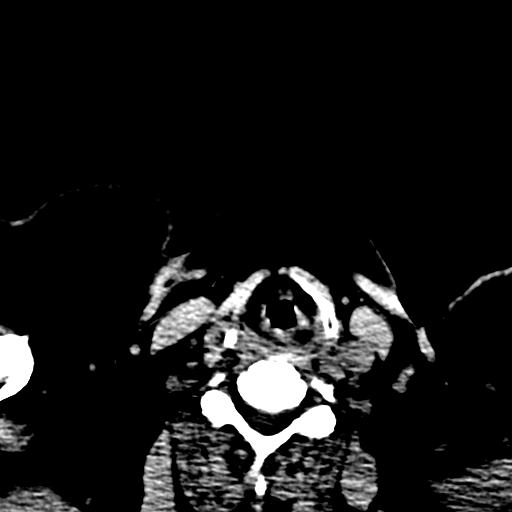
[im 7/100  bone]
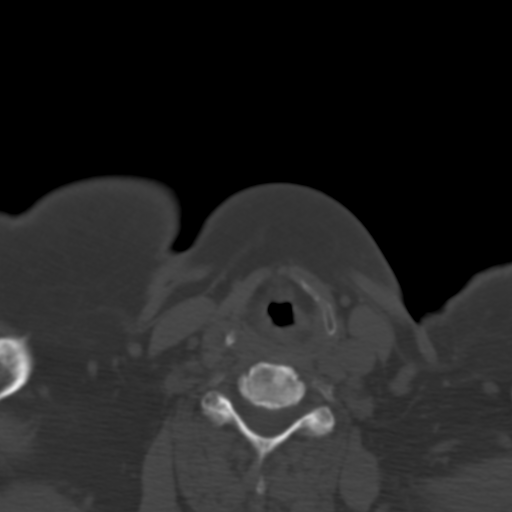
[im 18/100  bone]
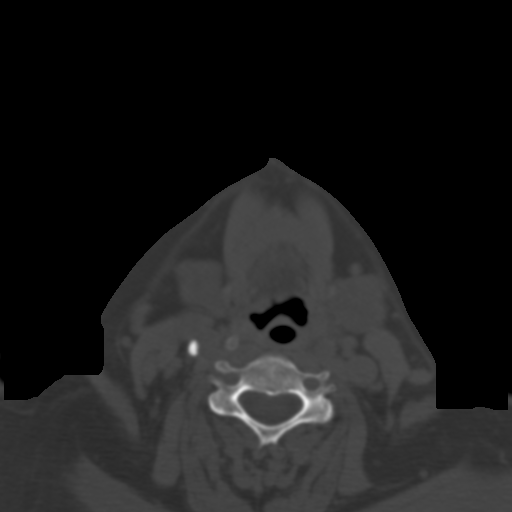
[im 28/100  bone]
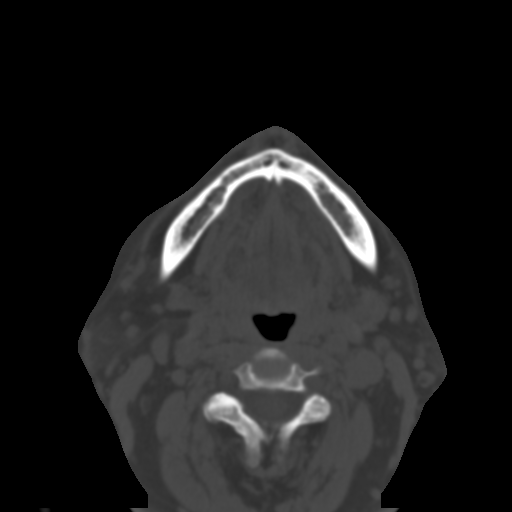
[im 35/100  bone]
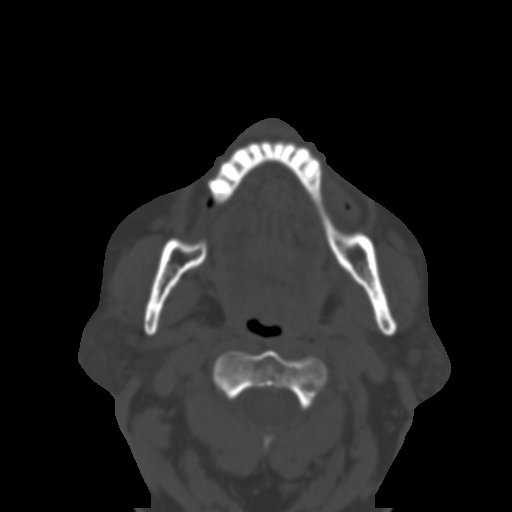
[im 45/100  brain]
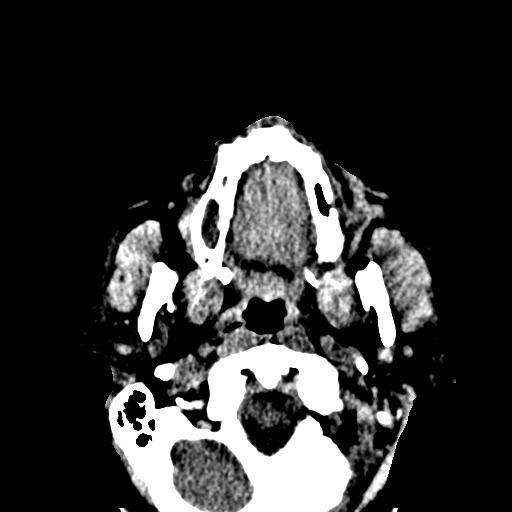
[im 45/100  bone]
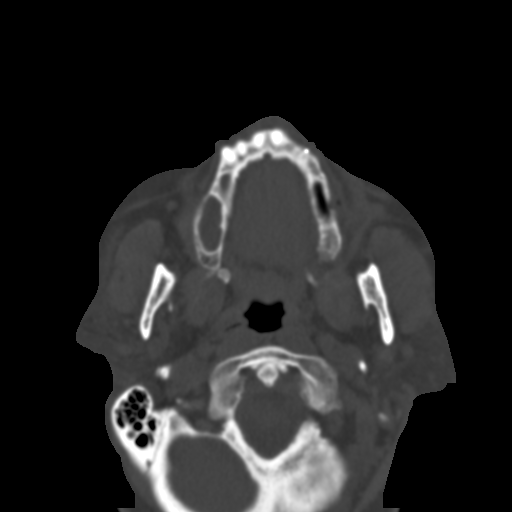
[im 55/100  bone]
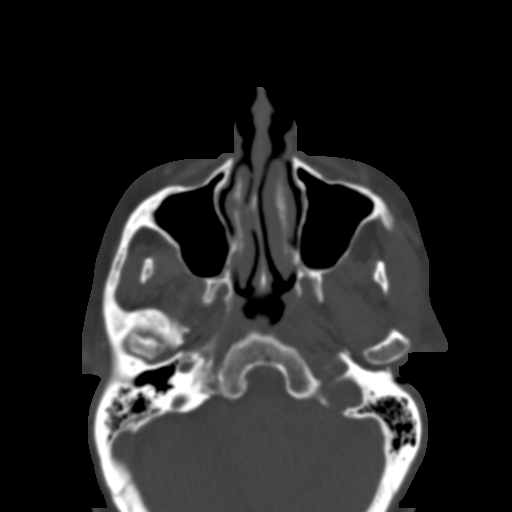
[im 65/100  bone]
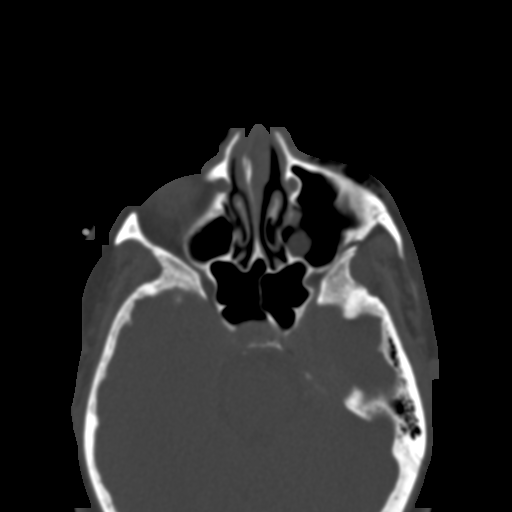
[im 76/100  bone]
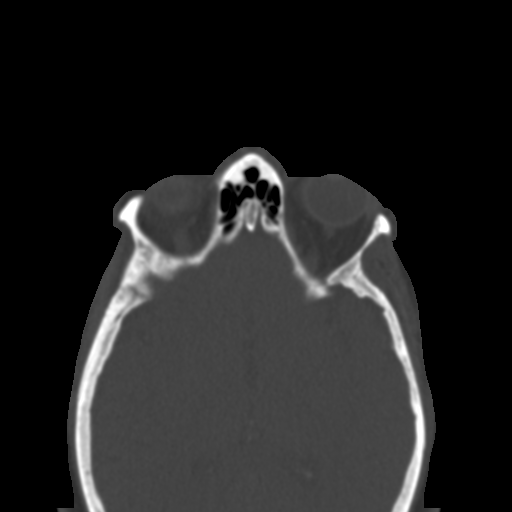
[im 82/100  brain]
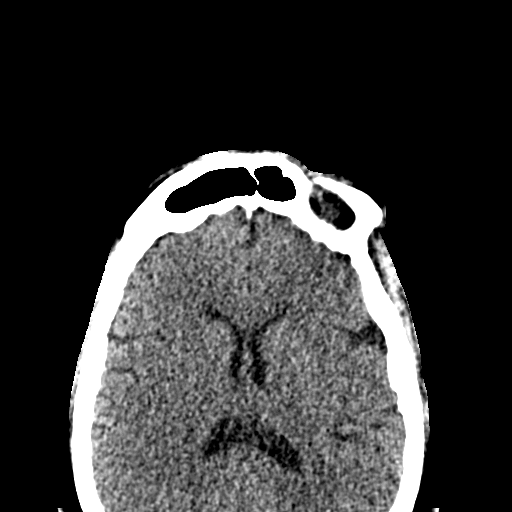
[im 82/100  bone]
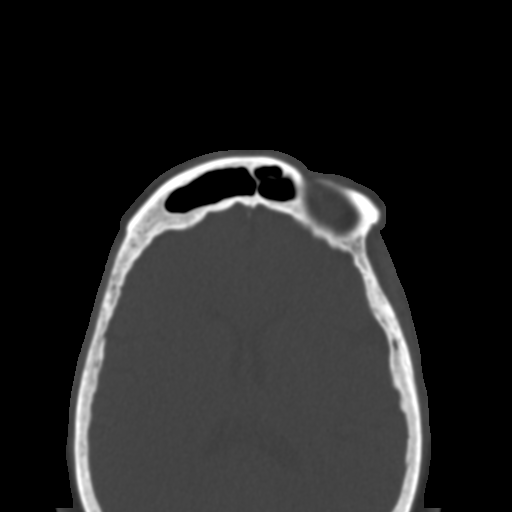
[im 93/100  bone]
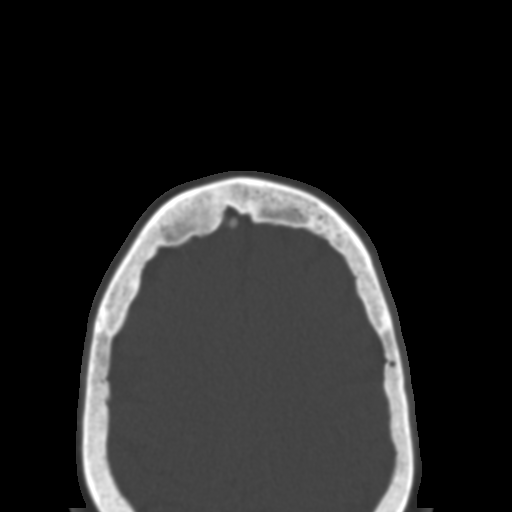

[Series 7: facialbone 2.0 cor st · coronal · 0.36mm/px · 3 of 88 slices shown]
[im 30/88  bone]
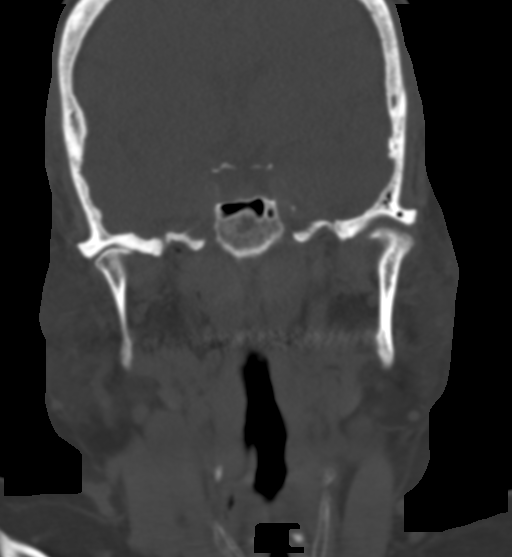
[im 39/88  bone]
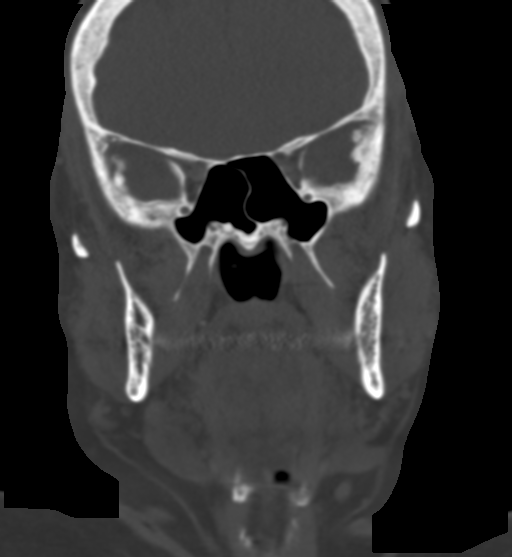
[im 49/88  bone]
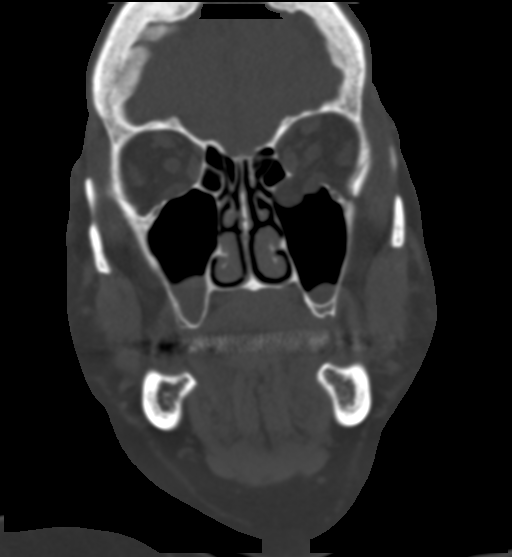

[Series 8: facialbone 2.0 sag st · sagittal · 0.38mm/px · 3 of 90 slices shown]
[im 30/90  bone]
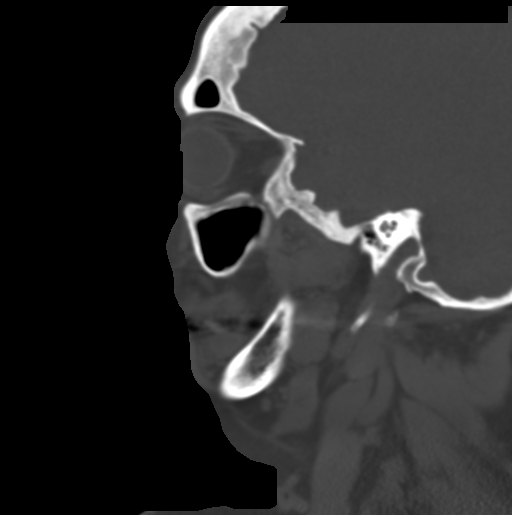
[im 45/90  bone]
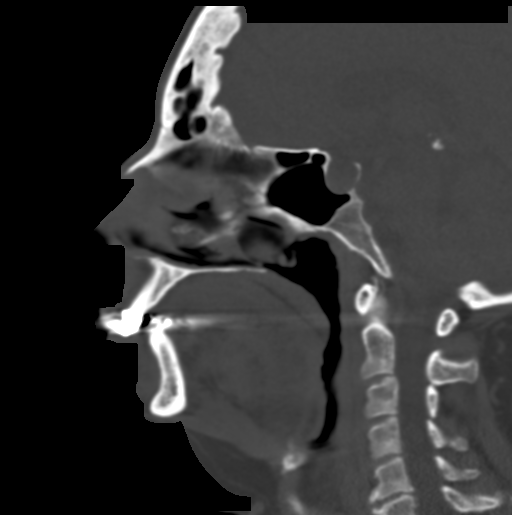
[im 60/90  bone]
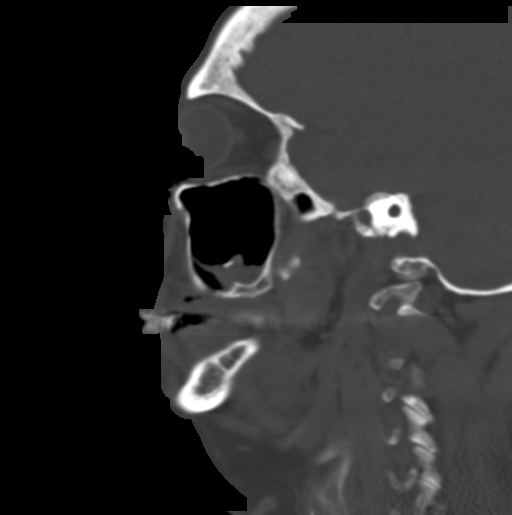

[16 of 47 positions shown; findings below may reference images not displayed]

FINDINGS: Osseous: Blowout fracture left orbital floor posteriorly. Inferior
rectus muscle protrudes into the defect as well as orbital fat. No
other fracture in the face.

Orbits: Gas in the soft tissues of the lower eyelid on the left due
to orbital floor fracture. Negative for orbital mass or hematoma

Sinuses: Mild mucosal edema in the maxillary sinus bilaterally.
Small air-fluid level left maxillary sinus consistent with blood.

Soft tissues: Soft tissue swelling over the left max left maxilla
with gas in the soft tissues.

Limited intracranial: Negative
IMPRESSION: Orbital floor blowout fracture on the left with herniation of fat
and inferior rectus through the defect. Considerable gas in the soft
tissues of the left lower eyelid and maxilla. Small air-fluid level
left maxillary sinus. No other fracture.

## 2019-01-17 ENCOUNTER — Other Ambulatory Visit: Payer: Self-pay | Admitting: Oncology

## 2019-01-17 DIAGNOSIS — Z9889 Other specified postprocedural states: Secondary | ICD-10-CM

## 2019-01-29 ENCOUNTER — Encounter (INDEPENDENT_AMBULATORY_CARE_PROVIDER_SITE_OTHER): Payer: Self-pay

## 2019-02-23 ENCOUNTER — Other Ambulatory Visit: Payer: Self-pay

## 2019-02-23 ENCOUNTER — Ambulatory Visit
Admission: RE | Admit: 2019-02-23 | Discharge: 2019-02-23 | Disposition: A | Discharge status: Home / Self Care | Payer: Medicare Other | Source: Ambulatory Visit | Attending: Oncology | Admitting: Oncology

## 2019-02-23 ENCOUNTER — Other Ambulatory Visit: Payer: Self-pay | Admitting: Oncology

## 2019-02-23 DIAGNOSIS — Z9889 Other specified postprocedural states: Secondary | ICD-10-CM

## 2019-06-14 NOTE — Addendum Note (Signed)
Encounter addended by: Eduard Clos R on: 06/14/2019 10:32 AM  Actions taken: Imaging Exam ended

## 2019-07-12 ENCOUNTER — Ambulatory Visit
Admission: RE | Admit: 2019-07-12 | Discharge: 2019-07-12 | Disposition: A | Discharge status: Home / Self Care | Payer: Medicare Other | Source: Ambulatory Visit | Attending: Orthopedic Surgery | Admitting: Orthopedic Surgery

## 2019-07-12 ENCOUNTER — Other Ambulatory Visit: Payer: Self-pay

## 2019-07-12 ENCOUNTER — Other Ambulatory Visit: Payer: Medicare Other

## 2019-07-12 ENCOUNTER — Other Ambulatory Visit: Payer: Self-pay | Admitting: Orthopedic Surgery

## 2019-07-12 DIAGNOSIS — M25551 Pain in right hip: Secondary | ICD-10-CM

## 2020-04-03 ENCOUNTER — Other Ambulatory Visit: Payer: Self-pay | Admitting: Family Medicine

## 2020-04-03 ENCOUNTER — Other Ambulatory Visit: Payer: Self-pay | Admitting: Oncology

## 2020-04-03 DIAGNOSIS — Z1231 Encounter for screening mammogram for malignant neoplasm of breast: Secondary | ICD-10-CM

## 2020-05-13 ENCOUNTER — Ambulatory Visit: Payer: Medicare Other

## 2020-07-03 ENCOUNTER — Other Ambulatory Visit: Payer: Self-pay

## 2020-07-03 ENCOUNTER — Ambulatory Visit
Admission: RE | Admit: 2020-07-03 | Discharge: 2020-07-03 | Disposition: A | Discharge status: Home / Self Care | Payer: Medicare Other | Source: Ambulatory Visit | Attending: Oncology | Admitting: Oncology

## 2020-07-03 DIAGNOSIS — Z1231 Encounter for screening mammogram for malignant neoplasm of breast: Secondary | ICD-10-CM

## 2020-07-04 DIAGNOSIS — G4733 Obstructive sleep apnea (adult) (pediatric): Secondary | ICD-10-CM | POA: Diagnosis not present

## 2020-07-04 DIAGNOSIS — E1165 Type 2 diabetes mellitus with hyperglycemia: Secondary | ICD-10-CM | POA: Diagnosis not present

## 2020-07-04 DIAGNOSIS — G4700 Insomnia, unspecified: Secondary | ICD-10-CM | POA: Diagnosis not present

## 2020-07-04 DIAGNOSIS — E782 Mixed hyperlipidemia: Secondary | ICD-10-CM | POA: Diagnosis not present

## 2020-07-04 DIAGNOSIS — E890 Postprocedural hypothyroidism: Secondary | ICD-10-CM | POA: Diagnosis not present

## 2020-07-04 DIAGNOSIS — I10 Essential (primary) hypertension: Secondary | ICD-10-CM | POA: Diagnosis not present

## 2020-07-04 DIAGNOSIS — Z7984 Long term (current) use of oral hypoglycemic drugs: Secondary | ICD-10-CM | POA: Diagnosis not present

## 2020-07-04 DIAGNOSIS — M1990 Unspecified osteoarthritis, unspecified site: Secondary | ICD-10-CM | POA: Diagnosis not present

## 2020-07-04 DIAGNOSIS — E559 Vitamin D deficiency, unspecified: Secondary | ICD-10-CM | POA: Diagnosis not present

## 2020-07-04 DIAGNOSIS — E1159 Type 2 diabetes mellitus with other circulatory complications: Secondary | ICD-10-CM | POA: Diagnosis not present

## 2020-07-07 DIAGNOSIS — Z7984 Long term (current) use of oral hypoglycemic drugs: Secondary | ICD-10-CM | POA: Diagnosis not present

## 2020-07-07 DIAGNOSIS — M1990 Unspecified osteoarthritis, unspecified site: Secondary | ICD-10-CM | POA: Diagnosis not present

## 2020-07-07 DIAGNOSIS — G4733 Obstructive sleep apnea (adult) (pediatric): Secondary | ICD-10-CM | POA: Diagnosis not present

## 2020-07-07 DIAGNOSIS — E1159 Type 2 diabetes mellitus with other circulatory complications: Secondary | ICD-10-CM | POA: Diagnosis not present

## 2020-07-07 DIAGNOSIS — R350 Frequency of micturition: Secondary | ICD-10-CM | POA: Diagnosis not present

## 2020-07-07 DIAGNOSIS — I10 Essential (primary) hypertension: Secondary | ICD-10-CM | POA: Diagnosis not present

## 2020-07-07 DIAGNOSIS — G4700 Insomnia, unspecified: Secondary | ICD-10-CM | POA: Diagnosis not present

## 2020-07-07 DIAGNOSIS — N1831 Chronic kidney disease, stage 3a: Secondary | ICD-10-CM | POA: Diagnosis not present

## 2020-07-07 DIAGNOSIS — E890 Postprocedural hypothyroidism: Secondary | ICD-10-CM | POA: Diagnosis not present

## 2020-07-07 DIAGNOSIS — E782 Mixed hyperlipidemia: Secondary | ICD-10-CM | POA: Diagnosis not present

## 2020-07-07 DIAGNOSIS — Z79899 Other long term (current) drug therapy: Secondary | ICD-10-CM | POA: Diagnosis not present

## 2020-07-14 DIAGNOSIS — R350 Frequency of micturition: Secondary | ICD-10-CM | POA: Diagnosis not present

## 2020-12-05 DIAGNOSIS — E1159 Type 2 diabetes mellitus with other circulatory complications: Secondary | ICD-10-CM | POA: Diagnosis not present

## 2020-12-05 DIAGNOSIS — E890 Postprocedural hypothyroidism: Secondary | ICD-10-CM | POA: Diagnosis not present

## 2020-12-17 DIAGNOSIS — Z20822 Contact with and (suspected) exposure to covid-19: Secondary | ICD-10-CM | POA: Diagnosis not present

## 2021-01-12 DIAGNOSIS — Z0000 Encounter for general adult medical examination without abnormal findings: Secondary | ICD-10-CM | POA: Diagnosis not present

## 2021-01-13 DIAGNOSIS — E890 Postprocedural hypothyroidism: Secondary | ICD-10-CM | POA: Diagnosis not present

## 2021-01-13 DIAGNOSIS — E1165 Type 2 diabetes mellitus with hyperglycemia: Secondary | ICD-10-CM | POA: Diagnosis not present

## 2021-01-13 DIAGNOSIS — E1159 Type 2 diabetes mellitus with other circulatory complications: Secondary | ICD-10-CM | POA: Diagnosis not present

## 2021-01-19 DIAGNOSIS — Z7984 Long term (current) use of oral hypoglycemic drugs: Secondary | ICD-10-CM | POA: Diagnosis not present

## 2021-01-19 DIAGNOSIS — G4700 Insomnia, unspecified: Secondary | ICD-10-CM | POA: Diagnosis not present

## 2021-01-19 DIAGNOSIS — E559 Vitamin D deficiency, unspecified: Secondary | ICD-10-CM | POA: Diagnosis not present

## 2021-01-19 DIAGNOSIS — Z23 Encounter for immunization: Secondary | ICD-10-CM | POA: Diagnosis not present

## 2021-01-19 DIAGNOSIS — G4733 Obstructive sleep apnea (adult) (pediatric): Secondary | ICD-10-CM | POA: Diagnosis not present

## 2021-01-19 DIAGNOSIS — M85851 Other specified disorders of bone density and structure, right thigh: Secondary | ICD-10-CM | POA: Diagnosis not present

## 2021-01-19 DIAGNOSIS — E782 Mixed hyperlipidemia: Secondary | ICD-10-CM | POA: Diagnosis not present

## 2021-01-19 DIAGNOSIS — E890 Postprocedural hypothyroidism: Secondary | ICD-10-CM | POA: Diagnosis not present

## 2021-01-19 DIAGNOSIS — E1159 Type 2 diabetes mellitus with other circulatory complications: Secondary | ICD-10-CM | POA: Diagnosis not present

## 2021-01-19 DIAGNOSIS — I10 Essential (primary) hypertension: Secondary | ICD-10-CM | POA: Diagnosis not present

## 2021-01-19 DIAGNOSIS — Z853 Personal history of malignant neoplasm of breast: Secondary | ICD-10-CM | POA: Diagnosis not present

## 2021-01-30 DIAGNOSIS — Z1211 Encounter for screening for malignant neoplasm of colon: Secondary | ICD-10-CM | POA: Diagnosis not present

## 2021-04-22 DIAGNOSIS — M7751 Other enthesopathy of right foot: Secondary | ICD-10-CM | POA: Diagnosis not present

## 2021-05-28 ENCOUNTER — Other Ambulatory Visit: Payer: Self-pay | Admitting: Family Medicine

## 2021-05-28 DIAGNOSIS — Z1231 Encounter for screening mammogram for malignant neoplasm of breast: Secondary | ICD-10-CM

## 2021-06-12 ENCOUNTER — Other Ambulatory Visit: Payer: Self-pay | Admitting: Family Medicine

## 2021-06-12 DIAGNOSIS — M85851 Other specified disorders of bone density and structure, right thigh: Secondary | ICD-10-CM

## 2021-07-08 ENCOUNTER — Ambulatory Visit
Admission: RE | Admit: 2021-07-08 | Discharge: 2021-07-08 | Disposition: A | Discharge status: Home / Self Care | Payer: Medicare Other | Source: Ambulatory Visit | Attending: Family Medicine | Admitting: Family Medicine

## 2021-07-08 DIAGNOSIS — Z1231 Encounter for screening mammogram for malignant neoplasm of breast: Secondary | ICD-10-CM

## 2021-07-13 ENCOUNTER — Ambulatory Visit
Admission: RE | Admit: 2021-07-13 | Discharge: 2021-07-13 | Disposition: A | Discharge status: Home / Self Care | Payer: Medicare Other | Source: Ambulatory Visit | Attending: Family Medicine | Admitting: Family Medicine

## 2021-07-13 DIAGNOSIS — M85851 Other specified disorders of bone density and structure, right thigh: Secondary | ICD-10-CM

## 2021-07-13 DIAGNOSIS — Z780 Asymptomatic menopausal state: Secondary | ICD-10-CM | POA: Diagnosis not present

## 2021-07-13 DIAGNOSIS — M85852 Other specified disorders of bone density and structure, left thigh: Secondary | ICD-10-CM | POA: Diagnosis not present

## 2021-07-15 DIAGNOSIS — E1159 Type 2 diabetes mellitus with other circulatory complications: Secondary | ICD-10-CM | POA: Diagnosis not present

## 2021-07-15 DIAGNOSIS — E782 Mixed hyperlipidemia: Secondary | ICD-10-CM | POA: Diagnosis not present

## 2021-07-15 DIAGNOSIS — E890 Postprocedural hypothyroidism: Secondary | ICD-10-CM | POA: Diagnosis not present

## 2021-07-21 DIAGNOSIS — E782 Mixed hyperlipidemia: Secondary | ICD-10-CM | POA: Diagnosis not present

## 2021-07-21 DIAGNOSIS — M1990 Unspecified osteoarthritis, unspecified site: Secondary | ICD-10-CM | POA: Diagnosis not present

## 2021-07-21 DIAGNOSIS — Z23 Encounter for immunization: Secondary | ICD-10-CM | POA: Diagnosis not present

## 2021-07-21 DIAGNOSIS — E890 Postprocedural hypothyroidism: Secondary | ICD-10-CM | POA: Diagnosis not present

## 2021-07-21 DIAGNOSIS — M85851 Other specified disorders of bone density and structure, right thigh: Secondary | ICD-10-CM | POA: Diagnosis not present

## 2021-07-21 DIAGNOSIS — E039 Hypothyroidism, unspecified: Secondary | ICD-10-CM | POA: Diagnosis not present

## 2021-07-21 DIAGNOSIS — G4733 Obstructive sleep apnea (adult) (pediatric): Secondary | ICD-10-CM | POA: Diagnosis not present

## 2021-07-21 DIAGNOSIS — G4700 Insomnia, unspecified: Secondary | ICD-10-CM | POA: Diagnosis not present

## 2021-07-21 DIAGNOSIS — E1159 Type 2 diabetes mellitus with other circulatory complications: Secondary | ICD-10-CM | POA: Diagnosis not present

## 2021-07-21 DIAGNOSIS — I10 Essential (primary) hypertension: Secondary | ICD-10-CM | POA: Diagnosis not present

## 2021-07-21 DIAGNOSIS — H9193 Unspecified hearing loss, bilateral: Secondary | ICD-10-CM | POA: Diagnosis not present

## 2022-01-13 DIAGNOSIS — Z0000 Encounter for general adult medical examination without abnormal findings: Secondary | ICD-10-CM | POA: Diagnosis not present

## 2022-01-20 DIAGNOSIS — E039 Hypothyroidism, unspecified: Secondary | ICD-10-CM | POA: Diagnosis not present

## 2022-01-20 DIAGNOSIS — E1159 Type 2 diabetes mellitus with other circulatory complications: Secondary | ICD-10-CM | POA: Diagnosis not present

## 2022-01-27 DIAGNOSIS — G4700 Insomnia, unspecified: Secondary | ICD-10-CM | POA: Diagnosis not present

## 2022-01-27 DIAGNOSIS — E1159 Type 2 diabetes mellitus with other circulatory complications: Secondary | ICD-10-CM | POA: Diagnosis not present

## 2022-01-27 DIAGNOSIS — Z1211 Encounter for screening for malignant neoplasm of colon: Secondary | ICD-10-CM | POA: Diagnosis not present

## 2022-01-27 DIAGNOSIS — M1990 Unspecified osteoarthritis, unspecified site: Secondary | ICD-10-CM | POA: Diagnosis not present

## 2022-01-27 DIAGNOSIS — E782 Mixed hyperlipidemia: Secondary | ICD-10-CM | POA: Diagnosis not present

## 2022-01-27 DIAGNOSIS — E039 Hypothyroidism, unspecified: Secondary | ICD-10-CM | POA: Diagnosis not present

## 2022-01-27 DIAGNOSIS — I10 Essential (primary) hypertension: Secondary | ICD-10-CM | POA: Diagnosis not present

## 2022-01-27 DIAGNOSIS — M85851 Other specified disorders of bone density and structure, right thigh: Secondary | ICD-10-CM | POA: Diagnosis not present

## 2022-01-27 DIAGNOSIS — G4733 Obstructive sleep apnea (adult) (pediatric): Secondary | ICD-10-CM | POA: Diagnosis not present

## 2022-01-27 DIAGNOSIS — Z79899 Other long term (current) drug therapy: Secondary | ICD-10-CM | POA: Diagnosis not present

## 2022-03-15 DIAGNOSIS — Z1211 Encounter for screening for malignant neoplasm of colon: Secondary | ICD-10-CM | POA: Diagnosis not present

## 2022-06-07 ENCOUNTER — Other Ambulatory Visit: Payer: Self-pay | Admitting: Family Medicine

## 2022-06-07 DIAGNOSIS — Z1231 Encounter for screening mammogram for malignant neoplasm of breast: Secondary | ICD-10-CM

## 2022-08-03 ENCOUNTER — Ambulatory Visit
Admission: RE | Admit: 2022-08-03 | Discharge: 2022-08-03 | Disposition: A | Discharge status: Home / Self Care | Payer: Medicare Other | Source: Ambulatory Visit | Attending: Family Medicine | Admitting: Family Medicine

## 2022-08-03 DIAGNOSIS — Z1231 Encounter for screening mammogram for malignant neoplasm of breast: Secondary | ICD-10-CM | POA: Diagnosis not present

## 2022-08-26 DIAGNOSIS — E1159 Type 2 diabetes mellitus with other circulatory complications: Secondary | ICD-10-CM | POA: Diagnosis not present

## 2022-08-26 DIAGNOSIS — E782 Mixed hyperlipidemia: Secondary | ICD-10-CM | POA: Diagnosis not present

## 2022-08-26 DIAGNOSIS — E039 Hypothyroidism, unspecified: Secondary | ICD-10-CM | POA: Diagnosis not present

## 2022-08-26 DIAGNOSIS — E559 Vitamin D deficiency, unspecified: Secondary | ICD-10-CM | POA: Diagnosis not present

## 2022-09-08 DIAGNOSIS — M1990 Unspecified osteoarthritis, unspecified site: Secondary | ICD-10-CM | POA: Diagnosis not present

## 2022-09-08 DIAGNOSIS — I10 Essential (primary) hypertension: Secondary | ICD-10-CM | POA: Diagnosis not present

## 2022-09-08 DIAGNOSIS — Z9989 Dependence on other enabling machines and devices: Secondary | ICD-10-CM | POA: Diagnosis not present

## 2022-09-08 DIAGNOSIS — G4733 Obstructive sleep apnea (adult) (pediatric): Secondary | ICD-10-CM | POA: Diagnosis not present

## 2022-09-08 DIAGNOSIS — E782 Mixed hyperlipidemia: Secondary | ICD-10-CM | POA: Diagnosis not present

## 2022-09-08 DIAGNOSIS — E1122 Type 2 diabetes mellitus with diabetic chronic kidney disease: Secondary | ICD-10-CM | POA: Diagnosis not present

## 2022-09-08 DIAGNOSIS — E1165 Type 2 diabetes mellitus with hyperglycemia: Secondary | ICD-10-CM | POA: Diagnosis not present

## 2022-09-08 DIAGNOSIS — E039 Hypothyroidism, unspecified: Secondary | ICD-10-CM | POA: Diagnosis not present

## 2022-09-08 DIAGNOSIS — N1831 Chronic kidney disease, stage 3a: Secondary | ICD-10-CM | POA: Diagnosis not present

## 2022-10-28 DIAGNOSIS — E1122 Type 2 diabetes mellitus with diabetic chronic kidney disease: Secondary | ICD-10-CM | POA: Diagnosis not present

## 2023-01-03 DIAGNOSIS — N1831 Chronic kidney disease, stage 3a: Secondary | ICD-10-CM | POA: Diagnosis not present

## 2023-01-03 DIAGNOSIS — E039 Hypothyroidism, unspecified: Secondary | ICD-10-CM | POA: Diagnosis not present

## 2023-01-03 DIAGNOSIS — E1159 Type 2 diabetes mellitus with other circulatory complications: Secondary | ICD-10-CM | POA: Diagnosis not present

## 2023-01-03 DIAGNOSIS — Z79899 Other long term (current) drug therapy: Secondary | ICD-10-CM | POA: Diagnosis not present

## 2023-01-18 DIAGNOSIS — G4733 Obstructive sleep apnea (adult) (pediatric): Secondary | ICD-10-CM | POA: Diagnosis not present

## 2023-01-18 DIAGNOSIS — E782 Mixed hyperlipidemia: Secondary | ICD-10-CM | POA: Diagnosis not present

## 2023-01-18 DIAGNOSIS — E039 Hypothyroidism, unspecified: Secondary | ICD-10-CM | POA: Diagnosis not present

## 2023-01-18 DIAGNOSIS — I10 Essential (primary) hypertension: Secondary | ICD-10-CM | POA: Diagnosis not present

## 2023-01-18 DIAGNOSIS — N1831 Chronic kidney disease, stage 3a: Secondary | ICD-10-CM | POA: Diagnosis not present

## 2023-01-18 DIAGNOSIS — E1122 Type 2 diabetes mellitus with diabetic chronic kidney disease: Secondary | ICD-10-CM | POA: Diagnosis not present

## 2023-01-18 DIAGNOSIS — G4700 Insomnia, unspecified: Secondary | ICD-10-CM | POA: Diagnosis not present

## 2023-01-18 DIAGNOSIS — E559 Vitamin D deficiency, unspecified: Secondary | ICD-10-CM | POA: Diagnosis not present

## 2023-01-18 DIAGNOSIS — E1165 Type 2 diabetes mellitus with hyperglycemia: Secondary | ICD-10-CM | POA: Diagnosis not present

## 2023-04-13 DIAGNOSIS — H2512 Age-related nuclear cataract, left eye: Secondary | ICD-10-CM | POA: Diagnosis not present

## 2023-04-13 DIAGNOSIS — E119 Type 2 diabetes mellitus without complications: Secondary | ICD-10-CM | POA: Diagnosis not present

## 2023-04-13 DIAGNOSIS — H43811 Vitreous degeneration, right eye: Secondary | ICD-10-CM | POA: Diagnosis not present

## 2023-04-13 DIAGNOSIS — H2511 Age-related nuclear cataract, right eye: Secondary | ICD-10-CM | POA: Diagnosis not present

## 2023-04-20 DIAGNOSIS — E782 Mixed hyperlipidemia: Secondary | ICD-10-CM | POA: Diagnosis not present

## 2023-04-20 DIAGNOSIS — I10 Essential (primary) hypertension: Secondary | ICD-10-CM | POA: Diagnosis not present

## 2023-04-20 DIAGNOSIS — E039 Hypothyroidism, unspecified: Secondary | ICD-10-CM | POA: Diagnosis not present

## 2023-04-20 DIAGNOSIS — H9193 Unspecified hearing loss, bilateral: Secondary | ICD-10-CM | POA: Diagnosis not present

## 2023-04-20 DIAGNOSIS — E1165 Type 2 diabetes mellitus with hyperglycemia: Secondary | ICD-10-CM | POA: Diagnosis not present

## 2023-04-20 DIAGNOSIS — Z1211 Encounter for screening for malignant neoplasm of colon: Secondary | ICD-10-CM | POA: Diagnosis not present

## 2023-04-20 DIAGNOSIS — E1122 Type 2 diabetes mellitus with diabetic chronic kidney disease: Secondary | ICD-10-CM | POA: Diagnosis not present

## 2023-04-20 DIAGNOSIS — M85851 Other specified disorders of bone density and structure, right thigh: Secondary | ICD-10-CM | POA: Diagnosis not present

## 2023-04-20 DIAGNOSIS — Z0000 Encounter for general adult medical examination without abnormal findings: Secondary | ICD-10-CM | POA: Diagnosis not present

## 2023-04-21 ENCOUNTER — Other Ambulatory Visit: Payer: Self-pay | Admitting: Family Medicine

## 2023-04-21 DIAGNOSIS — M85851 Other specified disorders of bone density and structure, right thigh: Secondary | ICD-10-CM

## 2023-04-21 DIAGNOSIS — Z1231 Encounter for screening mammogram for malignant neoplasm of breast: Secondary | ICD-10-CM

## 2023-08-03 DIAGNOSIS — N1831 Chronic kidney disease, stage 3a: Secondary | ICD-10-CM | POA: Diagnosis not present

## 2023-08-03 DIAGNOSIS — E782 Mixed hyperlipidemia: Secondary | ICD-10-CM | POA: Diagnosis not present

## 2023-08-03 DIAGNOSIS — E039 Hypothyroidism, unspecified: Secondary | ICD-10-CM | POA: Diagnosis not present

## 2023-08-03 DIAGNOSIS — E1165 Type 2 diabetes mellitus with hyperglycemia: Secondary | ICD-10-CM | POA: Diagnosis not present

## 2023-08-03 DIAGNOSIS — Z79899 Other long term (current) drug therapy: Secondary | ICD-10-CM | POA: Diagnosis not present

## 2023-08-09 DIAGNOSIS — G4733 Obstructive sleep apnea (adult) (pediatric): Secondary | ICD-10-CM | POA: Diagnosis not present

## 2023-08-09 DIAGNOSIS — I10 Essential (primary) hypertension: Secondary | ICD-10-CM | POA: Diagnosis not present

## 2023-08-09 DIAGNOSIS — E1165 Type 2 diabetes mellitus with hyperglycemia: Secondary | ICD-10-CM | POA: Diagnosis not present

## 2023-08-09 DIAGNOSIS — Z79899 Other long term (current) drug therapy: Secondary | ICD-10-CM | POA: Diagnosis not present

## 2023-08-09 DIAGNOSIS — M1990 Unspecified osteoarthritis, unspecified site: Secondary | ICD-10-CM | POA: Diagnosis not present

## 2023-08-09 DIAGNOSIS — E559 Vitamin D deficiency, unspecified: Secondary | ICD-10-CM | POA: Diagnosis not present

## 2023-08-09 DIAGNOSIS — R808 Other proteinuria: Secondary | ICD-10-CM | POA: Diagnosis not present

## 2023-08-09 DIAGNOSIS — E782 Mixed hyperlipidemia: Secondary | ICD-10-CM | POA: Diagnosis not present

## 2023-08-09 DIAGNOSIS — G4700 Insomnia, unspecified: Secondary | ICD-10-CM | POA: Diagnosis not present

## 2023-08-09 DIAGNOSIS — E039 Hypothyroidism, unspecified: Secondary | ICD-10-CM | POA: Diagnosis not present

## 2023-08-25 DIAGNOSIS — Z23 Encounter for immunization: Secondary | ICD-10-CM | POA: Diagnosis not present

## 2023-10-12 ENCOUNTER — Ambulatory Visit
Admission: RE | Admit: 2023-10-12 | Discharge: 2023-10-12 | Disposition: A | Discharge status: Home / Self Care | Payer: Medicare Other | Source: Ambulatory Visit | Attending: Family Medicine | Admitting: Family Medicine

## 2023-10-12 ENCOUNTER — Other Ambulatory Visit: Payer: Medicare Other

## 2023-10-12 DIAGNOSIS — Z1231 Encounter for screening mammogram for malignant neoplasm of breast: Secondary | ICD-10-CM | POA: Diagnosis not present

## 2023-10-26 DIAGNOSIS — H538 Other visual disturbances: Secondary | ICD-10-CM | POA: Diagnosis not present

## 2023-10-26 DIAGNOSIS — H2513 Age-related nuclear cataract, bilateral: Secondary | ICD-10-CM | POA: Diagnosis not present

## 2023-10-31 DIAGNOSIS — H2512 Age-related nuclear cataract, left eye: Secondary | ICD-10-CM | POA: Diagnosis not present

## 2023-11-07 ENCOUNTER — Ambulatory Visit
Admission: RE | Admit: 2023-11-07 | Discharge: 2023-11-07 | Disposition: A | Discharge status: Home / Self Care | Source: Ambulatory Visit | Attending: Family Medicine | Admitting: Family Medicine

## 2023-11-07 DIAGNOSIS — M85852 Other specified disorders of bone density and structure, left thigh: Secondary | ICD-10-CM | POA: Diagnosis not present

## 2023-11-07 DIAGNOSIS — Z780 Asymptomatic menopausal state: Secondary | ICD-10-CM | POA: Diagnosis not present

## 2023-11-07 DIAGNOSIS — M85851 Other specified disorders of bone density and structure, right thigh: Secondary | ICD-10-CM | POA: Insufficient documentation

## 2023-11-08 ENCOUNTER — Encounter: Payer: Self-pay | Admitting: Ophthalmology

## 2023-11-10 ENCOUNTER — Encounter: Payer: Self-pay | Admitting: Ophthalmology

## 2023-11-10 NOTE — Anesthesia Preprocedure Evaluation (Addendum)
 Anesthesia Evaluation  Patient identified by MRN, date of birth, ID band Patient awake    Reviewed: Allergy & Precautions, H&P , NPO status , Patient's Chart, lab work & pertinent test results  History of Anesthesia Complications (+) PONV and history of anesthetic complications  Airway Mallampati: III  TM Distance: <3 FB Neck ROM: Full   Comment: Likely very anterior airway if intubation were needed Dental no notable dental hx. (+) Partial Upper, Caps Multiple caps and crowns, many front, not sure which teeth:   Pulmonary neg pulmonary ROS, sleep apnea , pneumonia   Pulmonary exam normal breath sounds clear to auscultation       Cardiovascular hypertension, negative cardio ROS Normal cardiovascular exam Rhythm:Regular Rate:Normal  07-07-04 echo Left ventricular size was normal.   -  Overall left ventricular systolic function was normal.   -  Left ventricular ejection fraction was estimated , range being 55         % to 65 %..   -  There were no left ventricular regional wall motion         abnormalities.   -  Left ventricular wall thickness was mildly increased.    Doppler interpretation(s):   -  There was an increased relative contribution of atrial         contraction to left ventricular filling.   -  Features were consistent with moderate diastolic dysfunction.   -  Doppler parameters were consistent with abnormal left ventricular         relaxation.   12-15-17  Nuclear stress EF: 66%.  There was no ST segment deviation noted during stress.  No T wave inversion was noted during stress.  Defect 1: There is a small defect of mild severity present in the apex location.  Findings consistent with ischemia.  This is a low risk study.   Low risk stress nuclear study with a very small area of apical ischemia. Normal left ventricular regional and global systolic function.      Neuro/Psych  PSYCHIATRIC DISORDERS  Anxiety      Neuromuscular disease negative neurological ROS  negative psych ROS   GI/Hepatic negative GI ROS, Neg liver ROS,,,  Endo/Other  negative endocrine ROSdiabetes    Renal/GU Renal diseasenegative Renal ROS  negative genitourinary   Musculoskeletal negative musculoskeletal ROS (+) Arthritis ,    Abdominal   Peds negative pediatric ROS (+)  Hematology negative hematology ROS (+)   Anesthesia Other Findings Right knee DJD Diabetes (HCC) History of pulmonary embolism Hypertension High cholesterol Pulmonary embolism (HCC) Pneumonia Bell's palsy Left knee DJD Subcutaneous hematoma left leg just outside knee joint Breast cancer (HCC) Thyroid  disease Wears glasses Wears partial dentures Sleep apnea PONV (postoperative nausea and vomiting) Edema leg History of radiation therapy Personal history of radiation therapy Generalized anxiety disorder Morbid obesity with BMI of 40.0-44.9, adult (HCC) Chronic kidney disease, stage 3a (HCC) Type 2 diabetes mellitus with other circulatory complications (HCC)     Reproductive/Obstetrics negative OB ROS                              Anesthesia Physical Anesthesia Plan  ASA: 3  Anesthesia Plan:    Post-op Pain Management:    Induction:   PONV Risk Score and Plan:   Airway Management Planned:   Additional Equipment:   Intra-op Plan:   Post-operative Plan:   Informed Consent:   Plan Discussed with:   Anesthesia Plan  Comments:          Anesthesia Quick Evaluation

## 2023-11-22 NOTE — Discharge Instructions (Signed)

## 2023-11-24 ENCOUNTER — Ambulatory Visit
Admission: RE | Admit: 2023-11-24 | Discharge: 2023-11-24 | Disposition: A | Discharge status: Home / Self Care | Attending: Ophthalmology | Admitting: Ophthalmology

## 2023-11-24 ENCOUNTER — Ambulatory Visit: Payer: Self-pay | Admitting: Anesthesiology

## 2023-11-24 ENCOUNTER — Other Ambulatory Visit: Payer: Self-pay

## 2023-11-24 ENCOUNTER — Encounter
Admission: RE | Disposition: A | Discharge status: Home / Self Care | Payer: Self-pay | Source: Home / Self Care | Attending: Ophthalmology

## 2023-11-24 ENCOUNTER — Encounter: Payer: Self-pay | Admitting: Ophthalmology

## 2023-11-24 DIAGNOSIS — H2512 Age-related nuclear cataract, left eye: Secondary | ICD-10-CM | POA: Diagnosis not present

## 2023-11-24 DIAGNOSIS — M1990 Unspecified osteoarthritis, unspecified site: Secondary | ICD-10-CM | POA: Insufficient documentation

## 2023-11-24 DIAGNOSIS — Z79890 Hormone replacement therapy (postmenopausal): Secondary | ICD-10-CM | POA: Insufficient documentation

## 2023-11-24 DIAGNOSIS — E7800 Pure hypercholesterolemia, unspecified: Secondary | ICD-10-CM | POA: Diagnosis not present

## 2023-11-24 DIAGNOSIS — E1136 Type 2 diabetes mellitus with diabetic cataract: Secondary | ICD-10-CM | POA: Insufficient documentation

## 2023-11-24 DIAGNOSIS — Z79899 Other long term (current) drug therapy: Secondary | ICD-10-CM | POA: Diagnosis not present

## 2023-11-24 DIAGNOSIS — Z7985 Long-term (current) use of injectable non-insulin antidiabetic drugs: Secondary | ICD-10-CM | POA: Insufficient documentation

## 2023-11-24 DIAGNOSIS — G4730 Sleep apnea, unspecified: Secondary | ICD-10-CM | POA: Diagnosis not present

## 2023-11-24 DIAGNOSIS — I129 Hypertensive chronic kidney disease with stage 1 through stage 4 chronic kidney disease, or unspecified chronic kidney disease: Secondary | ICD-10-CM | POA: Diagnosis not present

## 2023-11-24 DIAGNOSIS — F419 Anxiety disorder, unspecified: Secondary | ICD-10-CM | POA: Diagnosis not present

## 2023-11-24 DIAGNOSIS — E1159 Type 2 diabetes mellitus with other circulatory complications: Secondary | ICD-10-CM | POA: Diagnosis not present

## 2023-11-24 DIAGNOSIS — E1122 Type 2 diabetes mellitus with diabetic chronic kidney disease: Secondary | ICD-10-CM | POA: Insufficient documentation

## 2023-11-24 DIAGNOSIS — Z7984 Long term (current) use of oral hypoglycemic drugs: Secondary | ICD-10-CM | POA: Diagnosis not present

## 2023-11-24 DIAGNOSIS — Z6841 Body Mass Index (BMI) 40.0 and over, adult: Secondary | ICD-10-CM | POA: Insufficient documentation

## 2023-11-24 DIAGNOSIS — N1831 Chronic kidney disease, stage 3a: Secondary | ICD-10-CM | POA: Insufficient documentation

## 2023-11-24 DIAGNOSIS — Z86711 Personal history of pulmonary embolism: Secondary | ICD-10-CM | POA: Insufficient documentation

## 2023-11-24 DIAGNOSIS — Z833 Family history of diabetes mellitus: Secondary | ICD-10-CM | POA: Diagnosis not present

## 2023-11-24 HISTORY — DX: Type 2 diabetes mellitus with other circulatory complications: E11.59

## 2023-11-24 HISTORY — DX: Generalized anxiety disorder: F41.1

## 2023-11-24 HISTORY — PX: CATARACT EXTRACTION W/PHACO: SHX586

## 2023-11-24 HISTORY — DX: Morbid (severe) obesity due to excess calories: E66.01

## 2023-11-24 HISTORY — DX: Chronic kidney disease, stage 3a: N18.31

## 2023-11-24 LAB — GLUCOSE, CAPILLARY: Glucose-Capillary: 142 mg/dL — ABNORMAL HIGH (ref 70–99)

## 2023-11-24 SURGERY — PHACOEMULSIFICATION, CATARACT, WITH IOL INSERTION
Anesthesia: Topical | Laterality: Left

## 2023-11-24 MED ORDER — MOXIFLOXACIN HCL 0.5 % OP SOLN
OPHTHALMIC | Status: DC | PRN
  Administered 2023-11-24: .2 mL via OPHTHALMIC

## 2023-11-24 MED ORDER — SIGHTPATH DOSE#1 NA HYALUR & NA CHOND-NA HYALUR IO KIT
PACK | INTRAOCULAR | Status: DC | PRN
  Administered 2023-11-24: 1 via OPHTHALMIC

## 2023-11-24 MED ORDER — LACTATED RINGERS IV SOLN: INTRAVENOUS | Status: DC

## 2023-11-24 MED ORDER — ONDANSETRON HCL 4 MG/2ML IJ SOLN
INTRAMUSCULAR | Status: DC | PRN
  Administered 2023-11-24: 4 mg via INTRAVENOUS

## 2023-11-24 MED ORDER — ARMC OPHTHALMIC DILATING DROPS
1.0000 | OPHTHALMIC | Status: DC | PRN
  Administered 2023-11-24 (×3): 1 via OPHTHALMIC

## 2023-11-24 MED ORDER — MIDAZOLAM HCL 2 MG/2ML IJ SOLN
INTRAMUSCULAR | Status: DC | PRN
  Administered 2023-11-24 (×2): 1 mg via INTRAVENOUS

## 2023-11-24 MED ORDER — MIDAZOLAM HCL 2 MG/2ML IJ SOLN
INTRAMUSCULAR | Status: AC
  Filled 2023-11-24: qty 2

## 2023-11-24 MED ORDER — FENTANYL CITRATE (PF) 100 MCG/2ML IJ SOLN
INTRAMUSCULAR | Status: DC | PRN
Start: 2023-11-24 — End: 2023-11-24
  Administered 2023-11-24 (×2): 50 ug via INTRAVENOUS

## 2023-11-24 MED ORDER — BRIMONIDINE TARTRATE-TIMOLOL 0.2-0.5 % OP SOLN
OPHTHALMIC | Status: DC | PRN
  Administered 2023-11-24: 1 [drp] via OPHTHALMIC

## 2023-11-24 MED ORDER — SIGHTPATH DOSE#1 BSS IO SOLN
INTRAOCULAR | Status: DC | PRN
  Administered 2023-11-24: 88 mL via OPHTHALMIC

## 2023-11-24 MED ORDER — LIDOCAINE HCL (PF) 2 % IJ SOLN
INTRAMUSCULAR | Status: DC | PRN
  Administered 2023-11-24: 1 mL via INTRAOCULAR

## 2023-11-24 MED ORDER — FENTANYL CITRATE (PF) 100 MCG/2ML IJ SOLN
INTRAMUSCULAR | Status: AC
  Filled 2023-11-24: qty 2

## 2023-11-24 MED ORDER — SIGHTPATH DOSE#1 BSS IO SOLN
INTRAOCULAR | Status: DC | PRN
Start: 2023-11-24 — End: 2023-11-24
  Administered 2023-11-24: 15 mL via INTRAOCULAR

## 2023-11-24 MED ORDER — TETRACAINE HCL 0.5 % OP SOLN
OPHTHALMIC | Status: AC
  Filled 2023-11-24: qty 4

## 2023-11-24 MED ORDER — ARMC OPHTHALMIC DILATING DROPS
OPHTHALMIC | Status: AC
  Filled 2023-11-24: qty 0.5

## 2023-11-24 MED ORDER — TETRACAINE HCL 0.5 % OP SOLN
1.0000 [drp] | OPHTHALMIC | Status: DC | PRN
  Administered 2023-11-24 (×3): 1 [drp] via OPHTHALMIC

## 2023-11-24 SURGICAL SUPPLY — 10 items
DISSECTOR HYDRO NUCLEUS 50X22 (MISCELLANEOUS) ×1 IMPLANT
DRSG TEGADERM 2-3/8X2-3/4 SM (GAUZE/BANDAGES/DRESSINGS) ×1 IMPLANT
FEE CATARACT SUITE SIGHTPATH (MISCELLANEOUS) ×1 IMPLANT
GLOVE BIOGEL PI IND STRL 8 (GLOVE) ×1 IMPLANT
GLOVE SURG LX STRL 7.5 STRW (GLOVE) ×1 IMPLANT
GLOVE SURG SYN 6.5 PF PI BL (GLOVE) ×1 IMPLANT
LENS IOL TECNIS MONO 22.0 (Intraocular Lens) IMPLANT
NDL FILTER BLUNT 18X1 1/2 (NEEDLE) ×1 IMPLANT
NEEDLE FILTER BLUNT 18X1 1/2 (NEEDLE) ×1 IMPLANT
SYR 3ML LL SCALE MARK (SYRINGE) ×1 IMPLANT

## 2023-11-24 NOTE — Transfer of Care (Signed)
 Immediate Anesthesia Transfer of Care Note  Patient: ELONDA GIULIANO  Procedure(s) Performed: PHACOEMULSIFICATION, CATARACT, WITH IOL INSERTION (Left)  Patient Location: PACU  Anesthesia Type: No value filed.  Level of Consciousness: awake, alert  and patient cooperative  Airway and Oxygen Therapy: Patient Spontanous Breathing and Patient connected to supplemental oxygen  Post-op Assessment: Post-op Vital signs reviewed, Patient's Cardiovascular Status Stable, Respiratory Function Stable, Patent Airway and No signs of Nausea or vomiting  Post-op Vital Signs: Reviewed and stable  Complications: No notable events documented.

## 2023-11-24 NOTE — H&P (Signed)
 [facility]   Primary Care Physician:  Aisha Harvey, MD Ophthalmologist: Dr. Feliciano Ober  Pre-Procedure History & Physical: HPI:  [name] is a 78 y.o. female here for cataract surgery.   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   Morbid obesity with BMI of 40.0-44.9, adult (HCC)    Personal history of radiation therapy    Pneumonia    PONV (postoperative nausea and vomiting)    still got sick 1/14-may need scop patch   Pulmonary embolism (HCC)    Right knee DJD    Sleep apnea    uses a cpap   Subcutaneous hematoma left leg just outside knee joint 04/10/2012   Culture was done of this fluid collection is pending  Gram stain had white cells but no organism   Thyroid  disease    Type 2 diabetes mellitus with other circulatory complications (HCC)    Wears glasses    Wears partial dentures    upper partial    Past Surgical History:  Procedure Laterality Date   ABDOMINAL HYSTERECTOMY  1981   with unilateral slapingo-oophorectomy   BREAST LUMPECTOMY Left 2014   BREAST LUMPECTOMY WITH NEEDLE LOCALIZATION Left 12/28/2012   Procedure: BREAST LUMPECTOMY WITH NEEDLE LOCALIZATION;  Surgeon: Alm VEAR Angle, MD;  Location:  SUR[facility]ERY [facility];  Service: General;  Laterality: Left;   KNEE ARTHROPLASTY  2007   left   THYROIDECTOMY  2010   TOTAL KNEE ARTHROPLASTY  04/10/2012   Procedure: TOTAL KNEE ARTHROPLASTY;  Surgeon: Lamar DELENA Millman, MD;  Location: [facility] OR;  Service: Orthopedics;  Laterality: Right;    Prior to Admission medications   Medication Sig Start Date  End Date Taking? Authorizing Provider  ACCU-CHEK AVIVA PLUS test strip  12/08/12  Yes [provider]  ascorbic acid (VITAMIN C) 500 MG tablet Take 500 mg by mouth daily.   Yes [provider]  diclofenac sodium (VOLTAREN) 1 % GEL Apply topically as needed.   Yes Aisha Harvey, MD  Dulaglutide (TRULICITY) 1.5 MG/0.5ML SOAJ Inject into the skin.   Yes [provider]  glimepiride (AMARYL) 4 MG tablet Take 4 mg by mouth daily with breakfast.   Yes [provider]  levothyroxine  (SYNTHROID ) 125 MCG tablet Take 125 mcg by mouth daily before breakfast.   Yes [provider]  lisinopril  (PRINIVIL ,ZESTRIL ) 10 MG tablet Take 10 mg by mouth daily. 02/01/12  Yes [provider]  metformin  (FORTAMET ) 500 MG (OSM) 24 hr tablet Take 500 mg by mouth daily with breakfast.   Yes [provider]  metFORMIN  (GLUMETZA ) 500 MG (MOD) 24 hr tablet Take 500 mg by mouth daily with breakfast.   Yes [provider]  simvastatin  (ZOCOR ) 40 MG tablet Take 40 mg by mouth daily. 12/01/11  Yes [provider]  zolpidem  (AMBIEN ) 10 MG tablet Take 1 tablet (10 mg total) by mouth at bedtime as needed for sleep. For sleep 04/12/12  Yes Shepperson, Kirstin, PA-C  Allergies as of 10/28/2023   (No Known Allergies)    Family History  Problem Relation Age of Onset   Hypertension Mother    Heart attack Mother    Heart disease Mother        before age 19   Other Mother        varicose veins   Cancer Father        kidney   Diabetes Father    Heart disease Father    Hyperlipidemia Father    Hypertension Father    Diabetes Son    Hyperlipidemia Son    Hypertension Son    Other Son        varicose veins   Breast cancer Paternal Aunt     Social History   Socioeconomic History   Marital status: Married    Spouse name: Not on file   Number of children: 4   Years of education: Not on file   Highest education level: Not on file  Occupational  History   Occupation: Retired    Comment: Psychologist, occupational   Occupation: Veterinary surgeon  Tobacco Use   Smoking status: Never   Smokeless tobacco: Never  Substance and Sexual Activity   Alcohol use: No   Drug use: No   Sexual activity: Not on file    Comment: menarche age 24, 1st live birth age 27, P4, no HRT  Other Topics Concern   Not on file  Social History Narrative   Not on file   Social Drivers of Health   Financial Resource Strain: Not on file  Food Insecurity: Not on file  Transportation Needs: Not on file  Physical Activity: Not on file  Stress: Not on file  Social Connections: Not on file  Intimate Partner Violence: Not on file    Review of Systems: See HPI, otherwise negative ROS  Physical Exam: BP (!) 140/78   Pulse 83   Temp (!) 97.2 F (36.2 C)   Resp 14   Ht 5' 5 (1.651 m)   Wt 115.2 kg   SpO2 97%   BMI 42.27 kg/m  General:   Alert, cooperative in NAD Head:  Normocephalic and atraumatic. Respiratory:  Normal work of breathing. Cardiovascular:  RRR  Impression/Plan: [name] is here for cataract surgery.  Risks, benefits, limitations, and alternatives regarding cataract surgery have been reviewed with the patient.  Questions have been answered.  All parties agreeable.   Feliciano Bryan Ober, MD  11/24/2023, 7:09 AM

## 2023-11-24 NOTE — Op Note (Signed)
 OPERATIVE NOTE  [name] [id number] 11/24/2023   PREOPERATIVE DIAGNOSIS: Nuclear sclerotic cataract left eye. H25.12   POSTOPERATIVE DIAGNOSIS: Nuclear sclerotic cataract left eye. H25.12   PROCEDURE:  Phacoemulsification with posterior chamber intraocular lens placement of the left eye  Ultrasound time: Procedure(s): PHACOEMULSIFICATION, CATARACT, WITH IOL INSERTION 20.18, 01:48.9 (Left)  LENS:   Implant Name Type Inv. Item Serial No. Manufacturer Lot No. LRB No. Used Action  LENS IOL TECNIS MONO 22.0 - [id number] Intraocular Lens LENS IOL TECNIS MONO 22.0 [id number] [facility]  Left 1 Implanted      SURGEON:  Feliciano HERO. Enola, MD   ANESTHESIA:  Topical with tetracaine  drops, augmented with 1% preservative-free intracameral lidocaine .   COMPLICATIONS:  None.   DESCRIPTION OF PROCEDURE:  The patient was identified in the holding room and transported to the operating room and placed in the supine position under the operating microscope.  The left eye was identified as the operative eye, which was prepped and draped in the usual sterile ophthalmic fashion.   A 1 millimeter clear-corneal paracentesis was made inferotemporally. Preservative-free 1% lidocaine  mixed with 1:1,000 bisulfite-free aqueous solution of epinephrine  was injected into the anterior chamber. The anterior chamber was then filled with Viscoat viscoelastic. A 2.4 millimeter keratome was used to make a clear-corneal incision superotemporally. A curvilinear capsulorrhexis was made with a cystotome and capsulorrhexis forceps. Balanced salt solution was used to hydrodissect and hydrodelineate the nucleus. Phacoemulsification was then used to remove the lens nucleus and epinucleus. The remaining cortex was then removed using the irrigation and aspiration handpiece. Provisc was then placed into the capsular bag to distend it for lens placement. A +22.00 D DCB00 intraocular lens was then injected into the capsular bag. The  remaining viscoelastic was aspirated.   Wounds were hydrated with balanced salt solution.  The anterior chamber was inflated to a physiologic pressure with balanced salt solution.  No wound leaks were noted. Moxifloxacin  was injected intracamerally.  Timolol  and Brimonidine  drops were applied to the eye.  The patient was taken to the recovery room in stable condition without complications of anesthesia or surgery.  [employer] 11/24/2023, 8:21 AM

## 2023-11-24 NOTE — Anesthesia Postprocedure Evaluation (Signed)
 Anesthesia Post Note  Patient: [name]  Procedure(s) Performed: PHACOEMULSIFICATION, CATARACT, WITH IOL INSERTION 20.18, 01:48.9 (Left)  Patient location during evaluation: PACU Anesthesia Type: MAC Level of consciousness: awake and alert Pain management: pain level controlled Vital Signs Assessment: post-procedure vital signs reviewed and stable Respiratory status: spontaneous breathing, nonlabored ventilation, respiratory function stable and patient connected to nasal cannula oxygen Cardiovascular status: stable and blood pressure returned to baseline Postop Assessment: no apparent nausea or vomiting Anesthetic complications: no   No notable events documented.   Last Vitals:  Vitals:   11/24/23 0825 11/24/23 0826  BP: (!) 114/59 112/62  Pulse: 72 71  Resp: 18 14  Temp:  (!) 36.4 C  SpO2: 94% 95%    Last Pain:  Vitals:   11/24/23 0826  PainSc: 0-No pain                 Lachrisha Ziebarth C Rettie Laird

## 2023-11-25 DIAGNOSIS — H2511 Age-related nuclear cataract, right eye: Secondary | ICD-10-CM | POA: Diagnosis not present

## 2023-11-29 ENCOUNTER — Encounter: Payer: Self-pay | Admitting: Ophthalmology

## 2023-12-07 NOTE — Discharge Instructions (Signed)

## 2023-12-08 ENCOUNTER — Ambulatory Visit
Admission: RE | Admit: 2023-12-08 | Discharge: 2023-12-08 | Disposition: A | Discharge status: Home / Self Care | Attending: Ophthalmology | Admitting: Ophthalmology

## 2023-12-08 ENCOUNTER — Ambulatory Visit: Payer: Self-pay | Admitting: Anesthesiology

## 2023-12-08 ENCOUNTER — Encounter: Payer: Self-pay | Admitting: Ophthalmology

## 2023-12-08 ENCOUNTER — Encounter
Admission: RE | Disposition: A | Discharge status: Home / Self Care | Payer: Self-pay | Source: Home / Self Care | Attending: Ophthalmology

## 2023-12-08 ENCOUNTER — Other Ambulatory Visit: Payer: Self-pay

## 2023-12-08 DIAGNOSIS — I129 Hypertensive chronic kidney disease with stage 1 through stage 4 chronic kidney disease, or unspecified chronic kidney disease: Secondary | ICD-10-CM | POA: Insufficient documentation

## 2023-12-08 DIAGNOSIS — E1122 Type 2 diabetes mellitus with diabetic chronic kidney disease: Secondary | ICD-10-CM | POA: Insufficient documentation

## 2023-12-08 DIAGNOSIS — E1136 Type 2 diabetes mellitus with diabetic cataract: Secondary | ICD-10-CM | POA: Diagnosis not present

## 2023-12-08 DIAGNOSIS — Z7984 Long term (current) use of oral hypoglycemic drugs: Secondary | ICD-10-CM | POA: Diagnosis not present

## 2023-12-08 DIAGNOSIS — H2511 Age-related nuclear cataract, right eye: Secondary | ICD-10-CM | POA: Insufficient documentation

## 2023-12-08 DIAGNOSIS — N1831 Chronic kidney disease, stage 3a: Secondary | ICD-10-CM | POA: Insufficient documentation

## 2023-12-08 HISTORY — PX: CATARACT EXTRACTION W/PHACO: SHX586

## 2023-12-08 LAB — GLUCOSE, CAPILLARY: Glucose-Capillary: 103 mg/dL — ABNORMAL HIGH (ref 70–99)

## 2023-12-08 SURGERY — PHACOEMULSIFICATION, CATARACT, WITH IOL INSERTION
Anesthesia: Monitor Anesthesia Care | Site: Eye | Laterality: Right

## 2023-12-08 MED ORDER — ARMC OPHTHALMIC DILATING DROPS
OPHTHALMIC | Status: AC
  Filled 2023-12-08: qty 0.5

## 2023-12-08 MED ORDER — SIGHTPATH DOSE#1 BSS IO SOLN
INTRAOCULAR | Status: DC | PRN
  Administered 2023-12-08: 15 mL via INTRAOCULAR

## 2023-12-08 MED ORDER — BRIMONIDINE TARTRATE-TIMOLOL 0.2-0.5 % OP SOLN
OPHTHALMIC | Status: DC | PRN
  Administered 2023-12-08: 1 [drp] via OPHTHALMIC

## 2023-12-08 MED ORDER — FENTANYL CITRATE (PF) 100 MCG/2ML IJ SOLN
INTRAMUSCULAR | Status: DC | PRN
  Administered 2023-12-08: 50 ug via INTRAVENOUS

## 2023-12-08 MED ORDER — MIDAZOLAM HCL 2 MG/2ML IJ SOLN
INTRAMUSCULAR | Status: DC | PRN
Start: 2023-12-08 — End: 2023-12-08
  Administered 2023-12-08: 2 mg via INTRAVENOUS

## 2023-12-08 MED ORDER — LIDOCAINE HCL (PF) 2 % IJ SOLN
INTRAOCULAR | Status: DC | PRN
  Administered 2023-12-08: 4 mL via INTRAOCULAR

## 2023-12-08 MED ORDER — LACTATED RINGERS IV SOLN: INTRAVENOUS | Status: DC

## 2023-12-08 MED ORDER — SIGHTPATH DOSE#1 NA HYALUR & NA CHOND-NA HYALUR IO KIT
PACK | INTRAOCULAR | Status: DC | PRN
  Administered 2023-12-08: 1 via OPHTHALMIC

## 2023-12-08 MED ORDER — ARMC OPHTHALMIC DILATING DROPS
1.0000 | OPHTHALMIC | Status: DC | PRN
  Administered 2023-12-08 (×3): 1 via OPHTHALMIC

## 2023-12-08 MED ORDER — TETRACAINE HCL 0.5 % OP SOLN
OPHTHALMIC | Status: AC
  Filled 2023-12-08: qty 4

## 2023-12-08 MED ORDER — MOXIFLOXACIN HCL 0.5 % OP SOLN
OPHTHALMIC | Status: DC | PRN
  Administered 2023-12-08: .2 mL via OPHTHALMIC

## 2023-12-08 MED ORDER — MIDAZOLAM HCL 2 MG/2ML IJ SOLN
INTRAMUSCULAR | Status: AC
  Filled 2023-12-08: qty 2

## 2023-12-08 MED ORDER — TETRACAINE HCL 0.5 % OP SOLN
1.0000 [drp] | OPHTHALMIC | Status: DC | PRN
  Administered 2023-12-08 (×3): 1 [drp] via OPHTHALMIC

## 2023-12-08 MED ORDER — FENTANYL CITRATE (PF) 100 MCG/2ML IJ SOLN
INTRAMUSCULAR | Status: AC
  Filled 2023-12-08: qty 2

## 2023-12-08 MED ORDER — SIGHTPATH DOSE#1 BSS IO SOLN
INTRAOCULAR | Status: DC | PRN
  Administered 2023-12-08: 78 mL via OPHTHALMIC

## 2023-12-08 SURGICAL SUPPLY — 10 items
DISSECTOR HYDRO NUCLEUS 50X22 (MISCELLANEOUS) ×1 IMPLANT
DRSG TEGADERM 2-3/8X2-3/4 SM (GAUZE/BANDAGES/DRESSINGS) ×1 IMPLANT
FEE CATARACT SUITE SIGHTPATH (MISCELLANEOUS) ×1 IMPLANT
GLOVE BIOGEL PI IND STRL 8 (GLOVE) ×1 IMPLANT
GLOVE SURG LX STRL 7.5 STRW (GLOVE) ×1 IMPLANT
GLOVE SURG SYN 6.5 PF PI BL (GLOVE) ×1 IMPLANT
LENS IOL TECNIS MONO 23.0 (Intraocular Lens) IMPLANT
NDL FILTER BLUNT 18X1 1/2 (NEEDLE) ×1 IMPLANT
NEEDLE FILTER BLUNT 18X1 1/2 (NEEDLE) ×1 IMPLANT
SYR 3ML LL SCALE MARK (SYRINGE) ×1 IMPLANT

## 2023-12-08 NOTE — Transfer of Care (Signed)
 Immediate Anesthesia Transfer of Care Note  Patient: [name]  Procedure(s) Performed: PHACOEMULSIFICATION, CATARACT, WITH IOL INSERTION 9.80 01:06.2 (Right: Eye)  Patient Location: PACU  Anesthesia Type: MAC  Level of Consciousness: awake, alert  and patient cooperative  Airway and Oxygen Therapy: Patient Spontanous Breathing and Patient connected to supplemental oxygen  Post-op Assessment: Post-op Vital signs reviewed, Patient's Cardiovascular Status Stable, Respiratory Function Stable, Patent Airway and No signs of Nausea or vomiting  Post-op Vital Signs: Reviewed and stable  Complications: No notable events documented.

## 2023-12-08 NOTE — Anesthesia Postprocedure Evaluation (Signed)
 Anesthesia Post Note  Patient: [name]  Procedure(s) Performed: PHACOEMULSIFICATION, CATARACT, WITH IOL INSERTION 9.80 01:06.2 (Right: Eye)  Patient location during evaluation: PACU Anesthesia Type: MAC Level of consciousness: awake and alert Pain management: pain level controlled Vital Signs Assessment: post-procedure vital signs reviewed and stable Respiratory status: spontaneous breathing, nonlabored ventilation, respiratory function stable and patient connected to nasal cannula oxygen Cardiovascular status: stable and blood pressure returned to baseline Postop Assessment: no apparent nausea or vomiting Anesthetic complications: no   No notable events documented.   Last Vitals:  Vitals:   12/08/23 0932 12/08/23 0937  BP: 108/62 (!) 112/47  Pulse: 65 63  Resp: 11 13  Temp: (!) 36.4 C (!) 36.4 C  SpO2: 99% 98%    Last Pain:  Vitals:   12/08/23 0937  TempSrc:   PainSc: 0-No pain                 Donny JAYSON Mu

## 2023-12-08 NOTE — H&P (Signed)
 [facility]   Primary Care Physician:  Aisha Harvey, MD Ophthalmologist: Dr. Feliciano Ober  Pre-Procedure History & Physical: HPI:  [name] is a 78 y.o. female here for cataract surgery.   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   Morbid obesity with BMI of 40.0-44.9, adult (HCC)    Personal history of radiation therapy    Pneumonia    PONV (postoperative nausea and vomiting)    still got sick 1/14-may need scop patch   Pulmonary embolism (HCC)    Right knee DJD    Sleep apnea    uses a cpap   Subcutaneous hematoma left leg just outside knee joint 04/10/2012   Culture was done of this fluid collection is pending  Gram stain had white cells but no organism   Thyroid  disease    Type 2 diabetes mellitus with other circulatory complications (HCC)    Wears glasses    Wears partial dentures    upper partial    Past Surgical History:  Procedure Laterality Date   ABDOMINAL HYSTERECTOMY  1981   with unilateral slapingo-oophorectomy   BREAST LUMPECTOMY Left 2014   BREAST LUMPECTOMY WITH NEEDLE LOCALIZATION Left 12/28/2012   Procedure: BREAST LUMPECTOMY WITH NEEDLE LOCALIZATION;  Surgeon: Alm VEAR Angle, MD;  Location: [facility];  Service: General;  Laterality: Left;   CATARACT EXTRACTION W/PHACO Left 11/24/2023   Procedure: PHACOEMULSIFICATION, CATARACT, WITH IOL INSERTION 20.18, 01:48.9;  Surgeon: Ober Feliciano Hugger, MD;  Location: [facility];  Service: Ophthalmology;  Laterality: Left;   KNEE ARTHROPLASTY  2007   left   THYROIDECTOMY   2010   TOTAL KNEE ARTHROPLASTY  04/10/2012   Procedure: TOTAL KNEE ARTHROPLASTY;  Surgeon: Lamar DELENA Millman, MD;  Location: [facility] OR;  Service: Orthopedics;  Laterality: Right;    Prior to Admission medications   Medication Sig Start Date End Date Taking? Authorizing Provider  ACCU-CHEK AVIVA PLUS test strip  12/08/12   [provider]  ascorbic acid (VITAMIN C) 500 MG tablet Take 500 mg by mouth daily.    [provider]  diclofenac sodium (VOLTAREN) 1 % GEL Apply topically as needed.    Aisha Harvey, MD  Dulaglutide (TRULICITY) 1.5 MG/0.5ML SOAJ Inject into the skin.    [provider]  glimepiride (AMARYL) 4 MG tablet Take 4 mg by mouth daily with breakfast.    [provider]  levothyroxine  (SYNTHROID ) 125 MCG tablet Take 125 mcg by mouth daily before breakfast.    [provider]  lisinopril  (PRINIVIL ,ZESTRIL ) 10 MG tablet Take 10 mg by mouth daily. 02/01/12   [provider]  metformin  (FORTAMET ) 500 MG (OSM) 24 hr tablet Take 500 mg by mouth daily with breakfast.    [provider]  metFORMIN  (GLUMETZA ) 500 MG (MOD) 24 hr tablet Take 500 mg by mouth daily with breakfast.    [provider]  simvastatin  (ZOCOR ) 40 MG tablet Take 40 mg by mouth daily. 12/01/11  [provider]  zolpidem  (AMBIEN ) 10 MG tablet Take 1 tablet (10 mg total) by mouth at bedtime as needed for sleep. For sleep 04/12/12   Shepperson, Kirstin, PA-C    Allergies as of 10/28/2023   (No Known Allergies)    Family History  Problem Relation Age of Onset   Hypertension Mother    Heart attack Mother    Heart disease Mother        before age 53   Other Mother        varicose veins   Cancer Father        kidney   Diabetes Father    Heart disease Father    Hyperlipidemia Father    Hypertension Father    Diabetes Son    Hyperlipidemia Son    Hypertension Son    Other Son        varicose veins   Breast cancer Paternal Aunt      Social History   Socioeconomic History   Marital status: Married    Spouse name: Not on file   Number of children: 4   Years of education: Not on file   Highest education level: Not on file  Occupational History   Occupation: Retired    Comment: Psychologist, occupational   Occupation: Veterinary surgeon  Tobacco Use   Smoking status: Never   Smokeless tobacco: Never  Substance and Sexual Activity   Alcohol use: No   Drug use: No   Sexual activity: Not on file    Comment: menarche age 89, 1st live birth age 89, P4, no HRT  Other Topics Concern   Not on file  Social History Narrative   Not on file   Social Drivers of Health   Financial Resource Strain: Not on file  Food Insecurity: Not on file  Transportation Needs: Not on file  Physical Activity: Not on file  Stress: Not on file  Social Connections: Not on file  Intimate Partner Violence: Not on file    Review of Systems: See HPI, otherwise negative ROS  Physical Exam: Ht 5' 5 (1.651 m)   Wt 115.2 kg   BMI 42.26 kg/m  General:   Alert, cooperative in NAD Head:  Normocephalic and atraumatic. Respiratory:  Normal work of breathing. Cardiovascular:  RRR  Impression/Plan: [name] is here for cataract surgery.  Risks, benefits, limitations, and alternatives regarding cataract surgery have been reviewed with the patient.  Questions have been answered.  All parties agreeable.   Feliciano Bryan Ober, MD  12/08/2023, 7:12 AM

## 2023-12-08 NOTE — Anesthesia Preprocedure Evaluation (Addendum)
 Anesthesia Evaluation  Patient identified by MRN, date of birth, ID band Patient awake    Reviewed: Allergy & Precautions, H&P , NPO status , Patient's Chart, lab work & pertinent test results  Airway Mallampati: III  TM Distance: <3 FB    Comment: Likely very anterior airway if intubation were needed Dental   Partial Upper, Caps Multiple caps and crowns, many front, not sure which teeth:  :   Pulmonary neg pulmonary ROS          Cardiovascular hypertension,   07-07-04 echo Left ventricular size was normal.   -  Overall left ventricular systolic function was normal.   -  Left ventricular ejection fraction was estimated , range being 55         % to 65 %..   -  There were no left ventricular regional wall motion         abnormalities.   -  Left ventricular wall thickness was mildly increased.    Doppler interpretation(s):   -  There was an increased relative contribution of atrial         contraction to left ventricular filling.   -  Features were consistent with moderate diastolic dysfunction.   -  Doppler parameters were consistent with abnormal left ventricular         relaxation.    12-15-17 Nuclear stress EF: 66%. There was no ST segment deviation noted during stress. No T wave inversion was noted during stress. Defect 1: There is a small defect of mild severity present in the apex location. Findings consistent with ischemia. This is a low risk study.   Low risk stress nuclear study with a very small area of apical ischemia. Normal left ventricular regional and global systolic function.        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 Hypertension High cholesterol  Pulmonary embolism (HCC) Pneumonia  Bell's palsy Left knee DJD  Subcutaneous hematoma left leg just outside knee joint Breast cancer (HCC)  Thyroid  disease Wears glasses  Wears partial dentures Sleep apnea  PONV (postoperative nausea and vomiting) Edema leg  History of radiation therapy Personal history of radiation therapy Generalized anxiety disorder Morbid obesity with BMI of 40.0-44.9, adult (HCC)  Chronic kidney disease, stage 3a (HCC) Type 2 diabetes mellitus with other circulatory complications (HCC)     Reproductive/Obstetrics negative OB ROS                              Anesthesia Physical Anesthesia Plan  ASA: 3  Anesthesia Plan: MAC   Post-op Pain Management:    Induction: Intravenous  PONV Risk Score and Plan:   Airway Management Planned: Natural Airway and Nasal Cannula  Additional Equipment:   Intra-op Plan:   Post-operative Plan:   Informed Consent: I have reviewed the patients History and Physical, chart, labs and discussed the procedure including the risks, benefits and alternatives for the proposed anesthesia with the patient or authorized representative who has indicated his/her understanding and acceptance.     Dental Advisory  Given  Plan Discussed with: Anesthesiologist, CRNA and Surgeon  Anesthesia Plan Comments: (Patient consented for risks of anesthesia including but not limited to:  - adverse reactions to medications - damage to eyes, teeth, lips or other oral mucosa - nerve damage due to positioning  - sore throat or hoarseness - Damage to heart, brain, nerves, lungs, other parts of body or loss of life  Patient voiced understanding and assent.)         Anesthesia Quick Evaluation

## 2023-12-08 NOTE — Op Note (Signed)
 OPERATIVE NOTE  [name] [id number] 12/08/2023   PREOPERATIVE DIAGNOSIS: Nuclear sclerotic cataract right eye. H25.11   POSTOPERATIVE DIAGNOSIS: Nuclear sclerotic cataract right eye. H25.11   PROCEDURE:  Phacoemulsification with posterior chamber intraocular lens placement of the right eye  Ultrasound time: Procedure(s): PHACOEMULSIFICATION, CATARACT, WITH IOL INSERTION 9.80 01:06.2 (Right)  LENS:   Implant Name Type Inv. Item Serial No. Manufacturer Lot No. LRB No. Used Action  LENS IOL TECNIS MONO 23.0 - [id number] Intraocular Lens LENS IOL TECNIS MONO 23.0 [id number] [facility]  Right 1 Implanted      SURGEON:  Feliciano HERO. Enola, MD   ANESTHESIA:  Topical with tetracaine  drops, augmented with 1% preservative-free intracameral lidocaine .   COMPLICATIONS:  None.   DESCRIPTION OF PROCEDURE:  The patient was identified in the holding room and transported to the operating room and placed in the supine position under the operating microscope.  The right eye was identified as the operative eye, which was prepped and draped in the usual sterile ophthalmic fashion.   A 1 millimeter clear-corneal paracentesis was made superotemporally. Preservative-free 1% lidocaine  mixed with 1:1,000 bisulfite-free aqueous solution of epinephrine  was injected into the anterior chamber. The anterior chamber was then filled with Viscoat viscoelastic. A 2.4 millimeter keratome was used to make a clear-corneal incision inferotemporally. A curvilinear capsulorrhexis was made with a cystotome and capsulorrhexis forceps. Balanced salt solution was used to hydrodissect and hydrodelineate the nucleus. Phacoemulsification was then used to remove the lens nucleus and epinucleus. The remaining cortex was then removed using the irrigation and aspiration handpiece. Provisc was then placed into the capsular bag to distend it for lens placement. A +23.00 D DCB00 intraocular lens was then injected into the capsular bag. The  remaining viscoelastic was aspirated.   Wounds were hydrated with balanced salt solution.  The anterior chamber was inflated to a physiologic pressure with balanced salt solution.  No wound leaks were noted. Moxifloxacin  was injected intracamerally.  Timolol  and Brimonidine  drops were applied to the eye.  The patient was taken to the recovery room in stable condition without complications of anesthesia or surgery.  [employer] 12/08/2023, 9:30 AM

## 2023-12-09 ENCOUNTER — Encounter: Payer: Self-pay | Admitting: Ophthalmology

## 2023-12-28 DIAGNOSIS — Z961 Presence of intraocular lens: Secondary | ICD-10-CM | POA: Diagnosis not present
